# Patient Record
Sex: Male | Born: 1965
Health system: Southern US, Community
[De-identification: ages and names within clinical notes are randomized; demographics above are authoritative.]

## PROBLEM LIST (undated history)

## (undated) DIAGNOSIS — R7989 Other specified abnormal findings of blood chemistry: Secondary | ICD-10-CM

## (undated) DIAGNOSIS — K7581 Nonalcoholic steatohepatitis (NASH): Secondary | ICD-10-CM

## (undated) DIAGNOSIS — D72819 Decreased white blood cell count, unspecified: Secondary | ICD-10-CM

## (undated) DIAGNOSIS — K219 Gastro-esophageal reflux disease without esophagitis: Secondary | ICD-10-CM

## (undated) DIAGNOSIS — E785 Hyperlipidemia, unspecified: Secondary | ICD-10-CM

## (undated) DIAGNOSIS — I10 Essential (primary) hypertension: Secondary | ICD-10-CM

## (undated) DIAGNOSIS — K760 Fatty (change of) liver, not elsewhere classified: Secondary | ICD-10-CM

## (undated) DIAGNOSIS — I2699 Other pulmonary embolism without acute cor pulmonale: Secondary | ICD-10-CM

## (undated) HISTORY — DX: Essential (primary) hypertension: I10

## (undated) HISTORY — DX: Gastro-esophageal reflux disease without esophagitis: K21.9

## (undated) HISTORY — DX: Other specified abnormal findings of blood chemistry: R79.89

## (undated) HISTORY — DX: Fatty (change of) liver, not elsewhere classified: K76.0

## (undated) HISTORY — DX: Nonalcoholic steatohepatitis (NASH): K75.81

## (undated) HISTORY — DX: Hyperlipidemia, unspecified: E78.5

## (undated) HISTORY — DX: Other pulmonary embolism without acute cor pulmonale: I26.99

## (undated) HISTORY — DX: Decreased white blood cell count, unspecified: D72.819

---

## 2009-05-25 DIAGNOSIS — K760 Fatty (change of) liver, not elsewhere classified: Secondary | ICD-10-CM

## 2009-05-25 HISTORY — DX: Fatty (change of) liver, not elsewhere classified: K76.0

## 2011-01-06 DIAGNOSIS — B001 Herpesviral vesicular dermatitis: Secondary | ICD-10-CM | POA: Insufficient documentation

## 2012-07-20 ENCOUNTER — Ambulatory Visit (INDEPENDENT_AMBULATORY_CARE_PROVIDER_SITE_OTHER): Payer: BC Managed Care – PPO | Admitting: Internal Medicine

## 2012-07-20 ENCOUNTER — Encounter: Payer: Self-pay | Admitting: Internal Medicine

## 2012-07-20 VITALS — BP 106/64 | HR 58 | Temp 97.2°F | Resp 16 | Ht 69.0 in | Wt 223.0 lb

## 2012-07-20 DIAGNOSIS — Z23 Encounter for immunization: Secondary | ICD-10-CM

## 2012-07-20 DIAGNOSIS — E785 Hyperlipidemia, unspecified: Secondary | ICD-10-CM | POA: Insufficient documentation

## 2012-07-20 DIAGNOSIS — E7800 Pure hypercholesterolemia, unspecified: Secondary | ICD-10-CM

## 2012-07-20 DIAGNOSIS — I10 Essential (primary) hypertension: Secondary | ICD-10-CM

## 2012-07-20 NOTE — Progress Notes (Signed)
  Subjective:    Patient ID: Willie Foster, male    DOB: 1966-02-17, 47 y.o.   MRN: [id number]  Hypertension This is a chronic problem. The current episode started more than 1 year ago. The problem has been rapidly improving since onset. The problem is controlled. Pertinent negatives include no anxiety, blurred vision, chest pain, headaches, malaise/fatigue, neck pain, orthopnea, palpitations, peripheral edema, PND, shortness of breath or sweats. Past treatments include diuretics and beta blockers. The current treatment provides significant improvement. There are no compliance problems.       Review of Systems  Constitutional: Negative for fever, chills, malaise/fatigue, diaphoresis, activity change, appetite change, fatigue and unexpected weight change.  HENT: Negative.  Negative for neck pain.   Eyes: Negative.  Negative for blurred vision.  Respiratory: Negative for cough, chest tightness, shortness of breath, wheezing and stridor.   Cardiovascular: Negative for chest pain, palpitations, orthopnea and PND.  Gastrointestinal: Negative for nausea, vomiting, abdominal pain, diarrhea and constipation.  Endocrine: Negative.   Genitourinary: Negative.   Musculoskeletal: Negative for myalgias, back pain, joint swelling, arthralgias and gait problem.  Skin: Negative for color change, pallor, rash and wound.  Allergic/Immunologic: Negative.   Neurological: Negative for dizziness, speech difficulty, weakness, light-headedness and headaches.  Hematological: Negative for adenopathy. Does not bruise/bleed easily.  Psychiatric/Behavioral: Negative.        Objective:   Physical Exam  Vitals reviewed. Constitutional: He is oriented to person, place, and time. He appears well-developed and well-nourished. No distress.  HENT:  Head: Normocephalic and atraumatic.  Mouth/Throat: No oropharyngeal exudate.  Eyes: Conjunctivae are normal. Right eye exhibits no discharge. Left eye exhibits no discharge.  No scleral icterus.  Neck: Normal range of motion. Neck supple. No JVD present. No tracheal deviation present. No thyromegaly present.  Cardiovascular: Normal rate, regular rhythm, normal heart sounds and intact distal pulses.  Exam reveals no gallop and no friction rub.   No murmur heard. Pulmonary/Chest: Effort normal and breath sounds normal. No stridor. No respiratory distress. He has no wheezes. He has no rales. He exhibits no tenderness.  Abdominal: Soft. Bowel sounds are normal. He exhibits no distension and no mass. There is no tenderness. There is no rebound and no guarding.  Musculoskeletal: Normal range of motion. He exhibits no edema and no tenderness.  Lymphadenopathy:    He has no cervical adenopathy.  Neurological: He is oriented to person, place, and time.  Skin: Skin is warm and dry. No rash noted. He is not diaphoretic. No erythema. No pallor.  Psychiatric: He has a normal mood and affect. His behavior is normal. Judgment and thought content normal.     No results found for this basename: WBC, HGB, HCT, PLT, GLUCOSE, CHOL, TRIG, HDL, LDLDIRECT, LDLCALC, ALT, AST, NA, K, CL, CREATININE, BUN, CO2, TSH, PSA, INR, GLUF, HGBA1C, MICROALBUR       Assessment & Plan:

## 2012-07-20 NOTE — Assessment & Plan Note (Signed)
His BP is well controlled 

## 2012-07-20 NOTE — Patient Instructions (Signed)

## 2012-07-20 NOTE — Assessment & Plan Note (Signed)
He is doing well on lipitor

## 2012-09-15 ENCOUNTER — Ambulatory Visit (INDEPENDENT_AMBULATORY_CARE_PROVIDER_SITE_OTHER): Payer: BC Managed Care – PPO | Admitting: Internal Medicine

## 2012-09-15 ENCOUNTER — Other Ambulatory Visit (INDEPENDENT_AMBULATORY_CARE_PROVIDER_SITE_OTHER): Payer: BC Managed Care – PPO

## 2012-09-15 ENCOUNTER — Encounter: Payer: Self-pay | Admitting: Internal Medicine

## 2012-09-15 VITALS — BP 104/70 | HR 58 | Temp 97.8°F | Resp 16 | Ht 70.0 in | Wt 229.0 lb

## 2012-09-15 DIAGNOSIS — T502X5A Adverse effect of carbonic-anhydrase inhibitors, benzothiadiazides and other diuretics, initial encounter: Secondary | ICD-10-CM

## 2012-09-15 DIAGNOSIS — K921 Melena: Secondary | ICD-10-CM

## 2012-09-15 DIAGNOSIS — I10 Essential (primary) hypertension: Secondary | ICD-10-CM

## 2012-09-15 DIAGNOSIS — E7800 Pure hypercholesterolemia, unspecified: Secondary | ICD-10-CM

## 2012-09-15 DIAGNOSIS — N400 Enlarged prostate without lower urinary tract symptoms: Secondary | ICD-10-CM

## 2012-09-15 DIAGNOSIS — R079 Chest pain, unspecified: Secondary | ICD-10-CM

## 2012-09-15 DIAGNOSIS — E876 Hypokalemia: Secondary | ICD-10-CM

## 2012-09-15 LAB — URINALYSIS, ROUTINE W REFLEX MICROSCOPIC
Bilirubin Urine: NEGATIVE
Ketones, ur: NEGATIVE
Nitrite: NEGATIVE
Total Protein, Urine: NEGATIVE
pH: 6 (ref 5.0–8.0)

## 2012-09-15 LAB — CBC WITH DIFFERENTIAL/PLATELET
Basophils Absolute: 0 10*3/uL (ref 0.0–0.1)
Eosinophils Relative: 1.5 % (ref 0.0–5.0)
HCT: 43.5 % (ref 39.0–52.0)
Hemoglobin: 14.7 g/dL (ref 13.0–17.0)
Lymphocytes Relative: 32.9 % (ref 12.0–46.0)
Lymphs Abs: 1.6 10*3/uL (ref 0.7–4.0)
Monocytes Relative: 13.5 % — ABNORMAL HIGH (ref 3.0–12.0)
Neutro Abs: 2.5 10*3/uL (ref 1.4–7.7)
RBC: 5.52 Mil/uL (ref 4.22–5.81)
WBC: 4.7 10*3/uL (ref 4.5–10.5)

## 2012-09-15 LAB — COMPREHENSIVE METABOLIC PANEL
BUN: 17 mg/dL (ref 6–23)
CO2: 35 mEq/L — ABNORMAL HIGH (ref 19–32)
Calcium: 9.5 mg/dL (ref 8.4–10.5)
Chloride: 98 mEq/L (ref 96–112)
Creatinine, Ser: 1 mg/dL (ref 0.4–1.5)
GFR: 80.77 mL/min (ref >60.00)
Glucose, Bld: 85 mg/dL (ref 70–99)
Total Bilirubin: 0.6 mg/dL (ref 0.3–1.2)

## 2012-09-15 LAB — PSA: PSA: 0.68 ng/mL (ref 0.10–4.00)

## 2012-09-15 LAB — CARDIAC PANEL
CK-MB: 1.9 ng/mL (ref 0.3–4.0)
Relative Index: 0.7 calc (ref 0.0–2.5)

## 2012-09-15 LAB — LIPID PANEL
Cholesterol: 163 mg/dL (ref 0–200)
HDL: 46.1 mg/dL (ref >39.00)
LDL Cholesterol: 79 mg/dL (ref 0–99)
VLDL: 37.6 mg/dL (ref 0.0–40.0)

## 2012-09-15 LAB — TSH: TSH: 0.8 u[IU]/mL (ref 0.35–5.50)

## 2012-09-15 NOTE — Assessment & Plan Note (Signed)
GI referral (? The need for colonoscopy)

## 2012-09-15 NOTE — Progress Notes (Signed)
Subjective:    Patient ID: Willie Foster, male    DOB: 08-Jun-1965, 47 y.o.   MRN: [id number]  Chest Pain  This is a new problem. Episode onset: one week ago. The onset quality is gradual. The problem occurs intermittently. The problem has been unchanged. The pain is present in the lateral region (right lower anterior chest wall area). The pain is at a severity of 1/10. The pain is mild. The quality of the pain is described as dull. The pain does not radiate. Pertinent negatives include no abdominal pain, back pain, claudication, cough, diaphoresis, dizziness, exertional chest pressure, fever, headaches, hemoptysis, irregular heartbeat, leg pain, lower extremity edema, malaise/fatigue, nausea, near-syncope, numbness, orthopnea, palpitations, PND, shortness of breath, sputum production, syncope, vomiting or weakness. The pain is aggravated by nothing. He has tried nothing for the symptoms.      Review of Systems  Constitutional: Negative.  Negative for fever, chills, malaise/fatigue, diaphoresis, activity change, appetite change, fatigue and unexpected weight change.  HENT: Negative.   Eyes: Negative.   Respiratory: Negative.  Negative for cough, hemoptysis, sputum production, choking, chest tightness, shortness of breath, wheezing and stridor.   Cardiovascular: Positive for chest pain. Negative for palpitations, orthopnea, claudication, leg swelling, syncope, PND and near-syncope.  Gastrointestinal: Positive for anal bleeding. Negative for nausea, vomiting, abdominal pain, diarrhea, constipation, abdominal distention and rectal pain.  Endocrine: Negative.   Genitourinary: Negative.  Negative for hematuria, penile swelling, scrotal swelling and testicular pain.  Musculoskeletal: Negative.  Negative for myalgias, back pain, joint swelling and gait problem.  Skin: Negative.   Allergic/Immunologic: Negative.   Neurological: Negative.  Negative for dizziness, syncope, weakness, light-headedness,  numbness and headaches.  Hematological: Negative.  Negative for adenopathy. Does not bruise/bleed easily.  Psychiatric/Behavioral: Negative.        Objective:   Physical Exam  Vitals reviewed. Constitutional: He is oriented to person, place, and time. He appears well-developed and well-nourished. No distress.  HENT:  Head: Normocephalic and atraumatic.  Mouth/Throat: Oropharynx is clear and moist. No oropharyngeal exudate.  Eyes: Conjunctivae are normal. Right eye exhibits no discharge. Left eye exhibits no discharge. No scleral icterus.  Neck: Normal range of motion. Neck supple. No JVD present. No tracheal deviation present. No thyromegaly present.  Cardiovascular: Normal rate, regular rhythm, normal heart sounds and intact distal pulses.  Exam reveals no gallop and no friction rub.   No murmur heard. Pulmonary/Chest: Effort normal and breath sounds normal. No accessory muscle usage or stridor. Not tachypneic. No respiratory distress. He has no decreased breath sounds. He has no wheezes. He has no rales. Chest wall is not dull to percussion. He exhibits no mass, no tenderness, no bony tenderness, no laceration, no crepitus, no edema, no deformity, no swelling and no retraction. Right breast exhibits no inverted nipple, no mass, no nipple discharge, no skin change and no tenderness. Left breast exhibits no mass, no nipple discharge, no skin change and no tenderness. Breasts are symmetrical.  Abdominal: Soft. Bowel sounds are normal. He exhibits no distension and no mass. There is no tenderness. There is no rebound and no guarding. Hernia confirmed negative in the right inguinal area and confirmed negative in the left inguinal area.  Genitourinary: Rectum normal, testes normal and penis normal. Rectal exam shows no external hemorrhoid, no internal hemorrhoid, no fissure, no mass, no tenderness and anal tone normal. Guaiac negative stool. Prostate is enlarged (1+ smooth symm BPH). Prostate is not  tender. Right testis shows no mass, no swelling and no  tenderness. Right testis is descended. Left testis shows no mass, no swelling and no tenderness. Left testis is descended. Circumcised. No phimosis, paraphimosis, hypospadias, penile erythema or penile tenderness. No discharge found.  Musculoskeletal: Normal range of motion. He exhibits no edema and no tenderness.  Lymphadenopathy:    He has no cervical adenopathy.       Right: No inguinal adenopathy present.       Left: No inguinal adenopathy present.  Neurological: He is oriented to person, place, and time.  Skin: Skin is warm and dry. No rash noted. He is not diaphoretic. No erythema. No pallor.  Psychiatric: He has a normal mood and affect. His behavior is normal. Judgment and thought content normal.     No results found for this basename: WBC, HGB, HCT, PLT, GLUCOSE, CHOL, TRIG, HDL, LDLDIRECT, LDLCALC, ALT, AST, NA, K, CL, CREATININE, BUN, CO2, TSH, PSA, INR, GLUF, HGBA1C, MICROALBUR       Assessment & Plan:

## 2012-09-15 NOTE — Assessment & Plan Note (Signed)
His pain is atypical The EKG shows some artifact but it is otherwise normal I will check some cardiac enzymes today

## 2012-09-15 NOTE — Assessment & Plan Note (Signed)
FLP and CMP today

## 2012-09-15 NOTE — Patient Instructions (Addendum)
Chest Pain (Nonspecific) It is often hard to give a specific diagnosis for the cause of chest pain. There is always a chance that your pain could be related to something serious, such as a heart attack or a blood clot in the lungs. You need to follow up with your caregiver for further evaluation. CAUSES   Heartburn.  Pneumonia or bronchitis.  Anxiety or stress.  Inflammation around your heart (pericarditis) or lung (pleuritis or pleurisy).  A blood clot in the lung.  A collapsed lung (pneumothorax). It can develop suddenly on its own (spontaneous pneumothorax) or from injury (trauma) to the chest.  Shingles infection (herpes zoster virus). The chest wall is composed of bones, muscles, and cartilage. Any of these can be the source of the pain.  The bones can be bruised by injury.  The muscles or cartilage can be strained by coughing or overwork.  The cartilage can be affected by inflammation and become sore (costochondritis). DIAGNOSIS  Lab tests or other studies, such as X-rays, electrocardiography, stress testing, or cardiac imaging, may be needed to find the cause of your pain.  TREATMENT   Treatment depends on what may be causing your chest pain. Treatment may include:  Acid blockers for heartburn.  Anti-inflammatory medicine.  Pain medicine for inflammatory conditions.  Antibiotics if an infection is present.  You may be advised to change lifestyle habits. This includes stopping smoking and avoiding alcohol, caffeine, and chocolate.  You may be advised to keep your head raised (elevated) when sleeping. This reduces the chance of acid going backward from your stomach into your esophagus.  Most of the time, nonspecific chest pain will improve within 2 to 3 days with rest and mild pain medicine. HOME CARE INSTRUCTIONS   If antibiotics were prescribed, take your antibiotics as directed. Finish them even if you start to feel better.  For the next few days, avoid physical  activities that bring on chest pain. Continue physical activities as directed.  Do not smoke.  Avoid drinking alcohol.  Only take over-the-counter or prescription medicine for pain, discomfort, or fever as directed by your caregiver.  Follow your caregiver's suggestions for further testing if your chest pain does not go away.  Keep any follow-up appointments you made. If you do not go to an appointment, you could develop lasting (chronic) problems with pain. If there is any problem keeping an appointment, you must call to reschedule. SEEK MEDICAL CARE IF:   You think you are having problems from the medicine you are taking. Read your medicine instructions carefully.  Your chest pain does not go away, even after treatment.  You develop a rash with blisters on your chest. SEEK IMMEDIATE MEDICAL CARE IF:   You have increased chest pain or pain that spreads to your arm, neck, jaw, back, or abdomen.  You develop shortness of breath, an increasing cough, or you are coughing up blood.  You have severe back or abdominal pain, feel nauseous, or vomit.  You develop severe weakness, fainting, or chills.  You have a fever. THIS IS AN EMERGENCY. Do not wait to see if the pain will go away. Get medical help at once. Call your local emergency services (911 in [place] Do not drive yourself to the hospital. MAKE SURE YOU:   Understand these instructions.  Will watch your condition.  Will get help right away if you are not doing well or get worse. Document Released: 02/18/2005 Document Revised: 08/03/2011 Document Reviewed: 12/15/2007 [employer] Patient Information 2013 [employer].  

## 2012-09-15 NOTE — Assessment & Plan Note (Signed)
PSA today

## 2012-09-15 NOTE — Assessment & Plan Note (Signed)
His BP is well controlled Today I will check his lytes and renal function 

## 2012-09-16 ENCOUNTER — Telehealth: Payer: Self-pay | Admitting: Internal Medicine

## 2012-09-16 ENCOUNTER — Encounter: Payer: Self-pay | Admitting: Internal Medicine

## 2012-09-16 DIAGNOSIS — T502X5A Adverse effect of carbonic-anhydrase inhibitors, benzothiadiazides and other diuretics, initial encounter: Secondary | ICD-10-CM | POA: Insufficient documentation

## 2012-09-16 DIAGNOSIS — E876 Hypokalemia: Secondary | ICD-10-CM | POA: Insufficient documentation

## 2012-09-16 MED ORDER — POTASSIUM CHLORIDE CRYS ER 20 MEQ PO TBCR: 20.0000 meq | EXTENDED_RELEASE_TABLET | Freq: Two times a day (BID) | ORAL | Status: DC

## 2012-09-16 NOTE — Telephone Encounter (Signed)
Caller: [place]: Randell Loop Patient: Willie, Foster DOB: [birth year] Phone: [phone] Reason for Call: Willie Foster is calling from [place] regarding a Troponin I ordered on Willie, Foster by Dr. Marcello Moores Jones4/24/2014 3:22:00 PM. The results were Normal 0.01.

## 2012-09-16 NOTE — Addendum Note (Signed)
Addended by: [name] on: 09/16/2012 07:45 AM   Modules accepted: Orders, Medications

## 2012-09-20 ENCOUNTER — Other Ambulatory Visit: Payer: Self-pay | Admitting: Internal Medicine

## 2012-09-20 DIAGNOSIS — E7800 Pure hypercholesterolemia, unspecified: Secondary | ICD-10-CM

## 2012-09-20 DIAGNOSIS — I10 Essential (primary) hypertension: Secondary | ICD-10-CM

## 2012-10-23 DIAGNOSIS — I2699 Other pulmonary embolism without acute cor pulmonale: Secondary | ICD-10-CM

## 2012-10-23 HISTORY — DX: Other pulmonary embolism without acute cor pulmonale: I26.99

## 2012-11-13 ENCOUNTER — Emergency Department (HOSPITAL_COMMUNITY)
Admission: EM | Admit: 2012-11-13 | Discharge: 2012-11-13 | Disposition: A | Discharge status: Nursing Home (Includes ICF) | Payer: BC Managed Care – PPO | Source: Home / Self Care | Attending: Family Medicine | Admitting: Family Medicine

## 2012-11-13 ENCOUNTER — Encounter (HOSPITAL_COMMUNITY): Payer: Self-pay | Admitting: *Deleted

## 2012-11-13 ENCOUNTER — Encounter (HOSPITAL_COMMUNITY): Payer: Self-pay | Admitting: Nurse Practitioner

## 2012-11-13 ENCOUNTER — Emergency Department (HOSPITAL_COMMUNITY): Payer: BC Managed Care – PPO

## 2012-11-13 ENCOUNTER — Emergency Department (INDEPENDENT_AMBULATORY_CARE_PROVIDER_SITE_OTHER): Payer: BC Managed Care – PPO

## 2012-11-13 ENCOUNTER — Inpatient Hospital Stay (HOSPITAL_COMMUNITY)
Admission: EM | Admit: 2012-11-13 | Discharge: 2012-11-15 | DRG: 078 | Disposition: A | Discharge status: Home / Self Care | Payer: BC Managed Care – PPO | Attending: Internal Medicine | Admitting: Internal Medicine

## 2012-11-13 DIAGNOSIS — E785 Hyperlipidemia, unspecified: Secondary | ICD-10-CM | POA: Diagnosis present

## 2012-11-13 DIAGNOSIS — R0789 Other chest pain: Secondary | ICD-10-CM

## 2012-11-13 DIAGNOSIS — I2699 Other pulmonary embolism without acute cor pulmonale: Principal | ICD-10-CM

## 2012-11-13 DIAGNOSIS — T502X5A Adverse effect of carbonic-anhydrase inhibitors, benzothiadiazides and other diuretics, initial encounter: Secondary | ICD-10-CM | POA: Diagnosis present

## 2012-11-13 DIAGNOSIS — N400 Enlarged prostate without lower urinary tract symptoms: Secondary | ICD-10-CM | POA: Diagnosis present

## 2012-11-13 DIAGNOSIS — E876 Hypokalemia: Secondary | ICD-10-CM

## 2012-11-13 DIAGNOSIS — E7800 Pure hypercholesterolemia, unspecified: Secondary | ICD-10-CM

## 2012-11-13 DIAGNOSIS — K7689 Other specified diseases of liver: Secondary | ICD-10-CM | POA: Diagnosis present

## 2012-11-13 DIAGNOSIS — R079 Chest pain, unspecified: Secondary | ICD-10-CM | POA: Diagnosis present

## 2012-11-13 DIAGNOSIS — I10 Essential (primary) hypertension: Secondary | ICD-10-CM

## 2012-11-13 LAB — BASIC METABOLIC PANEL
Calcium: 10.5 mg/dL (ref 8.4–10.5)
Creatinine, Ser: 1.22 mg/dL (ref 0.50–1.35)
GFR calc Af Amer: 79 mL/min — ABNORMAL LOW (ref >90)

## 2012-11-13 LAB — CBC
MCH: 26.8 pg (ref 26.0–34.0)
MCV: 75.9 fL — ABNORMAL LOW (ref 78.0–100.0)
Platelets: 160 10*3/uL (ref 150–400)
RDW: 14 % (ref 11.5–15.5)

## 2012-11-13 LAB — PRO B NATRIURETIC PEPTIDE: Pro B Natriuretic peptide (BNP): 32.2 pg/mL (ref 0–125)

## 2012-11-13 MED ORDER — ATENOLOL 50 MG PO TABS
50.0000 mg | ORAL_TABLET | Freq: Every day | ORAL | Status: DC
  Administered 2012-11-14 – 2012-11-15 (×2): 50 mg via ORAL
  Filled 2012-11-13 (×2): qty 1

## 2012-11-13 MED ORDER — ATENOLOL-CHLORTHALIDONE 50-25 MG PO TABS: 1.0000 | ORAL_TABLET | Freq: Every day | ORAL | Status: DC

## 2012-11-13 MED ORDER — CHLORTHALIDONE 25 MG PO TABS: 25.0000 mg | ORAL_TABLET | Freq: Every day | ORAL | Status: DC

## 2012-11-13 MED ORDER — ACETAMINOPHEN 325 MG PO TABS
650.0000 mg | ORAL_TABLET | Freq: Four times a day (QID) | ORAL | Status: DC | PRN
  Administered 2012-11-13: 650 mg via ORAL
  Filled 2012-11-13: qty 2

## 2012-11-13 MED ORDER — IOHEXOL 350 MG/ML SOLN
80.0000 mL | Freq: Once | INTRAVENOUS | Status: AC | PRN
  Administered 2012-11-13: 80 mL via INTRAVENOUS

## 2012-11-13 MED ORDER — ONDANSETRON HCL 4 MG/2ML IJ SOLN: 4.0000 mg | Freq: Four times a day (QID) | INTRAMUSCULAR | Status: DC | PRN

## 2012-11-13 MED ORDER — HEPARIN BOLUS VIA INFUSION
6000.0000 [IU] | Freq: Once | INTRAVENOUS | Status: AC
  Administered 2012-11-13: 6000 [IU] via INTRAVENOUS

## 2012-11-13 MED ORDER — POTASSIUM CHLORIDE CRYS ER 20 MEQ PO TBCR
20.0000 meq | EXTENDED_RELEASE_TABLET | Freq: Two times a day (BID) | ORAL | Status: DC
  Administered 2012-11-13: 20 meq via ORAL
  Filled 2012-11-13: qty 1

## 2012-11-13 MED ORDER — HEPARIN (PORCINE) IN NACL 100-0.45 UNIT/ML-% IJ SOLN
1400.0000 [IU]/h | INTRAMUSCULAR | Status: AC
  Administered 2012-11-13: 1600 [IU]/h via INTRAVENOUS
  Administered 2012-11-14: 1400 [IU]/h via INTRAVENOUS
  Filled 2012-11-13 (×3): qty 250

## 2012-11-13 MED ORDER — LORATADINE 10 MG PO TABS
10.0000 mg | ORAL_TABLET | Freq: Every day | ORAL | Status: DC
  Administered 2012-11-14 – 2012-11-15 (×2): 10 mg via ORAL
  Filled 2012-11-13 (×2): qty 1

## 2012-11-13 MED ORDER — ONDANSETRON HCL 4 MG PO TABS: 4.0000 mg | ORAL_TABLET | Freq: Four times a day (QID) | ORAL | Status: DC | PRN

## 2012-11-13 MED ORDER — ACETAMINOPHEN 650 MG RE SUPP: 650.0000 mg | Freq: Four times a day (QID) | RECTAL | Status: DC | PRN

## 2012-11-13 MED ORDER — SODIUM CHLORIDE 0.9 % IV SOLN
INTRAVENOUS | Status: AC
  Administered 2012-11-14: 10:00:00 via INTRAVENOUS

## 2012-11-13 MED ORDER — SODIUM CHLORIDE 0.9 % IJ SOLN
3.0000 mL | Freq: Two times a day (BID) | INTRAMUSCULAR | Status: DC
  Administered 2012-11-15: 3 mL via INTRAVENOUS

## 2012-11-13 NOTE — ED Provider Notes (Signed)
History     CSN: [id number]  Arrival date & time 11/13/12  1144   None     Chief Complaint  Patient presents with  . Muscle Pain  . Chest Pain    (Consider location/radiation/quality/duration/timing/severity/associated sxs/prior treatment) Patient is a 47 y.o. male presenting with chest pain. The history is provided by the patient.  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 Hyperlipidemia   . Hypertension   . Fatty liver disease, nonalcoholic 8527    History reviewed. No pertinent past surgical history.  Family History  Problem Relation Age of Onset  . Cancer Paternal Grandfather     Breast and Lung Cancer  . Hypertension Father   . Alcohol abuse Neg Hx   . COPD Neg Hx   . Diabetes Neg Hx   . Drug abuse Neg Hx   . Early death Neg Hx   . Heart disease Neg Hx   . Hyperlipidemia Neg Hx   . Kidney disease Neg Hx   . Stroke Neg Hx     History  Substance Use Topics  . Smoking status: Never Smoker   . Smokeless tobacco: Never Used  . Alcohol Use: 3.0 oz/week    2 Glasses of wine, 2 Cans of beer, 1 Shots of liquor per week      Review of Systems  Constitutional: Negative.   Respiratory: Negative for cough, chest tightness and shortness of breath.   Cardiovascular: Positive for chest pain. Negative for palpitations and leg swelling.  Gastrointestinal: Negative.     Allergies  Lipitor  Home Medications   Current Outpatient Rx  Name  Route  Sig  Dispense  Refill  . atenolol-chlorthalidone (TENORETIC) 50-25 MG per tablet   Oral   Take 1 tablet by mouth daily.        . potassium chloride SA (K-DUR,KLOR-CON) 20 MEQ tablet   Oral   Take 1 tablet (20 mEq total) by mouth 2 (two) times daily.   60 tablet   5   . acyclovir (ZOVIRAX) 400 MG tablet   Oral   Take 1 tablet by mouth 3 (three) times daily as needed.           BP 114/81  Pulse 67  Temp(Src) 98 F (36.7 C) (Oral)  Resp 18  SpO2 96%  Physical Exam  Nursing note and vitals reviewed. Constitutional: He is oriented to person, place, and time. He appears well-developed and well-nourished.  Neck: Normal range of motion. Neck supple.  Cardiovascular: Normal rate, regular rhythm, normal heart sounds and intact distal pulses.   Pulmonary/Chest: Effort normal and breath sounds normal. He exhibits tenderness.  Abdominal: Soft. Bowel sounds are normal.  Lymphadenopathy:    He has no cervical adenopathy.  Neurological: He is alert and oriented to person, place, and time.  Skin: Skin is warm and dry.    ED Course  Procedures (including critical care time)  Labs Reviewed - No data to display Dg Chest 2 View  11/13/2012   *RADIOLOGY REPORT*  Clinical Data: Right-sided chest  pain  CHEST - 2 VIEW  Comparison: None  Findings: Normal heart size.  Decreased lung volumes.  No pleural effusion identified.  There is atelectasis involving the both lung bases right greater than left.  Right middle lobe infiltrate is also suspected based upon opacity seen on the lateral radiograph.  IMPRESSION:  1.  Lungs are suboptimally inflated. 2.  Bibasilar atelectasis. 3.  Based on the lateral radiograph I suspect there may be a right middle lobe infiltrate.   Original Report Authenticated By: Kerby Moors, M.D.     1. Chest pain, atypical       MDM  X-rays reviewed and report per radiologist.  Sent for eval of right sided cp and abnl cxr, r/o PE, inconsistent with infectious process.         Billy Fischer, MD 11/13/12 1328

## 2012-11-13 NOTE — ED Provider Notes (Signed)
History     CSN: [id number]  Arrival date & time 11/13/12  1344   None     Chief Complaint  Patient presents with  . Chest Pain    (Consider location/radiation/quality/duration/timing/severity/associated sxs/prior treatment) Patient is a 47 y.o. male presenting with chest pain.  Chest Pain Pain location:  Substernal area Pain quality: sharp   Pain radiates to:  R shoulder Pain radiates to the back: yes   Pain severity:  Moderate Onset quality:  Gradual Duration:  2 days Timing:  Constant Progression:  Unchanged Chronicity:  Recurrent Context: breathing and at rest   Context: not lifting and no movement   Relieved by:  Antacids (NSAIDs) Worsened by:  Nothing tried Associated symptoms: shortness of breath   Associated symptoms: no abdominal pain, no anxiety, no back pain, no cough, no dizziness, no fever, no headache, no heartburn, no lower extremity edema, no nausea, no orthopnea, no palpitations, no PND and not vomiting   Risk factors: high cholesterol and hypertension     Past Medical History  Diagnosis Date  . Hyperlipidemia   . Hypertension   . Fatty liver disease, nonalcoholic 1093    History reviewed. No pertinent past surgical history.  Family History  Problem Relation Age of Onset  . Cancer Paternal Grandfather     Breast and Lung Cancer  . Hypertension Father   . Alcohol abuse Neg Hx   . COPD Neg Hx   . Diabetes Neg Hx   . Drug abuse Neg Hx   . Early death Neg Hx   . Heart disease Neg Hx   . Hyperlipidemia Neg Hx   . Kidney disease Neg Hx   . Stroke Neg Hx     History  Substance Use Topics  . Smoking status: Never Smoker   . Smokeless tobacco: Never Used  . Alcohol Use: 3.0 oz/week    2 Glasses of wine, 2 Cans of beer, 1 Shots of liquor per week      Review of Systems  Constitutional: Negative for fever and chills.  HENT: Negative for congestion, sore throat and rhinorrhea.   Eyes: Negative for photophobia and visual disturbance.   Respiratory: Positive for shortness of breath. Negative for cough.   Cardiovascular: Positive for chest pain. Negative for palpitations, orthopnea, leg swelling and PND.  Gastrointestinal: Negative for heartburn, nausea, vomiting, abdominal pain, diarrhea and constipation.  Endocrine: Negative for polydipsia and polyuria.  Genitourinary: Negative for dysuria and hematuria.  Musculoskeletal: Negative for back pain and arthralgias.  Skin: Negative for color change and rash.  Neurological: Negative for dizziness, syncope, light-headedness and headaches.  Hematological: Negative for adenopathy. Does not bruise/bleed easily.  All other systems reviewed and are negative.    Allergies  Lipitor  Home Medications   Current Outpatient Rx  Name  Route  Sig  Dispense  Refill  . acyclovir (ZOVIRAX) 400 MG tablet   Oral   Take 1 tablet by mouth 3 (three) times daily as needed (fever blisters).          Marland Kitchen atenolol-chlorthalidone (TENORETIC) 50-25 MG per tablet   Oral   Take 1 tablet by mouth daily.         . Cetirizine HCl (ZYRTEC ALLERGY) 10 MG CAPS   Oral   Take 10 mg by mouth daily.         . naproxen sodium (ANAPROX) 220 MG tablet   Oral   Take 440 mg by mouth every 6 (six) hours as needed (pain).         Marland Kitchen  potassium chloride SA (K-DUR,KLOR-CON) 20 MEQ tablet   Oral   Take 1 tablet (20 mEq total) by mouth 2 (two) times daily.   60 tablet   5     BP 135/62  Pulse 79  Temp(Src) 98.1 F (36.7 C) (Oral)  Resp 16  Ht 5' 9"  (1.753 m)  Wt 215 lb (97.523 kg)  BMI 31.74 kg/m2  SpO2 98%  Physical Exam  Vitals reviewed. Constitutional: He is oriented to person, place, and time. He appears well-developed and well-nourished.  HENT:  Head: Normocephalic and atraumatic.  Eyes: Conjunctivae and EOM are normal.  Neck: Normal range of motion. Neck supple.  Cardiovascular: Normal rate, regular rhythm and normal heart sounds.   Pulmonary/Chest: Effort normal and breath sounds  normal. No respiratory distress. He exhibits no tenderness.  Abdominal: He exhibits no distension. There is no tenderness. There is no rebound and no guarding.  Musculoskeletal: Normal range of motion.  Neurological: He is alert and oriented to person, place, and time.  Skin: Skin is warm and dry.    ED Course  Procedures (including critical care time)  Labs Reviewed  CBC - Abnormal; Notable for the following:    MCV 75.9 (*)    All other components within normal limits  BASIC METABOLIC PANEL - Abnormal; Notable for the following:    Potassium 3.4 (*)    CO2 35 (*)    GFR calc non Af Amer 69 (*)    GFR calc Af Amer 79 (*)    All other components within normal limits  D-DIMER, QUANTITATIVE - Abnormal; Notable for the following:    D-Dimer, Quant 1.24 (*)    All other components within normal limits  PRO B NATRIURETIC PEPTIDE  PROTIME-INR  HEPARIN LEVEL (UNFRACTIONATED)  CBC  POCT I-STAT TROPONIN I   Dg Chest 2 View  11/13/2012   *RADIOLOGY REPORT*  Clinical Data: Chest pain, cough, congestion and shortness of breath.  CHEST - 2 VIEW  Comparison: 11/13/2012  Findings: There is atelectasis at both lung bases, right greater than left.  No focal infiltrate, pulmonary edema or pleural fluid is identified.  Cardiac and mediastinal contours within normal limits.  Bony thorax is unremarkable.  IMPRESSION: Bibasilar atelectasis.  No acute findings.   Original Report Authenticated By: Aletta Edouard, M.D.   Dg Chest 2 View  11/13/2012   *RADIOLOGY REPORT*  Clinical Data: Right-sided chest pain  CHEST - 2 VIEW  Comparison: None  Findings: Normal heart size.  Decreased lung volumes.  No pleural effusion identified.  There is atelectasis involving the both lung bases right greater than left.  Right middle lobe infiltrate is also suspected based upon opacity seen on the lateral radiograph.  IMPRESSION:  1.  Lungs are suboptimally inflated. 2.  Bibasilar atelectasis. 3.  Based on the lateral  radiograph I suspect there may be a right middle lobe infiltrate.   Original Report Authenticated By: Kerby Moors, M.D.   Ct Angio Chest W/cm &/or Wo Cm  11/13/2012   *RADIOLOGY REPORT*  Clinical Data: Pleuritic chest pain.  Rule out pulmonary embolism.  CT ANGIOGRAPHY CHEST  Technique:  Multidetector CT imaging of the chest using the standard protocol during bolus administration of intravenous contrast. Multiplanar reconstructed images including MIPs were obtained and reviewed to evaluate the vascular anatomy.  Contrast: 69m OMNIPAQUE IOHEXOL 350 MG/ML SOLN  Comparison: Chest x-ray 11/13/2012  Findings: Positive for pulmonary embolism right middle lobe and right lower lobe.  There is infiltrate in the right  middle lobe which may be pulmonary infarction.  No emboli in the left lung.  There is bibasilar atelectasis right greater than left.  Small right pleural effusion.  Negative for aortic dissection or aneurysm.  Heart size is upper normal.  No significant coronary artery calcification is identified.  No mass or adenopathy.  No acute bony abnormality.  IMPRESSION: Pulmonary embolism right middle lobe and right lower lobe pulmonary arteries.  Probable infarct in the right middle lobe.  Bibasilar atelectasis and small right pleural effusion.   Original Report Authenticated By: Carl Best, M.D.   Results for orders placed during the hospital encounter of 11/13/12  CBC      Result Value Range   WBC 7.1  4.0 - 10.5 K/uL   RBC 5.57  4.22 - 5.81 MIL/uL   Hemoglobin 14.9  13.0 - 17.0 g/dL   HCT 42.3  39.0 - 52.0 %   MCV 75.9 (*) 78.0 - 100.0 fL   MCH 26.8  26.0 - 34.0 pg   MCHC 35.2  30.0 - 36.0 g/dL   RDW 14.0  11.5 - 15.5 %   Platelets 160  150 - 400 K/uL  BASIC METABOLIC PANEL      Result Value Range   Sodium 142  135 - 145 mEq/L   Potassium 3.4 (*) 3.5 - 5.1 mEq/L   Chloride 98  96 - 112 mEq/L   CO2 35 (*) 19 - 32 mEq/L   Glucose, Bld 84  70 - 99 mg/dL   BUN 13  6 - 23 mg/dL   Creatinine,  Ser 1.22  0.50 - 1.35 mg/dL   Calcium 10.5  8.4 - 10.5 mg/dL   GFR calc non Af Amer 69 (*) >90 mL/min   GFR calc Af Amer 79 (*) >90 mL/min  PRO B NATRIURETIC PEPTIDE      Result Value Range   Pro B Natriuretic peptide (BNP) 32.2  0 - 125 pg/mL  D-DIMER, QUANTITATIVE      Result Value Range   D-Dimer, Quant 1.24 (*) 0.00 - 0.48 ug/mL-FEU  PROTIME-INR      Result Value Range   Prothrombin Time 13.1  11.6 - 15.2 seconds   INR 1.00  0.00 - 1.49  POCT I-STAT TROPONIN I      Result Value Range   Troponin i, poc 0.00  0.00 - 0.08 ng/mL   Comment 3               1. Pulmonary embolism   2. Essential hypertension, benign      Date: 11/13/2012  Rate: 65  Rhythm: normal sinus rhythm  QRS Axis: normal  Intervals: normal  ST/T Wave abnormalities: nonspecific T wave changes  Conduction Disutrbances:none  Narrative Interpretation:  NSR with t wave inversions in 3 and avf, avf new compared to last in 08/2012  Old EKG Reviewed: changes noted    MDM   47 y.o. male  with pertinent PMH of HTN, HLD, family ho CAD presents with pleuritic chest pain for 2 days.  Seen by urgent care today for same with CXR demonstrating likely RML infiltrate.  Given pleuritic nature and infiltrate, sent here for ro PE.  History and physical exam as above.  Labs with positive ddimer, negative trop.  CT scan revealed segmental R middle and lower lobe PE.  Pt placed on heparin, admitted to Medicine.   Labs and imaging as above reviewed by myself and attending,Dr. Lita Mains, with whom case was discussed.   1. Pulmonary  embolism   2. Essential hypertension, benign             Rexene Agent, MD 11/13/12 2126

## 2012-11-13 NOTE — Progress Notes (Signed)
ANTICOAGULATION CONSULT NOTE - Initial Consult  Pharmacy Consult for heparin  Indication: pulmonary embolus  Allergies  Allergen Reactions  . Lipitor (Atorvastatin)     Elevated LFT's    Patient Measurements: Height: 5' 9"  (175.3 cm) Weight: 215 lb (97.523 kg) IBW/kg (Calculated) : 70.7 Heparin Dosing Weight: 91 kg  Vital Signs: Temp: 98 F (36.7 C) (06/22 1353) Temp src: Oral (06/22 1353) BP: 118/73 mmHg (06/22 1820) Pulse Rate: 68 (06/22 1820)  Labs:  Recent Labs  11/13/12 1414  HGB 14.9  HCT 42.3  PLT 160  CREATININE 1.22    Estimated Creatinine Clearance: 85.3 ml/min (by C-G formula based on Cr of 1.22).   Medical History: Past Medical History  Diagnosis Date  . Hyperlipidemia   . Hypertension   . Fatty liver disease, nonalcoholic 6151    Medications:  See PTA med list  Assessment: 47 yo M admitted with CP - R-sided PE, confirmed by CT.  D-dimer noted to be elevated at 1.24.  No anticoagulants noted prior to arrival.  CBC ok at baseline; platelets noted to be low at 160.  Goal of Therapy:  Heparin level 0.3-0.7 units/ml Monitor platelets by anticoagulation protocol: Yes   Plan:  - Heparin 6000 unit bolus - Heparin 1600 units/hr - f/u 6h heparin level - Check daily heparin level and CBC - PT/INR now  Bryson Ha L. Amada Jupiter, PharmD, [facility] Clinical Pharmacist Pager: [phone] Pharmacy: [phone] 11/13/2012 7:17 PM

## 2012-11-13 NOTE — ED Notes (Signed)
Return from xray

## 2012-11-13 NOTE — ED Notes (Signed)
Patient states he woke up on 11/12/2012 with right side chest pain radiating toward back.  Patient states pain is worse with a deep breath or movement of right arm.  Patient states similar episode in April of this year.  He was seen by his physician and had EKG, which was normal.  Dr Juventino Slovak in during triage.

## 2012-11-13 NOTE — ED Notes (Signed)
Pt reports he woke yesterday and noticed pain in his chest, began to radiate into lower back last night before bed, woke this am and pain was worse in his chest. Denies SOB, nausea or diaphoresis. Pain increased with inspriation. A&Ox4, resp e/u

## 2012-11-13 NOTE — H&P (Signed)
[facility] History and Physical  Nello Corro RCV:[id number] DOB: [birth year] DOA: 11/13/2012  Referring physician:ER physician. PCP: Scarlette Calico, MD  Chief Complaint: Chest pain.  HPI: Willie Foster is a 47 y.o. male with history of hypertension has been experiencing chest pain for last 2 days. Chest pain has been mostly on the right side pleuritic in nature with mild shortness of breath. Due to persistent symptoms patient had gone to the urgent care and was referred to the ER concerning for PE. CT angiogram of the chest confirms patient has pulmonary embolism and patient has been admitted for further management. Patient denies having had any recent surgery or travel. Has not had previous DVT or PE. Patient's father had pulmonary embolism when patient's father was diagnosed with lung cancer. Patient presently is hemodynamically stable. Patient has been started heparin infusion. Cardiac markers have been negative EKG unremarkable.  Review of Systems: As presented in the history of presenting illness, rest negative.  Past Medical History  Diagnosis Date  . Hyperlipidemia   . Hypertension   . Fatty liver disease, nonalcoholic 1025   History reviewed. No pertinent past surgical history. Social History:  reports that he has never smoked. He has never used smokeless tobacco. He reports that he drinks about 3.0 ounces of alcohol per week. He reports that he does not use illicit drugs. Home. where does patient live-- Can do ADLs. Can patient participate in ADLs?  Allergies  Allergen Reactions  . Lipitor (Atorvastatin)     Elevated LFT's    Family History  Problem Relation Age of Onset  . Cancer Paternal Grandfather     Breast and Lung Cancer  . Hypertension Father   . Alcohol abuse Neg Hx   . COPD Neg Hx   . Diabetes Neg Hx   . Drug abuse Neg Hx   . Early death Neg Hx   . Heart disease Neg Hx   . Hyperlipidemia Neg Hx   . Kidney disease Neg Hx   . Stroke Neg Hx        Prior to Admission medications   Medication Sig Start Date End Date Taking? Authorizing Provider  acyclovir (ZOVIRAX) 400 MG tablet Take 1 tablet by mouth 3 (three) times daily as needed (fever blisters).  07/16/12  Yes Historical Provider, MD  atenolol-chlorthalidone (TENORETIC) 50-25 MG per tablet Take 1 tablet by mouth daily. 07/19/12  Yes Historical Provider, MD  Cetirizine HCl (ZYRTEC ALLERGY) 10 MG CAPS Take 10 mg by mouth daily.   Yes Historical Provider, MD  naproxen sodium (ANAPROX) 220 MG tablet Take 440 mg by mouth every 6 (six) hours as needed (pain).   Yes Historical Provider, MD  potassium chloride SA (K-DUR,KLOR-CON) 20 MEQ tablet Take 1 tablet (20 mEq total) by mouth 2 (two) times daily. 09/16/12  Yes [name], MD   Physical Exam: Filed Vitals:   11/13/12 1508 11/13/12 1730 11/13/12 1820 11/13/12 1954  BP:  118/73 118/73 135/62  Pulse: 75 68 68 79  Temp:    98.1 F (36.7 C)  TempSrc:    Oral  Resp: 19 20  16   Height:      Weight:      SpO2: 97% 99% 97% 98%     General:  Well-developed well-nourished.  Eyes:anicteric no pallor.  ENT: no discharge from ears eyes nose mouth.  Neck: no mass felt.  Cardiovascular: S1-S2 heard.  Respiratory: no rhonchi no crepitations.  Abdomen: soft nontender bowel sounds present.  Skin: no rash.  Musculoskeletal: no edema.  Psychiatric: appears normal.  Neurologic: alert awake oriented to time place and person. Moves all extremities.  Labs on Admission:  Basic Metabolic Panel:  Recent Labs Lab 11/13/12 1414  NA 142  K 3.4*  CL 98  CO2 35*  GLUCOSE 84  BUN 13  CREATININE 1.22  CALCIUM 10.5   Liver Function Tests: No results found for this basename: AST, ALT, ALKPHOS, BILITOT, PROT, ALBUMIN,  in the last 168 hours No results found for this basename: LIPASE, AMYLASE,  in the last 168 hours No results found for this basename: AMMONIA,  in the last 168 hours CBC:  Recent Labs Lab 11/13/12 1414  WBC  7.1  HGB 14.9  HCT 42.3  MCV 75.9*  PLT 160   Cardiac Enzymes: No results found for this basename: CKTOTAL, CKMB, CKMBINDEX, TROPONINI,  in the last 168 hours  BNP (last 3 results)  Recent Labs  11/13/12 1414  PROBNP 32.2   CBG: No results found for this basename: GLUCAP,  in the last 168 hours  Radiological Exams on Admission: Dg Chest 2 View  11/13/2012   *RADIOLOGY REPORT*  Clinical Data: Chest pain, cough, congestion and shortness of breath.  CHEST - 2 VIEW  Comparison: 11/13/2012  Findings: There is atelectasis at both lung bases, right greater than left.  No focal infiltrate, pulmonary edema or pleural fluid is identified.  Cardiac and mediastinal contours within normal limits.  Bony thorax is unremarkable.  IMPRESSION: Bibasilar atelectasis.  No acute findings.   Original Report Authenticated By: Aletta Edouard, M.D.   Dg Chest 2 View  11/13/2012   *RADIOLOGY REPORT*  Clinical Data: Right-sided chest pain  CHEST - 2 VIEW  Comparison: None  Findings: Normal heart size.  Decreased lung volumes.  No pleural effusion identified.  There is atelectasis involving the both lung bases right greater than left.  Right middle lobe infiltrate is also suspected based upon opacity seen on the lateral radiograph.  IMPRESSION:  1.  Lungs are suboptimally inflated. 2.  Bibasilar atelectasis. 3.  Based on the lateral radiograph I suspect there may be a right middle lobe infiltrate.   Original Report Authenticated By: Kerby Moors, M.D.   Ct Angio Chest W/cm &/or Wo Cm  11/13/2012   *RADIOLOGY REPORT*  Clinical Data: Pleuritic chest pain.  Rule out pulmonary embolism.  CT ANGIOGRAPHY CHEST  Technique:  Multidetector CT imaging of the chest using the standard protocol during bolus administration of intravenous contrast. Multiplanar reconstructed images including MIPs were obtained and reviewed to evaluate the vascular anatomy.  Contrast: 89m OMNIPAQUE IOHEXOL 350 MG/ML SOLN  Comparison: Chest x-ray  11/13/2012  Findings: Positive for pulmonary embolism right middle lobe and right lower lobe.  There is infiltrate in the right middle lobe which may be pulmonary infarction.  No emboli in the left lung.  There is bibasilar atelectasis right greater than left.  Small right pleural effusion.  Negative for aortic dissection or aneurysm.  Heart size is upper normal.  No significant coronary artery calcification is identified.  No mass or adenopathy.  No acute bony abnormality.  IMPRESSION: Pulmonary embolism right middle lobe and right lower lobe pulmonary arteries.  Probable infarct in the right middle lobe.  Bibasilar atelectasis and small right pleural effusion.   Original Report Authenticated By: DCarl Best M.D.    EKG: Independently reviewed. Normal sinus rhythm.  Assessment/Plan Principal Problem:   Pulmonary embolism Active Problems:   Essential hypertension, benign   1. Pulmonary embolism -  presently patient is hemodynamically stable. Pulmonary embolus was unprovoked. Check Dopplers of the lower extremity. Continue heparin infusion and if patient is stable hemodynamically may change to oral anticoagulants. Patient will eventually need referral to hematologist. 2. Hypertension - continue atenolol we'll hold off HCTZ as patient is presently receiving gentle hydration.    Code Status: full code.  Family Communication: none.  Disposition Plan: admit to inpatient.    Lior Hoen N. [facility] Pager [phone].  If 7PM-7AM, please contact night-coverage [email or web address] Password [facility] 11/13/2012, 8:39 PM

## 2012-11-14 ENCOUNTER — Encounter (HOSPITAL_COMMUNITY): Payer: Self-pay | Admitting: *Deleted

## 2012-11-14 DIAGNOSIS — E876 Hypokalemia: Secondary | ICD-10-CM | POA: Insufficient documentation

## 2012-11-14 DIAGNOSIS — I2699 Other pulmonary embolism without acute cor pulmonale: Secondary | ICD-10-CM

## 2012-11-14 DIAGNOSIS — T503X1A Poisoning by electrolytic, caloric and water-balance agents, accidental (unintentional), initial encounter: Secondary | ICD-10-CM

## 2012-11-14 DIAGNOSIS — R079 Chest pain, unspecified: Secondary | ICD-10-CM

## 2012-11-14 DIAGNOSIS — T502X1A Poisoning by carbonic-anhydrase inhibitors, benzothiadiazides and other diuretics, accidental (unintentional), initial encounter: Secondary | ICD-10-CM

## 2012-11-14 LAB — CBC WITH DIFFERENTIAL/PLATELET
Basophils Absolute: 0 10*3/uL (ref 0.0–0.1)
HCT: 38.2 % — ABNORMAL LOW (ref 39.0–52.0)
Lymphocytes Relative: 14 % (ref 12–46)
Monocytes Absolute: 1 10*3/uL (ref 0.1–1.0)
Neutro Abs: 6.9 10*3/uL (ref 1.7–7.7)
Neutrophils Relative %: 75 % (ref 43–77)
RDW: 14.1 % (ref 11.5–15.5)
WBC: 9.2 10*3/uL (ref 4.0–10.5)

## 2012-11-14 LAB — COMPREHENSIVE METABOLIC PANEL
Alkaline Phosphatase: 70 U/L (ref 39–117)
BUN: 12 mg/dL (ref 6–23)
Calcium: 9.3 mg/dL (ref 8.4–10.5)
GFR calc Af Amer: >90 mL/min (ref >90)
GFR calc non Af Amer: >90 mL/min (ref >90)
Glucose, Bld: 151 mg/dL — ABNORMAL HIGH (ref 70–99)
Potassium: 3.3 mEq/L — ABNORMAL LOW (ref 3.5–5.1)
Total Protein: 7.1 g/dL (ref 6.0–8.3)

## 2012-11-14 LAB — HEPARIN LEVEL (UNFRACTIONATED): Heparin Unfractionated: 0.76 IU/mL — ABNORMAL HIGH (ref 0.30–0.70)

## 2012-11-14 LAB — MAGNESIUM: Magnesium: 2.1 mg/dL (ref 1.5–2.5)

## 2012-11-14 MED ORDER — RIVAROXABAN 15 MG PO TABS
15.0000 mg | ORAL_TABLET | Freq: Two times a day (BID) | ORAL | Status: DC
  Administered 2012-11-14 – 2012-11-15 (×2): 15 mg via ORAL
  Filled 2012-11-14 (×4): qty 1

## 2012-11-14 MED ORDER — RIVAROXABAN 20 MG PO TABS: 20.0000 mg | ORAL_TABLET | Freq: Every day | ORAL | Status: DC

## 2012-11-14 MED ORDER — POTASSIUM CHLORIDE CRYS ER 20 MEQ PO TBCR
20.0000 meq | EXTENDED_RELEASE_TABLET | Freq: Once | ORAL | Status: AC
  Administered 2012-11-14: 20 meq via ORAL
  Filled 2012-11-14: qty 1

## 2012-11-14 MED ORDER — MORPHINE SULFATE 2 MG/ML IJ SOLN
2.0000 mg | INTRAMUSCULAR | Status: DC | PRN
  Administered 2012-11-14 – 2012-11-15 (×7): 2 mg via INTRAVENOUS
  Filled 2012-11-14 (×7): qty 1

## 2012-11-14 MED ORDER — PNEUMOCOCCAL VAC POLYVALENT 25 MCG/0.5ML IJ INJ
0.5000 mL | INJECTION | INTRAMUSCULAR | Status: AC
  Administered 2012-11-15: 0.5 mL via INTRAMUSCULAR
  Filled 2012-11-14: qty 0.5

## 2012-11-14 MED ORDER — POTASSIUM CHLORIDE CRYS ER 20 MEQ PO TBCR
40.0000 meq | EXTENDED_RELEASE_TABLET | Freq: Once | ORAL | Status: AC
  Administered 2012-11-14: 40 meq via ORAL

## 2012-11-14 NOTE — Progress Notes (Signed)
  Echocardiogram 2D Echocardiogram has been performed.  Diamond Nickel 11/14/2012, 2:33 PM

## 2012-11-14 NOTE — Progress Notes (Signed)
VASCULAR LAB PRELIMINARY  PRELIMINARY  PRELIMINARY  PRELIMINARY  Bilateral lower extremity venous duplex  completed.    Preliminary report:  Bilateral:  No evidence of DVT, superficial thrombosis, or Baker's Cyst.    Mika Anastasi, RVT 11/14/2012, 2:45 PM

## 2012-11-14 NOTE — Progress Notes (Signed)
[facility] PROGRESS NOTE  Willie Foster QTM:[id number] DOB: [birth year] DOA: 11/13/2012 PCP: Scarlette Calico, MD  Assessment/Plan:  #1 right middle and right lower lobe PE Unprovoked PE. Questionable etiology. Some clinical improvement. Lower extremity Doppler is pending. Will check a 2-D echo to rule out right ventricular strain. Continue IV heparin. Will consider starting patient on xarelto versus Coumadin.  #2 hypokalemia Secondary to diuretics. Repleted.  #3 hypertension Stable. Continue atenolol.  #4 chest pain Secondary to problem #1.  #5 hyperlipidemia Total cholesterol from 09/15/2012 was 163, LDL of 79. Followup as outpatient.  #6 prophylaxis On heparin for DVT prophylaxis.        Code Status: Full Family Communication: Updated patient no family at bedside. Disposition Plan:  home when medically stable and he  Consultants:  None  Procedures:  CT angiography chest 11/13/2012  Chest x-ray 11/13/2012  Antibiotics:  None  HPI/Subjective: Patient states pleuritic CP improving.  Objective: Filed Vitals:   11/13/12 1930 11/13/12 1954 11/13/12 2158 11/14/12 0500  BP: 135/62 135/62 135/79 114/68  Pulse: 77 79 70 80  Temp:  98.1 F (36.7 C) 98.4 F (36.9 C) 98.4 F (36.9 C)  TempSrc:  Oral    Resp: 31 16 18 16   Height:   5' 9"  (1.753 m)   Weight:   100.245 kg (221 lb)   SpO2: 96% 98% 95% 97%    Intake/Output Summary (Last 24 hours) at 11/14/12 0840 Last data filed at 11/14/12 0700  Gross per 24 hour  Intake 157.58 ml  Output      0 ml  Net 157.58 ml   Filed Weights   11/13/12 1353 11/13/12 2158  Weight: 97.523 kg (215 lb) 100.245 kg (221 lb)    Exam:   General:  NAD  Cardiovascular: RRR  Respiratory: CTAB  Abdomen: SOCT/NT/ND/+BS  Extremities: No c/c/e  Data Reviewed: Basic Metabolic Panel:  Recent Labs Lab 11/13/12 1414 11/14/12 0231  NA 142 137  K 3.4* 3.3*  CL 98 100  CO2 35* 30  GLUCOSE 84 151*  BUN 13 12   CREATININE 1.22 0.98  CALCIUM 10.5 9.3   Liver Function Tests:  Recent Labs Lab 11/14/12 0231  AST 21  ALT 31  ALKPHOS 70  BILITOT 0.4  PROT 7.1  ALBUMIN 3.6   No results found for this basename: LIPASE, AMYLASE,  in the last 168 hours No results found for this basename: AMMONIA,  in the last 168 hours CBC:  Recent Labs Lab 11/13/12 1414 11/14/12 0231  WBC 7.1 9.2  NEUTROABS  --  6.9  HGB 14.9 13.4  HCT 42.3 38.2*  MCV 75.9* 75.5*  PLT 160 144*   Cardiac Enzymes:  Recent Labs Lab 11/13/12 2219  TROPONINI <0.30   BNP (last 3 results)  Recent Labs  11/13/12 1414  PROBNP 32.2   CBG: No results found for this basename: GLUCAP,  in the last 168 hours  No results found for this or any previous visit (from the past 240 hour(s)).   Studies: Dg Chest 2 View  11/13/2012   *RADIOLOGY REPORT*  Clinical Data: Chest pain, cough, congestion and shortness of breath.  CHEST - 2 VIEW  Comparison: 11/13/2012  Findings: There is atelectasis at both lung bases, right greater than left.  No focal infiltrate, pulmonary edema or pleural fluid is identified.  Cardiac and mediastinal contours within normal limits.  Bony thorax is unremarkable.  IMPRESSION: Bibasilar atelectasis.  No acute findings.   Original Report Authenticated By: Aletta Edouard,  M.D.   Dg Chest 2 View  11/13/2012   *RADIOLOGY REPORT*  Clinical Data: Right-sided chest pain  CHEST - 2 VIEW  Comparison: None  Findings: Normal heart size.  Decreased lung volumes.  No pleural effusion identified.  There is atelectasis involving the both lung bases right greater than left.  Right middle lobe infiltrate is also suspected based upon opacity seen on the lateral radiograph.  IMPRESSION:  1.  Lungs are suboptimally inflated. 2.  Bibasilar atelectasis. 3.  Based on the lateral radiograph I suspect there may be a right middle lobe infiltrate.   Original Report Authenticated By: Kerby Moors, M.D.   Ct Angio Chest W/cm &/or Wo  Cm  11/13/2012   *RADIOLOGY REPORT*  Clinical Data: Pleuritic chest pain.  Rule out pulmonary embolism.  CT ANGIOGRAPHY CHEST  Technique:  Multidetector CT imaging of the chest using the standard protocol during bolus administration of intravenous contrast. Multiplanar reconstructed images including MIPs were obtained and reviewed to evaluate the vascular anatomy.  Contrast: 32m OMNIPAQUE IOHEXOL 350 MG/ML SOLN  Comparison: Chest x-ray 11/13/2012  Findings: Positive for pulmonary embolism right middle lobe and right lower lobe.  There is infiltrate in the right middle lobe which may be pulmonary infarction.  No emboli in the left lung.  There is bibasilar atelectasis right greater than left.  Small right pleural effusion.  Negative for aortic dissection or aneurysm.  Heart size is upper normal.  No significant coronary artery calcification is identified.  No mass or adenopathy.  No acute bony abnormality.  IMPRESSION: Pulmonary embolism right middle lobe and right lower lobe pulmonary arteries.  Probable infarct in the right middle lobe.  Bibasilar atelectasis and small right pleural effusion.   Original Report Authenticated By: DCarl Best M.D.    Scheduled Meds: . atenolol  50 mg Oral Daily  . loratadine  10 mg Oral Daily  . potassium chloride  40 mEq Oral Once  . sodium chloride  3 mL Intravenous Q12H   Continuous Infusions: . sodium chloride 75 mL/hr at 11/13/12 2213  . heparin 1,500 Units/hr (11/14/12 0315)    Principal Problem:   Pulmonary embolism Active Problems:   Essential hypertension, benign   Pure hypercholesterolemia   Chest pain   BPH (benign prostatic hyperplasia)   Diuretic-induced hypokalemia    Time spent: > 35 mins    [facility] Pager [phone] If 7PM-7AM, please contact night-coverage at [email or web address], password [email or web address]6/23/2014, 8:40 AM  LOS: 1 day

## 2012-11-14 NOTE — Progress Notes (Signed)
PT Cancellation Note  Patient Details Name: Willie Foster MRN: [id number] DOB: 06-Oct-1965   Cancelled Treatment:    Reason Eval/Treat Not Completed: Patient at procedure or test/unavailable. Pt at ECHO. PT to return as able.   Kittie Plater, PT, DPT Pager #: [phone] Office #: [phone]

## 2012-11-14 NOTE — Progress Notes (Signed)
CDC VIS for PPSV given and discussed with patient and family. Education provided re:  risk factors for pneumococcal pneumonia. Patient desires immunization at this time; order entered for PPSV to be given in AM.

## 2012-11-14 NOTE — Progress Notes (Signed)
ANTICOAGULATION CONSULT NOTE - Follow Up Consult  Pharmacy Consult for heparin Indication: pulmonary embolus  Labs:  Recent Labs  11/13/12 1414 11/13/12 1941 11/13/12 2219 11/14/12 0231  HGB 14.9  --   --  13.4  HCT 42.3  --   --  38.2*  PLT 160  --   --  144*  LABPROT  --  13.1  --   --   INR  --  1.00  --   --   HEPARINUNFRC  --   --   --  0.84*  CREATININE 1.22  --   --   --   TROPONINI  --   --  <0.30  --     Assessment: 47yo male supratherapeutic on heparin with initial dosing for PE.  Goal of Therapy:  Heparin level 0.3-0.7 units/ml   Plan:  Will decrease heparin gtt by 1 unit/kg/hr to 1500 units/hr and check level in 6hr.  Wynona Neat, PharmD, BCPS  11/14/2012,3:05 AM

## 2012-11-14 NOTE — Care Management (Signed)
MD- Xarelto Discount card given to pt and the co pay cost will be 10.00. Medication is available. Thanks Bethena Roys, RN,BSN [phone].

## 2012-11-14 NOTE — Progress Notes (Signed)
UR Completed Sharonda Llamas Graves-Bigelow, RN,BSN [phone]  

## 2012-11-14 NOTE — Progress Notes (Addendum)
ANTICOAGULATION CONSULT NOTE - Follow Up Consult  Pharmacy Consult for Heparin >> Xarelto (see addendum) Indication: pulmonary embolus  Allergies  Allergen Reactions  . Lipitor (Atorvastatin)     Elevated LFT's    Patient Measurements: Height: 5' 9"  (175.3 cm) Weight: 221 lb (100.245 kg) IBW/kg (Calculated) : 70.7 Heparin Dosing Weight: 91.9 kg  Vital Signs: Temp: 98.4 F (36.9 C) (06/23 0500) BP: 114/68 mmHg (06/23 0500) Pulse Rate: 80 (06/23 0500)  Labs:  Recent Labs  11/13/12 1414 11/13/12 1941 11/13/12 2219 11/14/12 0231 11/14/12 0845  HGB 14.9  --   --  13.4  --   HCT 42.3  --   --  38.2*  --   PLT 160  --   --  144*  --   LABPROT  --  13.1  --   --   --   INR  --  1.00  --   --   --   HEPARINUNFRC  --   --   --  0.84* 0.76*  CREATININE 1.22  --   --  0.98  --   TROPONINI  --   --  <0.30  --   --     Estimated Creatinine Clearance: 107.6 ml/min (by C-G formula based on Cr of 0.98).   Medications:  Heparin @ 1500 units/hr (15 ml/hr)  Assessment: 47 y.o. M who continues on heparin for anticoagulation in the setting of a new acute PE. Heparin level this morning remains slightly SUPRAtherapeutic despite a slight rate decrease this monring (0.76 << 0.84, goal of 0.3-0.7). Hgb/Hct/Plt slight drop, no s/sx of bleeding noted.  Goal of Therapy:  Heparin level 0.3-0.7 units/ml Monitor platelets by anticoagulation protocol: Yes   Plan:  1. Reduce heparin drip slight to 1400 units/hr (14 ml/hr) 2. Will continue to monitor for any signs/symptoms of bleeding and will follow up with heparin level in 6 hours   Alycia Rossetti, PharmD, BCPS Clinical Pharmacist Pager: [phone] 11/14/2012 9:18 AM    -------------------------------------------------------------------------------------------------  Assessment: Received orders this afternoon to transition this patient from Heparin to Xarelto today for continued treatment of the patient's PE. In order to appropriately  transition between these agents, heparin should be stopped at the time Xarelto is administered (given with meals). Will plan to make this transition with supper this evening.  The patient and his family members were educated on Xarelto today.  Plan 1. Start Xarelto 15 mg bid with meals (first dose this evening) -- to continue for 3 weeks (6/23 >> 7/14) 2. After 3 weeks have been completed, transition to Xarelto 20 mg daily (starting on 7/15) 3. Will continue to monitor for any signs/symptoms of bleeding and continue to monitor renal function for any necessary dose adjustments.   Alycia Rossetti, PharmD, BCPS Clinical Pharmacist Pager: [phone] 11/14/2012 3:31 PM

## 2012-11-14 NOTE — Care Management Note (Signed)
    Page 1 of 1   11/14/2012     11:47:37 AM   CARE MANAGEMENT NOTE 11/14/2012  Patient:  Willie Foster,Willie Foster   Account Number:  [id number]  Date Initiated:  11/14/2012  Documentation initiated by:  GRAVES-BIGELOW,Serenna Deroy  Subjective/Objective Assessment:   Pt admitted for PE. Initiated on IV heparin. CM did a benefits check for xarelto and the cost will be 100.00.     Action/Plan:   CM did provide pt with discount card for xarelto. Pt to call to see if qualifies for disocunt of 10.00. No further needs from CM at this time.   Anticipated DC Date:  11/16/2012   Anticipated DC Plan:  [place]  CM consult      Choice offered to / List presented to:             Status of service:  Completed, signed off Medicare Important Message given?   (If response is "NO", the following Medicare IM given date fields will be blank) Date Medicare IM given:   Date Additional Medicare IM given:    Discharge Disposition:  HOME/SELF CARE  Per UR Regulation:  Reviewed for med. necessity/level of care/duration of stay  If discussed at [place] of Stay Meetings, dates discussed:    Comments:

## 2012-11-15 ENCOUNTER — Telehealth: Payer: Self-pay | Admitting: *Deleted

## 2012-11-15 LAB — CBC
HCT: 35.5 % — ABNORMAL LOW (ref 39.0–52.0)
Hemoglobin: 12.2 g/dL — ABNORMAL LOW (ref 13.0–17.0)
MCH: 26.1 pg (ref 26.0–34.0)
MCHC: 34.4 g/dL (ref 30.0–36.0)
MCV: 76 fL — ABNORMAL LOW (ref 78.0–100.0)

## 2012-11-15 LAB — BASIC METABOLIC PANEL
BUN: 9 mg/dL (ref 6–23)
Calcium: 9 mg/dL (ref 8.4–10.5)
GFR calc non Af Amer: 76 mL/min — ABNORMAL LOW (ref >90)
Glucose, Bld: 94 mg/dL (ref 70–99)
Potassium: 3.4 mEq/L — ABNORMAL LOW (ref 3.5–5.1)

## 2012-11-15 MED ORDER — RIVAROXABAN 20 MG PO TABS: 20.0000 mg | ORAL_TABLET | Freq: Every day | ORAL | Status: DC

## 2012-11-15 MED ORDER — RIVAROXABAN 15 MG PO TABS: 15.0000 mg | ORAL_TABLET | Freq: Two times a day (BID) | ORAL | Status: DC

## 2012-11-15 MED ORDER — OXYCODONE HCL 5 MG PO TABS: 5.0000 mg | ORAL_TABLET | ORAL | Status: DC | PRN

## 2012-11-15 MED ORDER — POTASSIUM CHLORIDE CRYS ER 20 MEQ PO TBCR
40.0000 meq | EXTENDED_RELEASE_TABLET | Freq: Once | ORAL | Status: AC
  Administered 2012-11-15: 40 meq via ORAL
  Filled 2012-11-15: qty 2

## 2012-11-15 NOTE — Discharge Summary (Signed)
Physician Discharge Summary  Willie Foster OJJ:[id number] DOB: [birth year] DOA: 11/13/2012  PCP: Scarlette Calico, MD  Admit date: 11/13/2012 Discharge date: 11/15/2012  Time spent: 60 minutes  Recommendations for Outpatient Follow-up:  1. Patient is to followup with his PCP one week post discharge. On followup CBC will need to be checked to followup on patient's hemoglobin. A basic metabolic profile today to be checked to followup on patient's electrolytes and renal function. May consider checking a hypercoagulable panel once patient's anticoagulation has been discontinued posttreatment.  Discharge Diagnoses:  Principal Problem:   Pulmonary embolism Active Problems:   Essential hypertension, benign   Pure hypercholesterolemia   Chest pain   BPH (benign prostatic hyperplasia)   Diuretic-induced hypokalemia   Discharge Condition: Stable and improved  Diet recommendation: Heart healthy  Filed Weights   11/13/12 1353 11/13/12 2158 11/15/12 0500  Weight: 97.523 kg (215 lb) 100.245 kg (221 lb) 101.016 kg (222 lb 11.2 oz)    History of present illness:  Willie Foster is a 47 y.o. male with history of hypertension has been experiencing chest pain for last 2 days. Chest pain has been mostly on the right side pleuritic in nature with mild shortness of breath. Due to persistent symptoms patient had gone to the urgent care and was referred to the ER concerning for PE. CT angiogram of the chest confirms patient has pulmonary embolism and patient has been admitted for further management. Patient denies having had any recent surgery or travel. Has not had previous DVT or PE. Patient's father had pulmonary embolism when patient's father was diagnosed with lung cancer. Patient presently is hemodynamically stable. Patient has been started heparin infusion. Cardiac markers have been negative EKG unremarkable.   Hospital Course:  #1 right middle and right lower lobe PE  Patient was admitted with a  unprovoked PE noted on CT angiogram. Patient denied any recent travel. No recent surgeries. Patient was placed on telemetry and monitored. Patient was started on IV heparin and followed. Lower extremity Dopplers were done which were negative for DVT. 2-D echo was done which was negative for right ventricular strain. Patient was subsequently transitioned to xarelto which he tolerated and patient be discharged home on xeralto. As this is patient's first PE he would likely require 3-6 months of anticoagulation therapy with xarelto. Patient will need to followup with PCP post discharge in 1 week. Once patient has completed his course of xeralto/anticoagulation may consider checking a hypercoagulable panel. Will defer to patient's PCP.  #2 hypokalemia  Secondary to diuretics. Repleted.  #3 hypertension  Stable. Continue atenolol.  #4 chest pain  Secondary to problem #1.  #5 hyperlipidemia  Total cholesterol from 09/15/2012 was 163, LDL of 79. Followup as outpatient.   Procedures: CT angiography chest 11/13/2012  Chest x-ray 11/13/2012 Lower extremity Dopplers 11/14/2012 2-D echo 11/14/2012  Consultations:  None  Discharge Exam: Filed Vitals:   11/14/12 2100 11/15/12 0500 11/15/12 1014 11/15/12 1016  BP: 129/83 116/72 101/65 127/83  Pulse: 73 67 71   Temp: 98.8 F (37.1 C) 98.3 F (36.8 C)    TempSrc: Oral Oral    Resp: 18 18    Height:      Weight:  101.016 kg (222 lb 11.2 oz)    SpO2: 96% 97%      General: NAD Cardiovascular: RRR Respiratory: CTAB  Discharge Instructions  Discharge Orders   Future Orders Complete By Expires     Diet - low sodium heart healthy  As directed  Discharge instructions  As directed     Comments:      Follow up with Scarlette Calico, MD in 1 week.    Increase activity slowly  As directed         Medication List    TAKE these medications       acyclovir 400 MG tablet  Commonly known as:  ZOVIRAX  Take 1 tablet by mouth 3 (three) times  daily as needed (fever blisters).     atenolol-chlorthalidone 50-25 MG per tablet  Commonly known as:  TENORETIC  Take 1 tablet by mouth daily.     naproxen sodium 220 MG tablet  Commonly known as:  ANAPROX  Take 440 mg by mouth every 6 (six) hours as needed (pain).     oxyCODONE 5 MG immediate release tablet  Commonly known as:  Oxy IR/ROXICODONE  Take 1 tablet (5 mg total) by mouth every 4 (four) hours as needed.     potassium chloride SA 20 MEQ tablet  Commonly known as:  K-DUR,KLOR-CON  Take 1 tablet (20 mEq total) by mouth 2 (two) times daily.     Rivaroxaban 15 MG Tabs tablet  Commonly known as:  XARELTO  Take 1 tablet (15 mg total) by mouth 2 (two) times daily with a meal. Take 1 tablet 2 times daily x 3 weeks.     Rivaroxaban 20 MG Tabs  Commonly known as:  XARELTO  Take 1 tablet (20 mg total) by mouth daily with supper.  Start taking on:  12/06/2012     ZYRTEC ALLERGY 10 MG Caps  Generic drug:  Cetirizine HCl  Take 10 mg by mouth daily.       Allergies  Allergen Reactions  . Lipitor (Atorvastatin)     Elevated LFT's       Follow-up Information   Follow up with Scarlette Calico, MD. Schedule an appointment as soon as possible for a visit in 1 week.   Contact information:   [address] [place] [address] [phone]        The results of significant diagnostics from this hospitalization (including imaging, microbiology, ancillary and laboratory) are listed below for reference.    Significant Diagnostic Studies: Dg Chest 2 View  11/13/2012   *RADIOLOGY REPORT*  Clinical Data: Chest pain, cough, congestion and shortness of breath.  CHEST - 2 VIEW  Comparison: 11/13/2012  Findings: There is atelectasis at both lung bases, right greater than left.  No focal infiltrate, pulmonary edema or pleural fluid is identified.  Cardiac and mediastinal contours within normal limits.  Bony thorax is unremarkable.  IMPRESSION: Bibasilar  atelectasis.  No acute findings.   Original Report Authenticated By: Aletta Edouard, M.D.   Dg Chest 2 View  11/13/2012   *RADIOLOGY REPORT*  Clinical Data: Right-sided chest pain  CHEST - 2 VIEW  Comparison: None  Findings: Normal heart size.  Decreased lung volumes.  No pleural effusion identified.  There is atelectasis involving the both lung bases right greater than left.  Right middle lobe infiltrate is also suspected based upon opacity seen on the lateral radiograph.  IMPRESSION:  1.  Lungs are suboptimally inflated. 2.  Bibasilar atelectasis. 3.  Based on the lateral radiograph I suspect there may be a right middle lobe infiltrate.   Original Report Authenticated By: Kerby Moors, M.D.   Ct Angio Chest W/cm &/or Wo Cm  11/13/2012   *RADIOLOGY REPORT*  Clinical Data: Pleuritic chest pain.  Rule out pulmonary embolism.  CT ANGIOGRAPHY CHEST  Technique:  Multidetector CT imaging of the chest using the standard protocol during bolus administration of intravenous contrast. Multiplanar reconstructed images including MIPs were obtained and reviewed to evaluate the vascular anatomy.  Contrast: 31m OMNIPAQUE IOHEXOL 350 MG/ML SOLN  Comparison: Chest x-ray 11/13/2012  Findings: Positive for pulmonary embolism right middle lobe and right lower lobe.  There is infiltrate in the right middle lobe which may be pulmonary infarction.  No emboli in the left lung.  There is bibasilar atelectasis right greater than left.  Small right pleural effusion.  Negative for aortic dissection or aneurysm.  Heart size is upper normal.  No significant coronary artery calcification is identified.  No mass or adenopathy.  No acute bony abnormality.  IMPRESSION: Pulmonary embolism right middle lobe and right lower lobe pulmonary arteries.  Probable infarct in the right middle lobe.  Bibasilar atelectasis and small right pleural effusion.   Original Report Authenticated By: DCarl Best M.D.    Microbiology: No results found for  this or any previous visit (from the past 240 hour(s)).   Labs: Basic Metabolic Panel:  Recent Labs Lab 11/13/12 1414 11/14/12 0231 11/14/12 0845 11/15/12 0500  NA 142 137  --  137  K 3.4* 3.3*  --  3.4*  CL 98 100  --  100  CO2 35* 30  --  28  GLUCOSE 84 151*  --  94  BUN 13 12  --  9  CREATININE 1.22 0.98  --  1.12  CALCIUM 10.5 9.3  --  9.0  MG  --   --  2.1  --    Liver Function Tests:  Recent Labs Lab 11/14/12 0231  AST 21  ALT 31  ALKPHOS 70  BILITOT 0.4  PROT 7.1  ALBUMIN 3.6   No results found for this basename: LIPASE, AMYLASE,  in the last 168 hours No results found for this basename: AMMONIA,  in the last 168 hours CBC:  Recent Labs Lab 11/13/12 1414 11/14/12 0231 11/15/12 0500  WBC 7.1 9.2 7.0  NEUTROABS  --  6.9  --   HGB 14.9 13.4 12.2*  HCT 42.3 38.2* 35.5*  MCV 75.9* 75.5* 76.0*  PLT 160 144* 148*   Cardiac Enzymes:  Recent Labs Lab 11/13/12 2219  TROPONINI <0.30   BNP: BNP (last 3 results)  Recent Labs  11/13/12 1414  PROBNP 32.2   CBG: No results found for this basename: GLUCAP,  in the last 168 hours     Signed:  Navayah Sok  [facility] 11/15/2012, 11:48 AM

## 2012-11-15 NOTE — Evaluation (Signed)
Occupational Therapy Evaluation Patient Details Name: Willie Foster MRN: [id number] DOB: 1965/11/11 Today's Date: 11/15/2012 Time: 0812-0825 OT Time Calculation (min): 13 min  OT Assessment / Plan / Recommendation Clinical Impression  Pt admitted to hospital with SOB and found to have PE. Pt's with mild SOB with activity but able to recover quickly and no c/o pain or discomfort. Pt performance with ADL is at basline. No safety concerns.    OT Assessment  Patient does not need any further OT services    Follow Up Recommendations  No OT follow up    Barriers to Discharge      Equipment Recommendations       Recommendations for Other Services    Frequency       Precautions / Restrictions Precautions Precautions: None   Pertinent Vitals/Pain No c/o pain    ADL  Eating/Feeding: Performed;Independent Grooming: Performed;Wash/dry hands;Modified independent Where Assessed - Grooming: Unsupported standing Lower Body Dressing: Performed;Modified independent Where Assessed - Lower Body Dressing: Unsupported sit to stand Toilet Transfer: Performed;Modified independent Toilet Transfer Method: Stand pivot Science writer: Regular height toilet Toileting - Clothing Manipulation and Hygiene: Performed;Modified independent Where Assessed - Toileting Clothing Manipulation and Hygiene: Sit on 3-in-1 or toilet Transfers/Ambulation Related to ADLs: Pt performed functional ambulation around the unit mod I without an AD with no c/o SOB. Pt's O2 remained above 90% on Ra with encouragement of pursed lip breathing. Appears to sounds like crackles in their lungs with normal breathing rate wiht standing in room.  Pt able to make quick turns with no LOB. No reports of dizziness ADL Comments: Pt performace is at baseline for ADL tasks. No further OT needed at this time.     OT Diagnosis:    OT Problem List:   OT Treatment Interventions:     OT Goals    Visit Information  Last OT  Received On: 11/15/12 Assistance Needed: +1    Subjective Data  Subjective: I feel better today  Patient Stated Goal: to get better and d/c home   Prior [place] Lives With: Spouse Available Help at Discharge: Family Type of Home: House Bathroom Shower/Tub: Chiropodist: Standard Prior Function Level of Independence: Independent         Vision/Perception Vision - History Baseline Vision: No visual deficits Patient Visual Report: No change from baseline Vision - Assessment Eye Alignment: Within Functional Limits Perception Perception: Within Functional Limits Praxis Praxis: Intact   Cognition  Cognition Arousal/Alertness: Awake/alert Behavior During Therapy: WFL for tasks assessed/performed Overall Cognitive Status: Within Functional Limits for tasks assessed    Extremity/Trunk Assessment Right Upper Extremity Assessment RUE ROM/Strength/Tone: Within functional levels RUE Sensation: WFL - Light Touch RUE Coordination: WFL - gross/fine motor Left Upper Extremity Assessment LUE ROM/Strength/Tone: Within functional levels LUE Sensation: WFL - Light Touch LUE Coordination: WFL - gross/fine motor Trunk Assessment Trunk Assessment: Normal     Mobility Bed Mobility Bed Mobility: Supine to Sit Supine to Sit: 6: Modified independent (Device/Increase time) Transfers Transfers: Sit to Stand;Stand to Sit Sit to Stand: 7: Independent Stand to Sit: 7: Independent Details for Transfer Assistance: no cuing needed for safety     Exercise     Balance Balance Balance Assessed: Yes Static Standing Balance Static Standing - Level of Assistance: 7: Independent Dynamic Standing Balance Dynamic Standing - Level of Assistance: 6: Modified independent (Device/Increase time)   End of Session OT - End of Session Activity Tolerance: Patient tolerated treatment well  Patient left: in chair;with call bell/phone within reach;with  family/visitor present Nurse Communication: Mobility status;Precautions  GO     Willie Foster [facility] 11/15/2012, 10:40 AM

## 2012-11-15 NOTE — ED Provider Notes (Signed)
I saw and evaluated the patient, reviewed the resident's note and I agree with the findings and plan. R sided pleuritic chest pain. Sent from [facility] to r/u PE. Elevated d-dimer and CT  Pos for r PE. Admitted to triad.   Julianne Rice, MD 11/15/12 [phone]

## 2012-11-15 NOTE — Progress Notes (Signed)
Discharge Note: Pt is alert and oriented, VS are stable, denies CP.  Telemetry & IV discontinued.  Discharge instructions reviewed with patient, pt verbalizes understanding.  Wheelchair transportation provided, all belongings with patient  

## 2012-11-16 NOTE — Telephone Encounter (Signed)
Transferred pt to scheduling for appointment.

## 2012-11-18 ENCOUNTER — Ambulatory Visit (INDEPENDENT_AMBULATORY_CARE_PROVIDER_SITE_OTHER)
Admission: RE | Admit: 2012-11-18 | Discharge: 2012-11-18 | Disposition: A | Discharge status: Home / Self Care | Payer: BC Managed Care – PPO | Source: Ambulatory Visit | Attending: Internal Medicine | Admitting: Internal Medicine

## 2012-11-18 ENCOUNTER — Other Ambulatory Visit (INDEPENDENT_AMBULATORY_CARE_PROVIDER_SITE_OTHER): Payer: BC Managed Care – PPO

## 2012-11-18 ENCOUNTER — Ambulatory Visit (INDEPENDENT_AMBULATORY_CARE_PROVIDER_SITE_OTHER): Payer: BC Managed Care – PPO | Admitting: Internal Medicine

## 2012-11-18 ENCOUNTER — Encounter: Payer: Self-pay | Admitting: Internal Medicine

## 2012-11-18 VITALS — BP 118/70 | HR 74 | Temp 97.1°F | Resp 16 | Wt 220.5 lb

## 2012-11-18 DIAGNOSIS — I2699 Other pulmonary embolism without acute cor pulmonale: Secondary | ICD-10-CM

## 2012-11-18 DIAGNOSIS — K921 Melena: Secondary | ICD-10-CM

## 2012-11-18 DIAGNOSIS — R042 Hemoptysis: Secondary | ICD-10-CM

## 2012-11-18 DIAGNOSIS — D649 Anemia, unspecified: Secondary | ICD-10-CM | POA: Insufficient documentation

## 2012-11-18 DIAGNOSIS — I10 Essential (primary) hypertension: Secondary | ICD-10-CM

## 2012-11-18 DIAGNOSIS — E7800 Pure hypercholesterolemia, unspecified: Secondary | ICD-10-CM

## 2012-11-18 LAB — COMPREHENSIVE METABOLIC PANEL
Albumin: 3.9 g/dL (ref 3.5–5.2)
BUN: 14 mg/dL (ref 6–23)
CO2: 28 mEq/L (ref 19–32)
Calcium: 10.1 mg/dL (ref 8.4–10.5)
Chloride: 97 mEq/L (ref 96–112)
Creatinine, Ser: 1 mg/dL (ref 0.4–1.5)
GFR: 85.44 mL/min (ref >60.00)
Glucose, Bld: 125 mg/dL — ABNORMAL HIGH (ref 70–99)
Potassium: 3.8 mEq/L (ref 3.5–5.1)

## 2012-11-18 LAB — IBC PANEL
Iron: 43 ug/dL (ref 42–165)
Transferrin: 229.5 mg/dL (ref 212.0–360.0)

## 2012-11-18 LAB — VITAMIN B12: Vitamin B-12: 384 pg/mL (ref 211–911)

## 2012-11-18 LAB — FERRITIN: Ferritin: 557.6 ng/mL — ABNORMAL HIGH (ref 22.0–322.0)

## 2012-11-18 LAB — CBC WITH DIFFERENTIAL/PLATELET
Basophils Absolute: 0 10*3/uL (ref 0.0–0.1)
Basophils Relative: 0.5 % (ref 0.0–3.0)
MCHC: 33.8 g/dL (ref 30.0–36.0)
MCV: 78.9 fl (ref 78.0–100.0)
Neutro Abs: 2.8 10*3/uL (ref 1.4–7.7)
Neutrophils Relative %: 58.1 % (ref 43.0–77.0)
RBC: 5.45 Mil/uL (ref 4.22–5.81)

## 2012-11-18 NOTE — Assessment & Plan Note (Signed)
His BP is well controlled 

## 2012-11-18 NOTE — Assessment & Plan Note (Signed)
Now that he has had a PE it is even more important that the blood in his stool be thoroughly evaluated so I have referred him to GI

## 2012-11-18 NOTE — Assessment & Plan Note (Signed)
He is anti-coagulted and appears to be recovering

## 2012-11-18 NOTE — Assessment & Plan Note (Signed)
I don't think the blood in his sputum is enough to explain his anemia I will recheck his CBC today, will look at his vitamin levels and will check a retic count

## 2012-11-18 NOTE — Progress Notes (Signed)
Subjective:    Patient ID: Willie Foster, male    DOB: 1965/10/17, 47 y.o.   MRN: [id number]  Cough This is a recurrent problem. The current episode started in the past 7 days. The problem has been rapidly improving. The cough is productive of bloody sputum. Associated symptoms include hemoptysis (occasional blood tinged sputum). Pertinent negatives include no chest pain, chills, ear pain, fever, headaches, heartburn, myalgias, nasal congestion, postnasal drip, rash, rhinorrhea, sore throat, shortness of breath, sweats, weight loss or wheezing. Nothing aggravates the symptoms.      Review of Systems  Constitutional: Negative for fever, chills, weight loss, diaphoresis, fatigue and unexpected weight change.  HENT: Negative.  Negative for ear pain, sore throat, rhinorrhea and postnasal drip.   Eyes: Negative.   Respiratory: Positive for cough and hemoptysis (occasional blood tinged sputum). Negative for apnea, choking, chest tightness, shortness of breath, wheezing and stridor.   Cardiovascular: Negative for chest pain, palpitations and leg swelling.  Gastrointestinal: Positive for blood in stool and anal bleeding. Negative for heartburn, nausea, vomiting, abdominal pain, diarrhea, constipation, abdominal distention and rectal pain.  Endocrine: Negative.   Genitourinary: Negative.  Negative for hematuria.  Musculoskeletal: Negative.  Negative for myalgias.  Skin: Negative.  Negative for color change, pallor, rash and wound.  Allergic/Immunologic: Negative.   Neurological: Negative for dizziness, syncope, speech difficulty, weakness, light-headedness and headaches.  Hematological: Negative.  Negative for adenopathy. Does not bruise/bleed easily.  Psychiatric/Behavioral: Negative.        Objective:   Physical Exam  Vitals reviewed. Constitutional: He is oriented to person, place, and time. He appears well-developed and well-nourished.  Non-toxic appearance. He does not have a sickly  appearance. He does not appear ill. No distress.  HENT:  Head: Normocephalic and atraumatic.  Mouth/Throat: Oropharynx is clear and moist. No oropharyngeal exudate.  Eyes: Conjunctivae are normal. Right eye exhibits no discharge. Left eye exhibits no discharge. No scleral icterus.  Neck: Normal range of motion. Neck supple. No JVD present. No thyromegaly present.  Cardiovascular: Normal rate, regular rhythm, normal heart sounds and intact distal pulses.  Exam reveals no gallop and no friction rub.   No murmur heard. Pulmonary/Chest: Effort normal and breath sounds normal. No stridor. No respiratory distress. He has no wheezes. He has no rales. He exhibits no tenderness.  Abdominal: Soft. Bowel sounds are normal. He exhibits no distension and no mass. There is no tenderness. There is no rebound and no guarding.  Musculoskeletal: Normal range of motion. He exhibits no edema and no tenderness.  Lymphadenopathy:    He has no cervical adenopathy.  Neurological: He is oriented to person, place, and time.  Skin: Skin is warm and dry. No rash noted. He is not diaphoretic. No erythema. No pallor.  Psychiatric: He has a normal mood and affect. His behavior is normal. Judgment and thought content normal.     Lab Results  Component Value Date   WBC 7.0 11/15/2012   HGB 12.2* 11/15/2012   HCT 35.5* 11/15/2012   PLT 148* 11/15/2012   GLUCOSE 94 11/15/2012   CHOL 163 09/15/2012   TRIG 188.0* 09/15/2012   HDL 46.10 09/15/2012   LDLCALC 79 09/15/2012   ALT 31 11/14/2012   AST 21 11/14/2012   NA 137 11/15/2012   K 3.4* 11/15/2012   CL 100 11/15/2012   CREATININE 1.12 11/15/2012   BUN 9 11/15/2012   CO2 28 11/15/2012   TSH 0.80 09/15/2012   PSA 0.68 09/15/2012   INR 1.00 11/13/2012  Assessment & Plan:

## 2012-11-18 NOTE — Assessment & Plan Note (Signed)
He has had mild, intermittent hemoptysis since the PE was diagnosed, he tells me that the blood in the cough is diminishing so I don't think this represents a worsening of the process His Chest Xray appearance is as expected as he recovers from the PE's

## 2012-11-18 NOTE — Patient Instructions (Signed)
Pulmonary Embolus A pulmonary (lung) embolus (PE) is a blood clot that has traveled from another place in the body to the lung. Most clots come from deep veins in the legs or pelvis. PE is a dangerous and potentially life-threatening condition that can be treated if identified. CAUSES Blood clots form in a vein for different reasons. Usually several things cause blood clots. They include:  The flow of blood slows down.  The inside of the vein is damaged in some way.  The person has a condition that makes the blood clot more easily. These conditions may include:  Older age (especially over 75 years old).  Having a history of blood clots.  Having major or lengthy surgery. Hip surgery is particularly high-risk.  Breaking a hip or leg.  Sitting or lying still for a long time.  Cancer or cancer treatment.  Having a long, thin tube (catheter) placed inside a vein during a medical procedure.  Being overweight (obese).  Pregnancy and childbirth.  Medicines with estrogen.  Smoking.  Other circulation or heart problems. SYMPTOMS  The symptoms of a PE usually start suddenly and include:  Shortness of breath.  Coughing.  Coughing up blood or blood-tinged mucus (phlegm).  Chest pain. Pain is often worse with deep breaths.  Rapid heartbeat. DIAGNOSIS  If a PE is suspected, your caregiver will take a medical history and carry out a physical exam. Your caregiver will check for the risk factors listed above. Tests that also may be required include:  Blood tests, including studies of the clotting properties of your blood.  Imaging tests. Ultrasound, CT, MRI, and other tests can all be used to see if you have clots in your legs or lungs. If you have a clot in your legs and have breathing or chest problems, your caregiver may conclude that you have a clot in your lungs. Further lung tests may not be needed.  Electrocardiography can look for heart strain from blood clots in the  lungs. PREVENTION   Exercise the legs regularly. Take a brisk 30 minute walk every day.  Maintain a weight that is appropriate for your height.  Avoid sitting or lying in bed for long periods of time without moving your legs.  Women, particularly those over the age of 35, should consider the risks and benefits of taking estrogen medicines, including birth control pills.  Do not smoke, especially if you take estrogen medicines.  Long-distance travel can increase your risk. You should exercise your legs by walking or pumping the muscles every hour.  In hospital prevention:  Your caregiver will assess your need for preventive PE care (prophylaxis) when you are admitted to the hospital. If you are having surgery, your surgeon will assess you the day of or day after surgery.  Prevention may include medical and nonmedical measures. TREATMENT   The most common treatment for a PE is blood thinning (anticoagulant) medicine, which reduces the blood's tendency to clot. Anticoagulants can stop new blood clots from forming and old ones from growing. They cannot dissolve existing clots. Your body does this by itself over time. Anticoagulants can be given by mouth, by intravenous (IV) access, or by injection. Your caregiver will determine the best program for you.  Less commonly, clot-dissolving drugs (thrombolytics) are used to dissolve a PE. They carry a high risk of bleeding, so they are used mainly in severe cases.  Very rarely, a blood clot in the leg needs to be removed surgically.  If you are unable to   take anticoagulants, your caregiver may arrange for you to have a filter placed in a main vein in your abdomen. This filter prevents clots from traveling to your lungs. HOME CARE INSTRUCTIONS   Take all medicines prescribed by your caregiver. Follow the directions carefully.  Warfarin. Most people will continue taking warfarin after hospital discharge. Your caregiver will advise you on the  length of treatment (usually 3 6 months, sometimes lifelong).  Too much and too little warfarin are both dangerous. Too much warfarin increases the risk of bleeding. Too little warfarin continues to allow the risk for blood clots. While taking warfarin, you will need to have regular blood tests to measure your blood clotting time. These blood tests usually include both the prothrombin time (PT) and International Normalized Ratio (INR) tests. The PT and INR results allow your caregiver to adjust your dose of warfarin. The dose can change for many reasons. It is critically important that you take warfarin exactly as prescribed, and that you have your PT and INR levels drawn exactly as directed.  Many foods, especially foods high in vitamin K can interfere with warfarin and affect the PT and INR results. Foods high in vitamin K include spinach, kale, broccoli, cabbage, collard and turnip greens, brussels sprouts, peas, cauliflower, seaweed, and parsley as well as beef and pork liver, green tea, and soybean oil. You should eat a consistent amount of foods high in vitamin K. Avoid major changes in your diet, or notify your caregiver before changing your diet. Arrange a visit with a dietitian to answer your questions.  Many medicines can interfere with warfarin and affect the PT and INR results. You must tell your caregiver about any and all medicines you take, this includes all vitamins and supplements. Be especially cautious with aspirin and anti-inflammatory medicines. Ask your caregiver before taking these. Do not take or discontinue any prescribed or over-the-counter medicine except on the advice of your caregiver or pharmacist.  Warfarin can have side effects, such as excessive bruising or bleeding. You will need to hold pressure over cuts for longer than usual.  Alcohol can change the body's ability to handle warfarin. It is best to avoid alcoholic drinks or consume only very small amounts while taking  warfarin. Notify your caregiver if you change your alcohol intake.  Notify your dentist or other caregivers before procedures.  Avoid contact sports.  Wear a medical alert bracelet or carry a medical alert card.  Ask your caregiver how soon you can go back to normal activities. Not being active can lead to new clots. Ask for a list of what you should and should not do.  Compression stockings. These are tight elastic stockings that apply pressure to the lower legs. This can help keep the blood in the legs from clotting. You may need to wear compressions stockings at home to help prevent clots.  Smoking. If you smoke, quit. Ask your caregiver for help with quitting smoking.  Learn as much as you can about PE. Educating yourself can help prevent PE from reoccurring. SEEK MEDICAL CARE IF:   You notice a rapid heartbeat.  You feel weaker or more tired than usual.  You feel faint.  You notice increased bruising.  Your symptoms are not getting better in the time expected.  You are having side effects of medicine. SEEK IMMEDIATE MEDICAL CARE IF:   You have chest pain.  You have trouble breathing.  You have new or increased swelling or pain in one leg.  You   cough up blood.  You notice blood in vomit, in a bowel movement, or in urine.  You have an oral temperature above 102 F (38.9 C), not controlled by medicine. You may have another PE. A blood clot in the lungs is a medical emergency. Call your local emergency services (911 in [place] to get to the nearest hospital or clinic. Do not drive yourself. MAKE SURE YOU:   Understand these instructions.  Will watch your condition.  Will get help right away if you are not doing well or get worse. Document Released: 05/08/2000 Document Revised: 11/10/2011 Document Reviewed: 11/12/2008 [employer] Patient Information 2014 [employer].  

## 2012-11-21 ENCOUNTER — Encounter: Payer: Self-pay | Admitting: Gastroenterology

## 2012-11-29 ENCOUNTER — Telehealth: Payer: Self-pay

## 2012-11-29 DIAGNOSIS — I2699 Other pulmonary embolism without acute cor pulmonale: Secondary | ICD-10-CM

## 2012-11-29 NOTE — Telephone Encounter (Signed)
Pt called [facility] requesting his results from last visit 11/18/12. Thanks

## 2012-11-29 NOTE — Telephone Encounter (Signed)
Any additional advisement on what/ if pt should do regarding the xray amd if this is a concern given recent inpatient visit?

## 2012-11-29 NOTE — Telephone Encounter (Signed)
Chest showed not much change in the clots The CBC was better bit his total protein and blood sugar were elevated ---will recheck these the next visit

## 2012-11-29 NOTE — Telephone Encounter (Signed)
It depends on how he feels-is he coughing up blood, in pain or SOB? The xray appearance is to be expected

## 2012-11-30 NOTE — Telephone Encounter (Signed)
Pt notified per MD and scheduled to follow up on Wednesday 12/07/12. He also would like to request a referral to see a Hematologist (recommended by a friend) to "find out possible reasons for why this PE happened and to rule out some things"

## 2012-11-30 NOTE — Telephone Encounter (Signed)
done

## 2012-12-07 ENCOUNTER — Ambulatory Visit (INDEPENDENT_AMBULATORY_CARE_PROVIDER_SITE_OTHER): Payer: BC Managed Care – PPO | Admitting: Internal Medicine

## 2012-12-07 ENCOUNTER — Encounter: Payer: Self-pay | Admitting: Internal Medicine

## 2012-12-07 ENCOUNTER — Ambulatory Visit: Payer: BC Managed Care – PPO | Admitting: Gastroenterology

## 2012-12-07 VITALS — BP 120/82 | HR 72 | Temp 97.9°F | Resp 16 | Ht 70.0 in | Wt 223.0 lb

## 2012-12-07 DIAGNOSIS — K921 Melena: Secondary | ICD-10-CM

## 2012-12-07 DIAGNOSIS — I10 Essential (primary) hypertension: Secondary | ICD-10-CM

## 2012-12-07 DIAGNOSIS — I2699 Other pulmonary embolism without acute cor pulmonale: Secondary | ICD-10-CM

## 2012-12-07 DIAGNOSIS — E669 Obesity, unspecified: Secondary | ICD-10-CM | POA: Insufficient documentation

## 2012-12-07 MED ORDER — RIVAROXABAN 20 MG PO TABS: 20.0000 mg | ORAL_TABLET | Freq: Every day | ORAL | Status: DC

## 2012-12-07 NOTE — Progress Notes (Signed)
  Subjective:    Patient ID: Willie Foster, male    DOB: Dec 15, 1965, 47 y.o.   MRN: [id number]  Hypertension This is a chronic problem. The current episode started more than 1 year ago. The problem has been gradually improving since onset. The problem is controlled. Pertinent negatives include no anxiety, blurred vision, chest pain, headaches, malaise/fatigue, neck pain, orthopnea, palpitations, peripheral edema, PND, shortness of breath or sweats. Past treatments include beta blockers and diuretics. The current treatment provides significant improvement. Compliance problems include diet.       Review of Systems  Constitutional: Negative.  Negative for fever, chills, malaise/fatigue, diaphoresis, activity change, appetite change, fatigue and unexpected weight change.  HENT: Negative.  Negative for neck pain.   Eyes: Negative.  Negative for blurred vision.  Respiratory: Negative.  Negative for cough, choking, chest tightness, shortness of breath, wheezing and stridor.   Cardiovascular: Negative.  Negative for chest pain, palpitations, orthopnea, leg swelling and PND.  Gastrointestinal: Negative.  Negative for nausea, vomiting, abdominal pain, diarrhea and constipation.  Endocrine: Negative.   Genitourinary: Negative.  Negative for hematuria.  Musculoskeletal: Negative.   Skin: Negative.   Allergic/Immunologic: Negative.   Neurological: Negative for dizziness, weakness, light-headedness and headaches.  Hematological: Negative.  Negative for adenopathy. Does not bruise/bleed easily.       Objective:   Physical Exam  Vitals reviewed. Constitutional: He is oriented to person, place, and time. He appears well-developed and well-nourished. No distress.  HENT:  Head: Normocephalic and atraumatic.  Mouth/Throat: Oropharynx is clear and moist. No oropharyngeal exudate.  Eyes: Conjunctivae are normal. Right eye exhibits no discharge. Left eye exhibits no discharge. No scleral icterus.  Neck:  Normal range of motion. Neck supple. No JVD present. No tracheal deviation present. No thyromegaly present.  Cardiovascular: Normal rate, regular rhythm, normal heart sounds and intact distal pulses.  Exam reveals no gallop and no friction rub.   No murmur heard. Pulmonary/Chest: Effort normal and breath sounds normal. No stridor. No respiratory distress. He has no wheezes. He has no rales. He exhibits no tenderness.  Abdominal: Soft. Bowel sounds are normal. He exhibits no distension and no mass. There is no tenderness. There is no rebound and no guarding.  Musculoskeletal: Normal range of motion. He exhibits no edema and no tenderness.  Lymphadenopathy:    He has no cervical adenopathy.  Neurological: He is oriented to person, place, and time.  Skin: Skin is warm and dry. No rash noted. He is not diaphoretic. No erythema. No pallor.  Psychiatric: He has a normal mood and affect. His behavior is normal. Judgment and thought content normal.     Lab Results  Component Value Date   WBC 4.9 11/18/2012   HGB 14.5 11/18/2012   HCT 43.0 11/18/2012   PLT 305.0 11/18/2012   GLUCOSE 125* 11/18/2012   CHOL 163 09/15/2012   TRIG 188.0* 09/15/2012   HDL 46.10 09/15/2012   LDLCALC 79 09/15/2012   ALT 45 11/18/2012   AST 29 11/18/2012   NA 136 11/18/2012   K 3.8 11/18/2012   CL 97 11/18/2012   CREATININE 1.0 11/18/2012   BUN 14 11/18/2012   CO2 28 11/18/2012   TSH 0.80 09/15/2012   PSA 0.68 09/15/2012   INR 1.00 11/13/2012       Assessment & Plan:

## 2012-12-07 NOTE — Assessment & Plan Note (Signed)
He has a remote history of blood in his stool but none recently I will refer him to GI again since he tells me that he has not heard anything about getting a colonoscopy done

## 2012-12-07 NOTE — Assessment & Plan Note (Signed)
His BP is well controlled 

## 2012-12-07 NOTE — Addendum Note (Signed)
Addended by: [name] on: 12/07/2012 04:44 PM   Modules accepted: Orders

## 2012-12-07 NOTE — Assessment & Plan Note (Signed)
Improvement noted His dose of xarelto comes down to 20 mg per day now

## 2012-12-07 NOTE — Assessment & Plan Note (Signed)
He is working on his lifestyle modifications to lower his weight

## 2012-12-07 NOTE — Patient Instructions (Signed)

## 2012-12-23 ENCOUNTER — Encounter: Payer: Self-pay | Admitting: Gastroenterology

## 2013-01-10 ENCOUNTER — Telehealth: Payer: Self-pay | Admitting: Oncology

## 2013-01-10 NOTE — Telephone Encounter (Signed)
C/D 01/10/13 for appt. 01/18/13

## 2013-01-10 NOTE — Telephone Encounter (Signed)
S/W PT IN RE IN RE TO NP APPT 08/27 @ 9:30 W/DR. [place] DX- PE WELCOME PACKET MAILED.

## 2013-01-11 ENCOUNTER — Encounter: Payer: Self-pay | Admitting: Internal Medicine

## 2013-01-11 ENCOUNTER — Other Ambulatory Visit (INDEPENDENT_AMBULATORY_CARE_PROVIDER_SITE_OTHER): Payer: BC Managed Care – PPO

## 2013-01-11 ENCOUNTER — Other Ambulatory Visit: Payer: BC Managed Care – PPO | Admitting: Lab

## 2013-01-11 ENCOUNTER — Ambulatory Visit (INDEPENDENT_AMBULATORY_CARE_PROVIDER_SITE_OTHER): Payer: BC Managed Care – PPO | Admitting: Internal Medicine

## 2013-01-11 VITALS — BP 118/82 | HR 56 | Temp 97.6°F | Resp 16 | Ht 69.0 in | Wt 219.3 lb

## 2013-01-11 DIAGNOSIS — Z5189 Encounter for other specified aftercare: Secondary | ICD-10-CM

## 2013-01-11 DIAGNOSIS — R7309 Other abnormal glucose: Secondary | ICD-10-CM

## 2013-01-11 DIAGNOSIS — R739 Hyperglycemia, unspecified: Secondary | ICD-10-CM | POA: Insufficient documentation

## 2013-01-11 DIAGNOSIS — E669 Obesity, unspecified: Secondary | ICD-10-CM

## 2013-01-11 DIAGNOSIS — I10 Essential (primary) hypertension: Secondary | ICD-10-CM

## 2013-01-11 DIAGNOSIS — I2699 Other pulmonary embolism without acute cor pulmonale: Secondary | ICD-10-CM

## 2013-01-11 LAB — BASIC METABOLIC PANEL
BUN: 13 mg/dL (ref 6–23)
Chloride: 99 mEq/L (ref 96–112)
GFR: 77.23 mL/min (ref >60.00)
Potassium: 3.9 mEq/L (ref 3.5–5.1)
Sodium: 137 mEq/L (ref 135–145)

## 2013-01-11 LAB — HEMOGLOBIN A1C: Hgb A1c MFr Bld: 5.6 % (ref 4.6–6.5)

## 2013-01-11 NOTE — Progress Notes (Signed)
  Subjective:    Patient ID: Willie Foster, male    DOB: [birth year], 47 y.o.   MRN: [id number]  Hypertension This is a chronic problem. The current episode started more than 1 year ago. The problem has been gradually improving since onset. The problem is controlled. Pertinent negatives include no anxiety, blurred vision, chest pain, headaches, malaise/fatigue, neck pain, orthopnea, palpitations, peripheral edema, PND, shortness of breath or sweats. Past treatments include beta blockers and diuretics. The current treatment provides moderate improvement. Compliance problems include exercise and diet.       Review of Systems  Constitutional: Negative.  Negative for fever, chills, malaise/fatigue, diaphoresis, activity change, appetite change, fatigue and unexpected weight change.  HENT: Negative for nosebleeds and neck pain.   Eyes: Negative.  Negative for blurred vision.  Respiratory: Negative.  Negative for apnea, cough, choking, chest tightness, shortness of breath, wheezing and stridor.   Cardiovascular: Negative.  Negative for chest pain, palpitations, orthopnea, leg swelling and PND.  Gastrointestinal: Negative.  Negative for nausea, vomiting, abdominal pain, diarrhea and constipation.  Endocrine: Negative.  Negative for polydipsia, polyphagia and polyuria.  Genitourinary: Negative.   Musculoskeletal: Negative.   Skin: Negative.   Allergic/Immunologic: Negative.   Neurological: Negative.  Negative for dizziness, tremors, syncope, weakness, light-headedness, numbness and headaches.  Hematological: Negative.  Negative for adenopathy. Does not bruise/bleed easily.  Psychiatric/Behavioral: Negative.        Objective:   Physical Exam  Vitals reviewed. Constitutional: He is oriented to person, place, and time. He appears well-developed and well-nourished. No distress.  HENT:  Head: Normocephalic and atraumatic.  Mouth/Throat: Oropharynx is clear and moist. No oropharyngeal exudate.   Eyes: Conjunctivae are normal. Right eye exhibits no discharge. Left eye exhibits no discharge. No scleral icterus.  Neck: Normal range of motion. Neck supple. No JVD present. No tracheal deviation present. No thyromegaly present.  Cardiovascular: Normal rate, regular rhythm, normal heart sounds and intact distal pulses.  Exam reveals no gallop and no friction rub.   No murmur heard. Pulmonary/Chest: Effort normal and breath sounds normal. No stridor. No respiratory distress. He has no wheezes. He has no rales. He exhibits no tenderness.  Abdominal: Soft. Bowel sounds are normal. He exhibits no distension and no mass. There is no tenderness. There is no rebound and no guarding.  Musculoskeletal: Normal range of motion. He exhibits no edema and no tenderness.  Lymphadenopathy:    He has no cervical adenopathy.  Neurological: He is oriented to person, place, and time.  Skin: Skin is warm and dry. No rash noted. He is not diaphoretic. No erythema. No pallor.     Lab Results  Component Value Date   WBC 4.9 11/18/2012   HGB 14.5 11/18/2012   HCT 43.0 11/18/2012   PLT 305.0 11/18/2012   GLUCOSE 125* 11/18/2012   CHOL 163 09/15/2012   TRIG 188.0* 09/15/2012   HDL 46.10 09/15/2012   LDLCALC 79 09/15/2012   ALT 45 11/18/2012   AST 29 11/18/2012   NA 136 11/18/2012   K 3.8 11/18/2012   CL 97 11/18/2012   CREATININE 1.0 11/18/2012   BUN 14 11/18/2012   CO2 28 11/18/2012   TSH 0.80 09/15/2012   PSA 0.68 09/15/2012   INR 1.00 11/13/2012       Assessment & Plan:

## 2013-01-11 NOTE — Patient Instructions (Signed)

## 2013-01-13 ENCOUNTER — Encounter: Payer: Self-pay | Admitting: Internal Medicine

## 2013-01-13 NOTE — Assessment & Plan Note (Signed)
His BP is well controlled Lytes and renal function are normal 

## 2013-01-13 NOTE — Assessment & Plan Note (Signed)
Some improvement noted

## 2013-01-13 NOTE — Assessment & Plan Note (Signed)
K+ is up in to the normal range now

## 2013-01-13 NOTE — Assessment & Plan Note (Signed)
He is doing well on xarelto

## 2013-01-13 NOTE — Assessment & Plan Note (Signed)
I will check his A1C to see if he has developed DM2

## 2013-01-14 ENCOUNTER — Other Ambulatory Visit: Payer: Self-pay | Admitting: Oncology

## 2013-01-14 DIAGNOSIS — I2699 Other pulmonary embolism without acute cor pulmonale: Secondary | ICD-10-CM

## 2013-01-15 ENCOUNTER — Telehealth: Payer: Self-pay | Admitting: Oncology

## 2013-01-15 NOTE — Telephone Encounter (Signed)
Message to [place] re new pt lb per 8/22 pof.

## 2013-01-18 ENCOUNTER — Encounter: Payer: Self-pay | Admitting: Oncology

## 2013-01-18 ENCOUNTER — Telehealth: Payer: Self-pay | Admitting: Oncology

## 2013-01-18 ENCOUNTER — Ambulatory Visit (HOSPITAL_BASED_OUTPATIENT_CLINIC_OR_DEPARTMENT_OTHER): Payer: BC Managed Care – PPO

## 2013-01-18 ENCOUNTER — Ambulatory Visit (HOSPITAL_BASED_OUTPATIENT_CLINIC_OR_DEPARTMENT_OTHER): Payer: BC Managed Care – PPO | Admitting: Oncology

## 2013-01-18 ENCOUNTER — Ambulatory Visit (HOSPITAL_BASED_OUTPATIENT_CLINIC_OR_DEPARTMENT_OTHER): Payer: BC Managed Care – PPO | Admitting: Lab

## 2013-01-18 VITALS — BP 125/75 | HR 55 | Temp 97.6°F | Resp 18 | Ht 69.0 in | Wt 221.3 lb

## 2013-01-18 DIAGNOSIS — D72819 Decreased white blood cell count, unspecified: Secondary | ICD-10-CM

## 2013-01-18 DIAGNOSIS — I2699 Other pulmonary embolism without acute cor pulmonale: Secondary | ICD-10-CM

## 2013-01-18 HISTORY — DX: Decreased white blood cell count, unspecified: D72.819

## 2013-01-18 LAB — LACTATE DEHYDROGENASE (CC13): LDH: 178 U/L (ref 125–245)

## 2013-01-18 LAB — CBC & DIFF AND RETIC
BASO%: 0 % (ref 0.0–2.0)
EOS%: 1.8 % (ref 0.0–7.0)
HCT: 40.6 % (ref 38.4–49.9)
Immature Retic Fract: 4.8 % (ref 3.00–10.60)
MCH: 26 pg — ABNORMAL LOW (ref 27.2–33.4)
MCHC: 34 g/dL (ref 32.0–36.0)
MONO#: 0.4 10*3/uL (ref 0.1–0.9)
NEUT%: 41.8 % (ref 39.0–75.0)
RDW: 14.8 % — ABNORMAL HIGH (ref 11.0–14.6)
Retic %: 1.05 % (ref 0.80–1.80)
Retic Ct Abs: 55.65 10*3/uL (ref 34.80–93.90)
WBC: 3.3 10*3/uL — ABNORMAL LOW (ref 4.0–10.3)
lymph#: 1.4 10*3/uL (ref 0.9–3.3)

## 2013-01-18 LAB — COMPREHENSIVE METABOLIC PANEL (CC13)
ALT: 45 U/L (ref 0–55)
BUN: 12.5 mg/dL (ref 7.0–26.0)
CO2: 25 mEq/L (ref 22–29)
Calcium: 9.8 mg/dL (ref 8.4–10.4)
Chloride: 107 mEq/L (ref 98–109)
Creatinine: 1 mg/dL (ref 0.7–1.3)
Glucose: 101 mg/dl (ref 70–140)
Total Bilirubin: 0.47 mg/dL (ref 0.20–1.20)

## 2013-01-18 LAB — MORPHOLOGY
PLT EST: ADEQUATE
RBC Comments: NORMAL

## 2013-01-18 NOTE — Telephone Encounter (Signed)
gv and printed appt sched and avs for pt for SEpt.

## 2013-01-18 NOTE — Progress Notes (Signed)
Checked in new patient with no financial issues. email address is correct.

## 2013-01-18 NOTE — Progress Notes (Signed)
New Patient Hematology-Oncology Evaluation   Willie Foster [id number] Oct 19, 1965 47 y.o. 01/18/2013  CC: Dr. Scarlette Calico   Reason for referral: Advice on anticoagulation in this man who recently had a unprovoked pulmonary embolus   HPI:  Pleasant 47 year old man in overall excellent health without any major medical or surgical illness. He started to develop vague, intermittent, right-sided chest pain which occurred on and off during the month of May. He had a worsening of symptoms and presented to a local urgent care center where he was advised to go to the emergency department. CT scan angiogram of the chest done on June 22 showed pulmonary emboli in the right middle and right lower lobes. He was admitted to the hospital and initially started on unfractionated heparin and then changed to Xarelto. His symptoms completely resolved. Venous Doppler studies of his lower extremities done on June 23 negative for clot.   CXR on admission June 22 no mass or infiltrate CT angiogram other than the pulmonary emboli showed no mass or adenopathy.  He had no recent prolonged travel, no infection, no immobilization; no surgery. He has no constitutional symptoms. He has no signs or symptoms of a collagen vascular disorder. He admits that on rare occasions when he strains at stools he sees a small amount of blood on the toilet tissue. He is scheduled for a colonoscopy next week.  There is a strong family history of cancer. His mother died of complications of breast cancer metastatic to brain. Father died of lung cancer. He has a 49 year old sister who has triplets. No one in the family with history of blood clots. He lost a child at age 1 with a ruptured cerebral aneurysm. He has no other children. He is single at this time but has a steady girlfriend. He has no HIV risk factors and specifically denies use of recreational drugs, male sex, or previous blood transfusions.   PMH: Past Medical History   Diagnosis Date  . Hyperlipidemia   . Hypertension   . Fatty liver disease, nonalcoholic 0973   he denies history of ulcers, hepatitis, yellow jaundice, thyroid disease, ulcers, asthma, tuberculosis, seizure, stroke. No arthritis condition. BPH listed in by his family physician but he was not aware that he has prostatic hyperplasia.  No past surgical history on file.  Allergies: Allergies  Allergen Reactions  . Lipitor [Atorvastatin]     Elevated LFT's    Medications: Tenoretic 50-25 one by mouth daily, Zyrtec 10 mg daily, potassium 20 mEq twice a day, Xarelto 20 mg daily, acyclovir 400 mg 3 times a day when necessary fever blisters, l-carnitine amino acid supplements twice a day.   Social History: He is a Secondary school teacher for [employer]. Single. Sexually active. He has never smoked.  He does not use illicit drugs.he drinks beer and wine.  Family History: Family History  Problem Relation Age of Onset  . Cancer Paternal Grandfather     Breast and Lung Cancer  . Hypertension Father   . Alcohol abuse Neg Hx   . COPD Neg Hx   . Diabetes Neg Hx   . Drug abuse Neg Hx   . Early death Neg Hx   . Heart disease Neg Hx   . Hyperlipidemia Neg Hx   . Kidney disease Neg Hx   . Stroke Neg Hx     Review of Systems: Constitutional symptoms: No constitutional symptoms HEENT: No sore throat  Respiratory: No cough or dyspnea Cardiovascular:  No chest pain or palpitations Gastrointestinal ROS:  No abdominal pain or change in bowel habit Genito-Urinary ROS: No urinary tract symptoms Hematological and Lymphatic: Musculoskeletal: No muscle bone or joint pain Neurologic: No headache or change in vision. No paresthesias. Dermatologic: No rash or ecchymosis Remaining ROS negative.  Physical Exam: Blood pressure 125/75, pulse 55, temperature 97.6 F (36.4 C), temperature source Oral, resp. rate 18, height 5' 9"  (1.753 m), weight 221 lb 4.8 oz (100.381 kg), SpO2 97.00%. Wt Readings from  Last 3 Encounters:  01/18/13 221 lb 4.8 oz (100.381 kg)  01/11/13 219 lb 4.8 oz (99.474 kg)  12/07/12 223 lb (101.152 kg)    General appearance: Muscular healthy-appearing African American man HENNT: Pharynx no erythema exudate or mass Lymph nodes: No cervical supraclavicular or axillary adenopathy. No inguinal adenopathy Breasts: Lungs: Clear to auscultation resonant to percussion Heart: Regular rhythm no murmur gallop Vascular: No carotid bruits, no cyanosis Abdominal: Soft, nontender, no mass, no organomegaly GU: Circumcised penis, no testicular masses. Extremities: No edema, no calf tenderness Neurologic: Mental status intact, PERRLA, optic discs sharp vessels normal, motor strength 5 over 5, reflexes 1+ symmetric Skin: No rash or ecchymosis    Lab Results: Lab Results: Hemoglobin on admission June 22 14.9, white count was 7100 with repeat values for the next 2 days also normal.   Component Value Date   WBC 3.3* 01/18/2013   HGB 13.8 01/18/2013   HCT 40.6 01/18/2013   MCV 76.6* 01/18/2013   PLT 153 01/18/2013  White count differential: 42% neutrophils, 43% lymphocytes, 13% monocytes, 2% eosinophils.   Chemistry      Component Value Date/Time   NA 137 01/11/2013 1345   K 3.9 01/11/2013 1345   CL 99 01/11/2013 1345   CO2 29 01/11/2013 1345   BUN 13 01/11/2013 1345   CREATININE 1.1 01/11/2013 1345      Component Value Date/Time   CALCIUM 9.7 01/11/2013 1345   ALKPHOS 71 11/18/2012 1214   AST 29 11/18/2012 1214   ALT 45 11/18/2012 1214   BILITOT 0.2* 11/18/2012 1214       Radiological Studies: See discussion above     Impression and Plan: #1. Uprovoked pulmonary embolus in an otherwise healthy young man.  I am going to screen him for  congenital and acquired coagulation defects to help guide decisions on duration of anticoagulation. In general, for an unprovoked pulmonary embolus, I anticoagulate people for 6 months-one year then reevaluate. We discussed the benefit versus  risks of the Xarelto.  I have scheduled a short interim followup visit to review outstanding lab data.  #2. Questionable guaiac positive stool Low clinical suspicion for underlying malignancy as etiology of his blood clot. He did decide to go ahead and schedule a colonoscopy.  #3. Acute leukopenia. If this persists, I am concerned that it would be secondary to the Xarelto. I'll repeat a CBC on his return visit here in 2 weeks.      Annia Belt, MD 01/18/2013, 12:37 PM

## 2013-01-20 LAB — LUPUS ANTICOAGULANT PANEL
DRVVT 1:1 Mix: 46.6 s — ABNORMAL HIGH
DRVVT: 95.7 s — ABNORMAL HIGH
Drvvt confirmation: 1.54 ratio — ABNORMAL HIGH
Lupus Anticoagulant: DETECTED — AB
PTT Lupus Anticoagulant: 42.5 s (ref 28.0–43.0)

## 2013-01-20 LAB — BETA-2 GLYCOPROTEIN ANTIBODIES
Beta-2 Glyco I IgG: 2 G Units
Beta-2-Glycoprotein I IgA: 0 A Units
Beta-2-Glycoprotein I IgM: 3 M Units

## 2013-01-20 LAB — PROTEIN S, ANTIGEN, FREE: Protein S Ag, Free: 126 %{normal} (ref 57–171)

## 2013-01-20 LAB — FACTOR 5 LEIDEN

## 2013-01-20 LAB — IMMUNOFIXATION ELECTROPHORESIS
IgA: 214 mg/dL (ref 68–379)
IgG (Immunoglobin G), Serum: 1290 mg/dL (ref 650–1600)
IgM, Serum: 58 mg/dL (ref 41–251)
Total Protein, Serum Electrophoresis: 7.5 g/dL (ref 6.0–8.3)

## 2013-01-20 LAB — PROTEIN C ACTIVITY: Protein C Activity: 171 % — ABNORMAL HIGH (ref 75–133)

## 2013-01-20 LAB — PROTHROMBIN GENE MUTATION

## 2013-01-20 LAB — CARDIOLIPIN ANTIBODIES, IGG, IGM, IGA
Anticardiolipin IgA: 3 APL U/mL (ref <22)
Anticardiolipin IgM: 4 MPL U/mL (ref <11)

## 2013-01-20 LAB — PROTEIN S ACTIVITY: Protein S Activity: 165 % — ABNORMAL HIGH (ref 69–129)

## 2013-01-24 ENCOUNTER — Encounter: Payer: Self-pay | Admitting: Gastroenterology

## 2013-01-25 ENCOUNTER — Ambulatory Visit: Payer: BC Managed Care – PPO | Admitting: Gastroenterology

## 2013-02-06 ENCOUNTER — Telehealth: Payer: Self-pay | Admitting: Internal Medicine

## 2013-02-06 ENCOUNTER — Ambulatory Visit (HOSPITAL_BASED_OUTPATIENT_CLINIC_OR_DEPARTMENT_OTHER): Payer: BC Managed Care – PPO | Admitting: Oncology

## 2013-02-06 ENCOUNTER — Other Ambulatory Visit: Payer: Self-pay | Admitting: *Deleted

## 2013-02-06 ENCOUNTER — Other Ambulatory Visit (HOSPITAL_BASED_OUTPATIENT_CLINIC_OR_DEPARTMENT_OTHER): Payer: BC Managed Care – PPO | Admitting: Lab

## 2013-02-06 VITALS — BP 138/87 | HR 63 | Temp 98.2°F | Resp 20 | Ht 69.0 in | Wt 224.3 lb

## 2013-02-06 DIAGNOSIS — I2699 Other pulmonary embolism without acute cor pulmonale: Secondary | ICD-10-CM

## 2013-02-06 DIAGNOSIS — D72819 Decreased white blood cell count, unspecified: Secondary | ICD-10-CM

## 2013-02-06 LAB — CBC WITH DIFFERENTIAL/PLATELET
BASO%: 0 % (ref 0.0–2.0)
Basophils Absolute: 0 10*3/uL (ref 0.0–0.1)
EOS%: 2.9 % (ref 0.0–7.0)
HCT: 37.9 % — ABNORMAL LOW (ref 38.4–49.9)
HGB: 12.8 g/dL — ABNORMAL LOW (ref 13.0–17.1)
MCH: 26.1 pg — ABNORMAL LOW (ref 27.2–33.4)
MCHC: 33.8 g/dL (ref 32.0–36.0)
MCV: 77.2 fL — ABNORMAL LOW (ref 79.3–98.0)
MONO%: 11.5 % (ref 0.0–14.0)
NEUT%: 44.6 % (ref 39.0–75.0)

## 2013-02-06 LAB — CHCC SMEAR

## 2013-02-06 MED ORDER — ATENOLOL-CHLORTHALIDONE 50-25 MG PO TABS: 1.0000 | ORAL_TABLET | Freq: Every day | ORAL | Status: DC

## 2013-02-06 NOTE — Telephone Encounter (Signed)
Pt [facility] 50-67m [facility] Please advise.

## 2013-02-07 NOTE — Progress Notes (Signed)
Hematology and Oncology Follow Up Visit  Willie Foster [id number] 06-Mar-1966 47 y.o. 02/07/2013 6:59 PM   Principle Diagnosis: Encounter Diagnosis  Name Primary?  . 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 Please see my office consultation note dated 01/18/2013 for full details.  He had no obvious personal or family risk factors. He is currently anticoagulated on Xarelto. D. dimer has normalized and was 0.27 on 01/18/2013. He was not anemic but apparently had a guaiac-positive stool in the hospital. A colonoscopy is planned for next month.  Special hematology studies: Negative for the presence of the factor V Leiden and the prothrombin gene mutations Normal functional protein S, free protein S, and protein C activity Normal antithrombin III Low positive anticardiolipin antibody IgG 31 units but negative beta-2 glycoprotein 1 antibodies including IgG subclass. Positive lupus anticoagulant likely spurious due to the use of Xarelto with a negative hexagonal phospholipid screen. Serum immunoglobulins normal and no monoclonal proteins on immunofixation electrophoresis.   Medications: reviewed  Allergies:  Allergies  Allergen Reactions  . Lipitor [Atorvastatin]     Elevated LFT's        Physical Exam: Blood pressure 138/87, pulse 63, temperature 98.2 F (36.8 C), temperature source Oral, resp. rate 20, height 5' 9"  (1.753 m), weight 224 lb 4.8 oz (101.742 kg). Wt Readings from Last 3 Encounters:  02/06/13 224 lb 4.8 oz (101.742 kg)  01/18/13 221 lb 4.8 oz (100.381 kg)  01/11/13 219 lb 4.8 oz (99.474 kg)       Lab Results: Lab Results  Component Value Date   WBC 3.5* 02/06/2013   HGB 12.8* 02/06/2013   HCT 37.9* 02/06/2013   MCV 77.2* 02/06/2013   PLT 155 02/06/2013  White count differential: 45 neutrophils, 41 lymphocytes, 11 monocytes, 3 eosinophils   Chemistry      Component Value Date/Time   NA 141 01/18/2013 1158   NA 137 01/11/2013 1345   K 3.6 01/18/2013 1158   K 3.9 01/11/2013 1345   CL 99 01/11/2013 1345   CO2 25 01/18/2013 1158   CO2 29 01/11/2013 1345   BUN 12.5 01/18/2013 1158   BUN 13 01/11/2013 1345   CREATININE 1.0 01/18/2013 1158   CREATININE 1.1 01/11/2013 1345      Component Value Date/Time   CALCIUM 9.8 01/18/2013 1158   CALCIUM 9.7 01/11/2013 1345   ALKPHOS 63 01/18/2013 1158   ALKPHOS 71 11/18/2012 1214   AST 30 01/18/2013 1158   AST 29 11/18/2012 1214   ALT 45 01/18/2013 1158   ALT 45 11/18/2012 1214   BILITOT 0.47 01/18/2013 1158   BILITOT 0.2* 11/18/2012 1214       Impression: Idiopathic unprovoked pulmonary emboli in an otherwise healthy 47 year old man with no obvious personal or family risk factors. Hypercoagulation screening is unrevealing for a congenital or acquired coagulopathy.  I discussed with him at length decision making with respect to duration of anticoagulation in his current situation. Most experts would anticoagulate for at least 6 months for an unprovoked pulmonary embolus and then reevaluate and consider extending anticoagulation for an additional 6 months or longer. He is young and otherwise healthy and an unprovoked pulmonary embolus is a life threatening event. I have kept some  patients   on anticoagulation indefinitely under these circumstances. I encouraged him to  keep his scheduled appointment for colonoscopy although I feel the chance of occult malignancy is negligible. I will see him again in December after he has been on anticoagulation for 6 months to reevaluate him.    Over 45 minutes was spent with this patient 100% of which was counseling   CC:. Dr. Larry Sierras   Annia Belt, MD 9/16/20146:59 PM

## 2013-02-08 ENCOUNTER — Telehealth: Payer: Self-pay | Admitting: Oncology

## 2013-02-22 ENCOUNTER — Ambulatory Visit: Payer: BC Managed Care – PPO | Admitting: Gastroenterology

## 2013-03-30 ENCOUNTER — Ambulatory Visit (INDEPENDENT_AMBULATORY_CARE_PROVIDER_SITE_OTHER): Payer: BC Managed Care – PPO | Admitting: Gastroenterology

## 2013-03-30 ENCOUNTER — Encounter: Payer: Self-pay | Admitting: Gastroenterology

## 2013-03-30 ENCOUNTER — Telehealth: Payer: Self-pay

## 2013-03-30 ENCOUNTER — Other Ambulatory Visit: Payer: Self-pay | Admitting: Oncology

## 2013-03-30 ENCOUNTER — Other Ambulatory Visit: Payer: Self-pay

## 2013-03-30 VITALS — BP 114/78 | HR 84 | Ht 69.75 in | Wt 218.2 lb

## 2013-03-30 DIAGNOSIS — Z7901 Long term (current) use of anticoagulants: Secondary | ICD-10-CM

## 2013-03-30 DIAGNOSIS — I2699 Other pulmonary embolism without acute cor pulmonale: Secondary | ICD-10-CM

## 2013-03-30 DIAGNOSIS — K921 Melena: Secondary | ICD-10-CM

## 2013-03-30 MED ORDER — PEG-KCL-NACL-NASULF-NA ASC-C 100 G PO SOLR: 1.0000 | Freq: Once | ORAL | Status: DC

## 2013-03-30 NOTE — Telephone Encounter (Signed)
Left a message on patient's voicemail going over Dr. Azucena Freed recommendations but told patient that he needs to contact there office to go over in detail when to start and stop his coumadin. Told patient that he will be on Lovenox and will need labs prior to his procedure but will also be coordinated with Dr. Azucena Freed office. Told patient to call me with any questions or concerns.

## 2013-03-30 NOTE — Patient Instructions (Signed)
You have been scheduled for a colonoscopy with propofol. Please follow written instructions given to you at your visit today.  Please pick up your prep kit at the pharmacy within the next 1-3 days. If you use inhalers (even only as needed), please bring them with you on the day of your procedure. Your physician has requested that you go to www.startemmi.com and enter the access code given to you at your visit today. This web site gives a general overview about your procedure. However, you should still follow specific instructions given to you by our office regarding your preparation for the procedure.   cc: Murriel Hopper, MD

## 2013-03-30 NOTE — Telephone Encounter (Signed)
Annia Belt, MD Marzella Schlein, CMA            OK

## 2013-03-30 NOTE — Progress Notes (Signed)
    History of Present Illness: This is a 47 year old male with a history of a pulmonary embolism currently maintained on Xarelto. His hypercoagulable workup was unremarkable. He has noted intermittent episodes of small amounts of blood in his stool for several months. His symptoms generally occur after a hard stool. Denies weight loss, abdominal pain, constipation, diarrhea, change in stool caliber, melena,  nausea, vomiting, dysphagia, reflux symptoms, chest pain.  Review of Systems: Pertinent positive and negative review of systems were noted in the above HPI section. All other review of systems were otherwise negative.  Current Medications, Allergies, Past Medical History, Past Surgical History, Family History and Social History were reviewed in [employer] record.  Physical Exam: General: Well developed , well nourished, no acute distress Head: Normocephalic and atraumatic Eyes:  sclerae anicteric, EOMI Ears: Normal auditory acuity Mouth: No deformity or lesions Neck: Supple, no masses or thyromegaly Lungs: Clear throughout to auscultation Heart: Regular rate and rhythm; no murmurs, rubs or bruits Abdomen: Soft, non tender and non distended. No masses, hepatosplenomegaly or hernias noted. Normal Bowel sounds Rectal: Deferred to colonoscopy Musculoskeletal: Symmetrical with no gross deformities  Skin: No lesions on visible extremities Pulses:  Normal pulses noted Extremities: No clubbing, cyanosis, edema or deformities noted Neurological: Alert oriented x 4, grossly nonfocal Cervical Nodes:  No significant cervical adenopathy Inguinal Nodes: No significant inguinal adenopathy Psychological:  Alert and cooperative. Normal mood and affect  Assessment and Recommendations:  1. Intermittent hematochezia. Increase dietary fiber and water intake. Rule out colorectal neoplasms, hemorrhoids and other disorders. The risks, benefits, and alternatives to colonoscopy with  possible biopsy and possible polypectomy were discussed with the patient and they consent to proceed.   2. History of pulmonary embolism maintained on Xarelto. Will contact Dr. Beryle Beams regarding a temporary hold of Xarelto for colonoscopy.

## 2013-03-30 NOTE — Telephone Encounter (Signed)
  03/30/2013   RE: Willie Foster DOB: 1965/06/21 MRN: [id number]   Dear Dr. Beryle Beams,    We have scheduled the above patient for an endoscopic procedure. Our records show that he is on anticoagulation therapy.   Please advise as to how long the patient may come off his therapy of Xarelto prior to the procedure, which is scheduled for 05/03/13.  Please fax back/ or route the completed form to [facility] at [phone].   Sincerely,    Marlon Pel, CMA

## 2013-03-30 NOTE — Telephone Encounter (Signed)
Message Received: Today     Annia Belt, MD Marzella Schlein, CMA Cc: P [facility] Pharmacists            Peri-procedure anticoagulation Recommendation for endoscopy 05/03/13:  Stop coumadin for just 3 dose (ex: Fri,Sat, Sun)  Check PT INR day 4 Proceed with procedure day 5.  Most people will be subtherapeutic.after holding 3 doses. If not, continue to hold and repeat day of procedure.  He is young. Chance of finding pathology needing biopsy low.  Resume coumadin night of procedure.  Start Lovenox 1.5 mg/kg SQ day after procedure and continue until coumadin therapeutic.  Lab testing can be coordinated through our office.   Dr Beryle Beams 03/30/13

## 2013-04-03 NOTE — Telephone Encounter (Signed)
Spoke with patient and informed him over the phone of Dr. Azucena Freed recommendations were and told patient if he does not here from there office in the next week to call them. Told him that he may need a lab appointment and also a visit to go over Lovenox but will leave it up to there office to arrange. Pt agreed and will call there office.

## 2013-04-03 NOTE — Telephone Encounter (Signed)
Left a message for patient to return my call. 

## 2013-04-04 ENCOUNTER — Telehealth: Payer: Self-pay | Admitting: Pharmacist

## 2013-04-04 NOTE — Telephone Encounter (Signed)
I s/w pt over phone today & advised him to stop Xarelto for 2 days prior to procedure (he will not take Xarelto on 12/8 - 12/10). Resume morning after procedure (12/11).  I cancelled the protimes ordered by Dr. Beryle Beams.  Pt does not need Lovenox bridging. Willie Foster, Pharm.D., CPP 04/04/2013@2 :12 PM

## 2013-05-03 ENCOUNTER — Encounter: Payer: Self-pay | Admitting: Gastroenterology

## 2013-05-03 ENCOUNTER — Ambulatory Visit (AMBULATORY_SURGERY_CENTER): Payer: BC Managed Care – PPO | Admitting: Gastroenterology

## 2013-05-03 VITALS — BP 126/78 | HR 53 | Temp 96.3°F | Resp 23 | Ht 69.0 in | Wt 218.0 lb

## 2013-05-03 DIAGNOSIS — K648 Other hemorrhoids: Secondary | ICD-10-CM

## 2013-05-03 DIAGNOSIS — K921 Melena: Secondary | ICD-10-CM

## 2013-05-03 LAB — HM COLONOSCOPY

## 2013-05-03 MED ORDER — SODIUM CHLORIDE 0.9 % IV SOLN: 500.0000 mL | INTRAVENOUS | Status: DC

## 2013-05-03 NOTE — Progress Notes (Signed)
Called to room to assist during endoscopic procedure.  Patient ID and intended procedure confirmed with present staff. Received instructions for my participation in the procedure from the performing physician.  

## 2013-05-03 NOTE — Op Note (Signed)
[facility]  [address]. [place], [address]   COLONOSCOPY PROCEDURE REPORT  PATIENT: Willie Foster, Willie Foster  MR#: [id number] BIRTHDATE: 1965-08-13 , 47  yrs. old GENDER: Male ENDOSCOPIST: Ladene Artist, MD, [facility] REFERRED AC:ZYSAYT Evalina Field, M.D. PROCEDURE DATE:  05/03/2013 PROCEDURE:   Colonoscopy, diagnostic and hemorrhoidectomy via sclerosing First Screening Colonoscopy - Avg.  risk and is 50 yrs.  old or older - No.  Prior Negative Screening - Now for repeat screening. N/A  History of Adenoma - Now for follow-up colonoscopy & has been > or = to 3 yrs.  N/A  Polyps Removed Today? No.  Recommend repeat exam, <10 yrs? No. ASA CLASS:   Class III INDICATIONS:hematochezia. MEDICATIONS: MAC sedation, administered by CRNA and propofol (Diprivan) 344m IV DESCRIPTION OF PROCEDURE:   After the risks benefits and alternatives of the procedure were thoroughly explained, informed consent was obtained.  A digital rectal exam revealed no abnormalities of the rectum.   The LB [id number] endoscope was introduced through the anus and advanced to the cecum, which was identified by both the appendix and ileocecal valve. No adverse events experienced.   The quality of the prep was good, using MoviPrep  The instrument was then slowly withdrawn as the colon was fully examined.  COLON FINDINGS: Mild diverticulosis was noted in the transverse colon.   The colon was otherwise normal.  There was no diverticulosis, inflammation, polyps or cancers unless previously stated.  Retroflexed views revealed  moderate internal hemorrhoids. 2.5 cc of 23.4% saline was injected into the internal hemorrhoids well above the dentate line. The time to cecum=1 minutes 52 seconds.  Withdrawal time=13 minutes 15 seconds.  The scope was withdrawn and the procedure completed. COMPLICATIONS: There were no complications.  ENDOSCOPIC IMPRESSION: 1.   Mild diverticulosis in the transverse colon 2.    Moderate internal hemorrhoids  RECOMMENDATIONS: 1.  High fiber diet with liberal fluid intake. 2.  You should continue to follow colorectal cancer screening guidelines for "routine risk" patients with a repeat colonoscopy in 10 years.  There is no need for FOBT (stool) testing for at least 5 years. 3.  Resume Xarelto tomorrow  eSigned:  MLadene Artist MD, [facility]12/02/2013 3:21 PM

## 2013-05-03 NOTE — Progress Notes (Signed)
Patient did not experience any of the following events: a burn prior to discharge; a fall within the facility; wrong site/side/patient/procedure/implant event; or a hospital transfer or hospital admission upon discharge from the facility. (G8907) Patient did not have preoperative order for IV antibiotic SSI prophylaxis. (G8918)  

## 2013-05-03 NOTE — Patient Instructions (Signed)
Discharge instructions given with verbal understanding. Handouts on diverticulosis and hemorrhoids. Resume previous medications. YOU HAD AN ENDOSCOPIC PROCEDURE TODAY AT [facility]: Refer to the procedure report that was given to you for any specific questions about what was found during the examination.  If the procedure report does not answer your questions, please call your gastroenterologist to clarify.  If you requested that your care partner not be given the details of your procedure findings, then the procedure report has been included in a sealed envelope for you to review at your convenience later.  YOU SHOULD EXPECT: Some feelings of bloating in the abdomen. Passage of more gas than usual.  Walking can help get rid of the air that was put into your GI tract during the procedure and reduce the bloating. If you had a lower endoscopy (such as a colonoscopy or flexible sigmoidoscopy) you may notice spotting of blood in your stool or on the toilet paper. If you underwent a bowel prep for your procedure, then you may not have a normal bowel movement for a few days.  DIET: Your first meal following the procedure should be a light meal and then it is ok to progress to your normal diet.  A half-sandwich or bowl of soup is an example of a good first meal.  Heavy or fried foods are harder to digest and may make you feel nauseous or bloated.  Likewise meals heavy in dairy and vegetables can cause extra gas to form and this can also increase the bloating.  Drink plenty of fluids but you should avoid alcoholic beverages for 24 hours.  ACTIVITY: Your care partner should take you home directly after the procedure.  You should plan to take it easy, moving slowly for the rest of the day.  You can resume normal activity the day after the procedure however you should NOT DRIVE or use heavy machinery for 24 hours (because of the sedation medicines used during the test).    SYMPTOMS TO REPORT  IMMEDIATELY: A gastroenterologist can be reached at any hour.  During normal business hours, 8:30 AM to 5:00 PM Monday through Friday, call [phone].  After hours and on weekends, please call the GI answering service at [phone] who will take a message and have the physician on call contact you.   Following lower endoscopy (colonoscopy or flexible sigmoidoscopy):  Excessive amounts of blood in the stool  Significant tenderness or worsening of abdominal pains  Swelling of the abdomen that is new, acute  Fever of 100F or higher  FOLLOW UP: If any biopsies were taken you will be contacted by phone or by letter within the next 1-3 weeks.  Call your gastroenterologist if you have not heard about the biopsies in 3 weeks.  Our staff will call the home number listed on your records the next business day following your procedure to check on you and address any questions or concerns that you may have at that time regarding the information given to you following your procedure. This is a courtesy call and so if there is no answer at the home number and we have not heard from you through the emergency physician on call, we will assume that you have returned to your regular daily activities without incident.  SIGNATURES/CONFIDENTIALITY: You and/or your care partner have signed paperwork which will be entered into your electronic medical record.  These signatures attest to the fact that that the information above on your After Visit Summary  has been reviewed and is understood.  Full responsibility of the confidentiality of this discharge information lies with you and/or your care-partner.

## 2013-05-03 NOTE — Progress Notes (Signed)
Lidocaine-40mg IV prior to Propofol InductionPropofol given over incremental dosages 

## 2013-05-04 ENCOUNTER — Telehealth: Payer: Self-pay | Admitting: *Deleted

## 2013-05-04 NOTE — Telephone Encounter (Signed)
  Lm on vm that identifies pt by first and last name to return call if problems or concerns. ewm

## 2013-05-12 ENCOUNTER — Ambulatory Visit (HOSPITAL_BASED_OUTPATIENT_CLINIC_OR_DEPARTMENT_OTHER): Payer: BC Managed Care – PPO | Admitting: Oncology

## 2013-05-12 ENCOUNTER — Other Ambulatory Visit (HOSPITAL_BASED_OUTPATIENT_CLINIC_OR_DEPARTMENT_OTHER): Payer: BC Managed Care – PPO

## 2013-05-12 VITALS — BP 113/76 | HR 60 | Temp 97.6°F | Resp 18 | Ht 69.0 in | Wt 219.7 lb

## 2013-05-12 DIAGNOSIS — I2699 Other pulmonary embolism without acute cor pulmonale: Secondary | ICD-10-CM

## 2013-05-12 DIAGNOSIS — Z7901 Long term (current) use of anticoagulants: Secondary | ICD-10-CM

## 2013-05-12 DIAGNOSIS — K921 Melena: Secondary | ICD-10-CM

## 2013-05-12 LAB — CBC WITH DIFFERENTIAL/PLATELET
BASO%: 0.4 % (ref 0.0–2.0)
Basophils Absolute: 0 10*3/uL (ref 0.0–0.1)
EOS%: 2 % (ref 0.0–7.0)
HGB: 13.3 g/dL (ref 13.0–17.1)
MCH: 24.9 pg — ABNORMAL LOW (ref 27.2–33.4)
MCHC: 31.8 g/dL — ABNORMAL LOW (ref 32.0–36.0)
MONO#: 0.5 10*3/uL (ref 0.1–0.9)
MONO%: 12.8 % (ref 0.0–14.0)
RDW: 14 % (ref 11.0–14.6)
WBC: 4.2 10*3/uL (ref 4.0–10.3)
lymph#: 1.8 10*3/uL (ref 0.9–3.3)

## 2013-05-13 NOTE — Progress Notes (Signed)
Hematology and Oncology Follow Up Visit  Willie Foster [id number] 1965-06-27 47 y.o. 05/13/2013 4:53 PM   Principle Diagnosis:No diagnosis found.   Interim History:  Followup visit for this pleasant 47 year old man who sustained an unprovoked pulmonary embolism in June of 2014. He is currently on anticoagulation with Xarelto. Hypercoagulation evaluation unremarkable. He was mildly anemic and had some intermittent guaiac-positive stools. I referred him for colonoscopy. This was just done last week on 05/03/2013 and was normal. He had some nonbleeding internal hemorrhoids.  He is asymptomatic at this time. No dyspnea. No chest pain or palpitations.  D.-dimer done on August 27 was normal at 0.27. This compares to 1.24 at time of diagnosis of his PE.  Medications: reviewed  Allergies:  Allergies  Allergen Reactions  . Lipitor [Atorvastatin]     Elevated LFT's    Review of Systems: Hematology:  No bleeding or bruising ENT ROS: No sore throat Breast ROS:  Respiratory ROS: No cough or dyspnea Cardiovascular ROS:   No chest pain or palpitations Gastrointestinal ROS:  No abdominal pain or change in bowel habit Genito-Urinary ROS: No urinary tract symptoms Musculoskeletal ROS no muscle bone or joint pain Neurological ROS: No headache or change in vision Dermatological ROS: No rash Remaining ROS negative.  Physical Exam: Blood pressure 113/76, pulse 60, temperature 97.6 F (36.4 C), temperature source Oral, resp. rate 18, height 5' 9"  (1.753 m), weight 219 lb 11.2 oz (99.655 kg). Wt Readings from Last 3 Encounters:  05/12/13 219 lb 11.2 oz (99.655 kg)  05/03/13 218 lb (98.884 kg)  03/30/13 218 lb 4 oz (98.998 kg)     General appearance: Well-nourished African American man looking younger than his stated age HENNT: Pharynx no erythema, exudate, mass, or ulcer. No thyromegaly or thyroid nodules Lymph nodes: No cervical, supraclavicular, or axillary lymphadenopathy Breasts:   Lungs: Clear to auscultation, resonant to percussion throughout Heart: Regular rhythm, no murmur, no gallop, no rub, no click, no edema Abdomen: Soft, nontender, normal bowel sounds, no mass, no organomegaly Extremities: No edema, no calf tenderness Musculoskeletal: no joint deformities GU:  Vascular: Carotid pulses 2+, no bruits,  Neurologic: Alert, oriented, PERRLA, optic discs sharp and vessels normal, no hemorrhage or exudate, cranial nerves grossly normal, motor strength 5 over 5, reflexes 1+ symmetric, upper body coordination normal, gait normal, Skin: No rash or ecchymosis  Lab Results: CBC W/Diff    Component Value Date/Time   WBC 4.2 05/12/2013 1536   WBC 4.9 11/18/2012 1214   RBC 5.37 05/12/2013 1536   RBC 5.45 11/18/2012 1214   RBC 5.50 11/18/2012 1214   HGB 13.3 05/12/2013 1536   HGB 14.5 11/18/2012 1214   HCT 41.9 05/12/2013 1536   HCT 43.0 11/18/2012 1214   PLT 190 05/12/2013 1536   PLT 305.0 11/18/2012 1214   MCV 78.2* 05/12/2013 1536   MCV 78.9 11/18/2012 1214   MCH 24.9* 05/12/2013 1536   MCH 26.1 11/15/2012 0500   MCHC 31.8* 05/12/2013 1536   MCHC 33.8 11/18/2012 1214   RDW 14.0 05/12/2013 1536   RDW 13.9 11/18/2012 1214   LYMPHSABS 1.8 05/12/2013 1536   LYMPHSABS 1.2 11/18/2012 1214   MONOABS 0.5 05/12/2013 1536   MONOABS 0.6 11/18/2012 1214   EOSABS 0.1 05/12/2013 1536   EOSABS 0.2 11/18/2012 1214   BASOSABS 0.0 05/12/2013 1536   BASOSABS 0.0 11/18/2012 1214     Chemistry      Component Value Date/Time   NA 141 01/18/2013 1158   NA 137 01/11/2013 1345  K 3.6 01/18/2013 1158   K 3.9 01/11/2013 1345   CL 99 01/11/2013 1345   CO2 25 01/18/2013 1158   CO2 29 01/11/2013 1345   BUN 12.5 01/18/2013 1158   BUN 13 01/11/2013 1345   CREATININE 1.0 01/18/2013 1158   CREATININE 1.1 01/11/2013 1345      Component Value Date/Time   CALCIUM 9.8 01/18/2013 1158   CALCIUM 9.7 01/11/2013 1345   ALKPHOS 63 01/18/2013 1158   ALKPHOS 71 11/18/2012 1214   AST 30 01/18/2013 1158    AST 29 11/18/2012 1214   ALT 45 01/18/2013 1158   ALT 45 11/18/2012 1214   BILITOT 0.47 01/18/2013 1158   BILITOT 0.2* 11/18/2012 1214      Impression:  Unprovoked pulmonary embolus in an otherwise healthy young man. We again discussed the risk versus benefit of continuing anticoagulation for a defined period of time versus indefinitely. I will keep him on the Xarelto for a minimum of one year and then reevaluate.  I will be joining the [facility] internal medicine faculty and April. He would like to continue to see me at the internal medicine clinic there.   CC: Patient Care Team: [name], MD as PCP - General (Internal Medicine)   Annia Belt, MD 12/20/20144:53 PM

## 2013-06-30 ENCOUNTER — Telehealth: Payer: Self-pay | Admitting: *Deleted

## 2013-06-30 MED ORDER — ACYCLOVIR 400 MG PO TABS: 400.0000 mg | ORAL_TABLET | Freq: Three times a day (TID) | ORAL | Status: DC | PRN

## 2013-06-30 NOTE — Telephone Encounter (Signed)
Phoned patient and left voicemail of MD's response and orders.

## 2013-06-30 NOTE — Telephone Encounter (Signed)
Patient phoned requesting script for zovarax 400 mg tab po tid prn.  Last filled 07/16/12.  Last OV with PCP 01/11/13  Please advise  CB# [phone]

## 2013-06-30 NOTE — Telephone Encounter (Signed)
done

## 2013-08-15 ENCOUNTER — Other Ambulatory Visit: Payer: Self-pay | Admitting: Internal Medicine

## 2013-08-15 ENCOUNTER — Ambulatory Visit (INDEPENDENT_AMBULATORY_CARE_PROVIDER_SITE_OTHER): Payer: BC Managed Care – PPO | Admitting: Family Medicine

## 2013-08-15 ENCOUNTER — Encounter: Payer: Self-pay | Admitting: Family Medicine

## 2013-08-15 VITALS — BP 108/78 | HR 60 | Wt 224.0 lb

## 2013-08-15 DIAGNOSIS — M999 Biomechanical lesion, unspecified: Secondary | ICD-10-CM | POA: Insufficient documentation

## 2013-08-15 DIAGNOSIS — M533 Sacrococcygeal disorders, not elsewhere classified: Secondary | ICD-10-CM | POA: Insufficient documentation

## 2013-08-15 MED ORDER — MELOXICAM 15 MG PO TABS: 15.0000 mg | ORAL_TABLET | Freq: Every day | ORAL | Status: DC

## 2013-08-15 NOTE — Assessment & Plan Note (Signed)
Decision today to treat with OMT was based on Physical Exam  After verbal consent patient was treated with HVLA, ME techniques in sacral areas  Patient tolerated the procedure well with improvement in symptoms  Patient given exercises, stretches and lifestyle modifications  See medications in patient instructions if given  Patient will follow up in 2-3 weeks

## 2013-08-15 NOTE — Patient Instructions (Addendum)
Nice to meet you Try meloxicam daily for 3 days then as needed Ice 20 minutes 2 times a day Try exercises most days of the week.  Sacroiliac Joint Mobilization and Rehab 1. Work on pretzel stretching, shoulder back and leg draped in front. 3-5 sets, 30 sec.. 2. hip abductor rotations. standing, hip flexion and rotation outward then inward. 3 sets, 15 reps. when can do comfortably, add ankle weights starting at 2 pounds.  3. cross over stretching - shoulder back to ground, same side leg crossover. 3-5 sets for 30 min..  4. rolling up and back knees to chest and rocking. 5. sacral tilt - 5 sets, hold for 5-10 seconds Vitamin D 2000 IU daily  Turmeric 571m twice daily  Come back in 2 weeks.

## 2013-08-15 NOTE — Progress Notes (Signed)
  [name] [address] [place], [place] [address] Phone: [phone] Subjective:     CC: Low back pain.   [id number] Willie Foster is a 48 y.o. male coming in with complaint of low back pain. Patient states he has had this intermittently for some time. Patient states he does have a past medical history significant for sciatica at one point. Patient states that this has not quite the same pain. Patient states is mostly on the left side and points to his lower back and the L5-S1 region. Patient states hurts more with certain movements can be nonpainful when he is standing. Patient states it usually is from a lying down to sitting position or sitting to standing position he notices it more. Patient that the pain can be bad during the process of a long day. Patient has tried over-the-counter anti-inflammatories with minimal benefit. Patient is on anticoagulation at baseline. Patient denies any fevers or chills or any abnormal weight loss. Patient denies any radiation down his leg or any muscle weakness of the large hamate. Patient states that night he can be discomfort. Patient puts the severity a 4/10.     Past medical history, social, surgical and family history all reviewed in electronic medical record.   Review of Systems: No headache, visual changes, nausea, vomiting, diarrhea, constipation, dizziness, abdominal pain, skin rash, fevers, chills, night sweats, weight loss, swollen lymph nodes, body aches, joint swelling, muscle aches, chest pain, shortness of breath, mood changes.   Objective Blood pressure 108/78, pulse 60, weight 224 lb (101.606 kg), SpO2 98.00%.  General: No apparent distress alert and oriented x3 mood and affect normal, dressed appropriately. Poor core strength HEENT: Pupils equal, extraocular movements intact  Respiratory: Patient's speak in full sentences and does not appear short of breath  Cardiovascular: No lower extremity edema,  non tender, no erythema  Skin: Warm dry intact with no signs of infection or rash on extremities or on axial skeleton.  Abdomen: Soft nontender  Neuro: Cranial nerves II through XII are intact, neurovascularly intact in all extremities with 2+ DTRs and 2+ pulses.  Lymph: No lymphadenopathy of posterior or anterior cervical chain or axillae bilaterally.  Gait normal with good balance and coordination.  MSK:  Non tender with full range of motion and good stability and symmetric strength and tone of shoulders, elbows, wrist, hip, knee and ankles bilaterally.  Back Exam:  Inspection: Mild increase in lordosis Motion: Flexion 35 deg, Extension 35 deg, Side Bending to 45 deg bilaterally,  Rotation to 45 deg bilaterally  SLR laying: Negative  XSLR laying: Negative  Palpable tenderness: Tender to palpation over the left SI joint. FABER: Positive left. Sensory change: Gross sensation intact to all lumbar and sacral dermatomes.  Reflexes: 2+ at both patellar tendons, 2+ at achilles tendons, Babinski's downgoing.  Strength at foot  Plantar-flexion: 5/5 Dorsi-flexion: 5/5 Eversion: 5/5 Inversion: 5/5  Leg strength  Quad: 5/5 Hamstring: 5/5 Hip flexor: 5/5 Hip abductors: 5/5  Gait unremarkable. Osteopathic findings Sacrum left on left    Impression and Recommendations:     This case required medical decision making of moderate complexity.

## 2013-08-15 NOTE — Assessment & Plan Note (Signed)
Patient's pain seems to be related to the sacroiliac joint dysfunction. The patient sciatica previously with secondary to tightness of the piriformis probably secondary to this sacroiliac joint dysfunction. Patient is also having compensation with increased lordosis lumbar spine which is increasing the stress on the sacroiliac joint. Patient was given for exercise as well as range of motion exercises to try to help this. Patient is on a anticoagulants will only doing three-day burst of anti-inflammatories. We discussed about over-the-counter medications he can be beneficial and anti-inflammatory without any significant side effects or interactions with his other medications. Patient will also do an icing protocol. Patient did respond well to manipulation therapy and will come back again in 2-3 weeks for further evaluation.

## 2013-08-30 ENCOUNTER — Ambulatory Visit (INDEPENDENT_AMBULATORY_CARE_PROVIDER_SITE_OTHER): Payer: BC Managed Care – PPO | Admitting: Family Medicine

## 2013-08-30 ENCOUNTER — Encounter: Payer: Self-pay | Admitting: Internal Medicine

## 2013-08-30 ENCOUNTER — Encounter: Payer: Self-pay | Admitting: Family Medicine

## 2013-08-30 ENCOUNTER — Ambulatory Visit (INDEPENDENT_AMBULATORY_CARE_PROVIDER_SITE_OTHER): Payer: BC Managed Care – PPO | Admitting: Internal Medicine

## 2013-08-30 VITALS — BP 126/84 | HR 74

## 2013-08-30 VITALS — BP 126/84 | HR 74 | Temp 97.7°F | Resp 12

## 2013-08-30 DIAGNOSIS — M533 Sacrococcygeal disorders, not elsewhere classified: Secondary | ICD-10-CM

## 2013-08-30 DIAGNOSIS — H6590 Unspecified nonsuppurative otitis media, unspecified ear: Secondary | ICD-10-CM

## 2013-08-30 DIAGNOSIS — M999 Biomechanical lesion, unspecified: Secondary | ICD-10-CM

## 2013-08-30 DIAGNOSIS — J019 Acute sinusitis, unspecified: Secondary | ICD-10-CM

## 2013-08-30 MED ORDER — CHOLECALCIFEROL 50 MCG (2000 UT) PO CAPS: 1.0000 | ORAL_CAPSULE | Freq: Every day | ORAL | Status: DC

## 2013-08-30 MED ORDER — AMOXICILLIN 500 MG PO CAPS: 500.0000 mg | ORAL_CAPSULE | Freq: Three times a day (TID) | ORAL | Status: DC

## 2013-08-30 MED ORDER — TURMERIC 500 MG PO CAPS: 1.0000 | ORAL_CAPSULE | Freq: Two times a day (BID) | ORAL | Status: AC

## 2013-08-30 NOTE — Patient Instructions (Signed)

## 2013-08-30 NOTE — Progress Notes (Signed)
   Subjective:    Patient ID: Willie Foster, male    DOB: 1966/05/18, 48 y.o.   MRN: [id number]  HPI  He developed tract symptoms 2 weeks ago manifested as pharyngitis, rhinitis, head congestion, and chest congestion. Symptoms began after exposure to sick coworker  Roughly 6 days ago he developed some pressure associated with hearing loss in the left ear.  At this time he continues to have some yellow nasal and chest secretions, greater volume from the head.  He has taken DayQuil and NyQuil as well as Zyrtec. He found most relief with Zyrtec daily.  He has a history of "wet ear" on the left. He has developed allergic symptoms since moving from [place].   Review of Systems  He denies frontal sinus pain, facial pain, dental pain, or otic discharge.  He is not having fever, chills, or sweats.       Objective:   Physical Exam General appearance:good health ;well nourished; no acute distress or increased work of breathing is present.  Minor L cervical lymphadenopathy ;none in axilla  Eyes: No conjunctival inflammation or lid edema is present.  Ears:  External ear exam shows no significant lesions or deformities.  Otoscopic examination reveals gray , boggy LTM; R TM WNL.  Nose:  External nasal examination shows no deformity or inflammation. Nasal mucosa are pink and moist without lesions or exudates. No septal dislocation or deviation.No obstruction to airflow.   Oral exam: Dental hygiene is good; lips and gums are healthy appearing.There is no oropharyngeal erythema or exudate noted.   Neck:  No deformities, masses, or tenderness noted.   Supple with full range of motion without pain.   Heart:  Normal rate and regular rhythm. S1 and S2 normal without gallop, murmur, click, rub or other extra sounds.   Lungs:Chest clear to auscultation; no wheezes, rhonchi,rales ,or rubs present.No increased work of breathing.    Extremities:  No cyanosis, edema, or clubbing  noted    Skin: Warm  & dry w/o jaundice or tenting.         Assessment & Plan:  #1 L serous otitis media See orders

## 2013-08-30 NOTE — Progress Notes (Signed)
  [name] [address] [place],  [address] Phone: [phone] Subjective:     CC: Low back pain follow up.   HYI:[id number] Willie Foster is a 48 y.o. male coming in with complaint of low back pain. Patient was seen previously and was diagnosed with a sacroiliac joint dysfunction. Patient was given home exercises, discussed over-the-counter medications, as well as an icing protocol. Patient states he is doing approximately 50% better. Patient states he was pain-free for approximately 1 week and then pain started to come back slowly. Patient notices it more still when he is sitting for longer amount of time. Denies any of a sciatica going down his leg. Denies any bowel or bladder problems. Denies any new symptoms. Patient has been doing the exercises intermittently and did not give me in the over-the-counter medications. Patient has taken the meloxicam as needed maybe 3 or 4 times which does make improvement. Patient is on a blood thinner so we do not want to do this on a regular basis.     Past medical history, social, surgical and family history all reviewed in electronic medical record.   Review of Systems: No headache, visual changes, nausea, vomiting, diarrhea, constipation, dizziness, abdominal pain, skin rash, fevers, chills, night sweats, weight loss, swollen lymph nodes, body aches, joint swelling, muscle aches, chest pain, shortness of breath, mood changes.   Objective Blood pressure 126/84, pulse 74, SpO2 97.00%.  General: No apparent distress alert and oriented x3 mood and affect normal, dressed appropriately. Poor core strength HEENT: Pupils equal, extraocular movements intact  Respiratory: Patient's speak in full sentences and does not appear short of breath  Cardiovascular: No lower extremity edema, non tender, no erythema  Skin: Warm dry intact with no signs of infection or rash on extremities or on axial skeleton.  Abdomen: Soft  nontender  Neuro: Cranial nerves II through XII are intact, neurovascularly intact in all extremities with 2+ DTRs and 2+ pulses.  Lymph: No lymphadenopathy of posterior or anterior cervical chain or axillae bilaterally.  Gait normal with good balance and coordination.  MSK:  Non tender with full range of motion and good stability and symmetric strength and tone of shoulders, elbows, wrist, hip, knee and ankles bilaterally.  Back Exam:  Inspection: Mild increase in lordosis Motion: Flexion 35 deg, Extension 35 deg, Side Bending to 45 deg bilaterally,  Rotation to 45 deg bilaterally  SLR laying: Negative  XSLR laying: Negative  Palpable tenderness: Tender to palpation over the left SI joint. FABER: Positive left. Sensory change: Gross sensation intact to all lumbar and sacral dermatomes.  Reflexes: 2+ at both patellar tendons, 2+ at achilles tendons, Babinski's downgoing.  Strength at foot  Plantar-flexion: 5/5 Dorsi-flexion: 5/5 Eversion: 5/5 Inversion: 5/5  Leg strength  Quad: 5/5 Hamstring: 5/5 Hip flexor: 5/5 Hip abductors: 5/5  Gait unremarkable.  Osteopathic findings Sacrum left on left    Impression and Recommendations:     This case required medical decision making of moderate complexity.   Spent greater than 25 minutes with patient face-to-face and had greater than 50% of counseling including as described above in assessment and plan.

## 2013-08-30 NOTE — Assessment & Plan Note (Signed)
Decision today to treat with OMT was based on Physical Exam  After verbal consent patient was treated with HVLA, ME techniques in sacral areas  Patient tolerated the procedure well with improvement in symptoms  Patient given exercises, stretches and lifestyle modifications  See medications in patient instructions if given  Patient will follow up in 3-4 weeks

## 2013-08-30 NOTE — Progress Notes (Signed)
Pre visit review using our clinic review tool, if applicable. No additional management support is needed unless otherwise documented below in the visit note. 

## 2013-08-30 NOTE — Patient Instructions (Addendum)
Good to see you Turmeric 572m twice daily Vitamin D 2000 iu daily.  Continue exercises 3 times a week at least Walk every 45 minutes for at least 10 minutes.  See me again in 3-4 weeks.

## 2013-08-30 NOTE — Assessment & Plan Note (Signed)
Patient continues to do remarkably well. Patient given other home exercises and discuss core strengthening that could be better. Discuss different postural changes at work and starting to ambulate more while he is at work. Patient will come back again in 4-6 weeks for further evaluation. Patient has responded well to manipulation.

## 2013-09-05 ENCOUNTER — Ambulatory Visit: Payer: BC Managed Care – PPO | Admitting: Cardiology

## 2013-09-13 ENCOUNTER — Ambulatory Visit: Payer: BC Managed Care – PPO | Admitting: Internal Medicine

## 2013-12-12 ENCOUNTER — Ambulatory Visit (INDEPENDENT_AMBULATORY_CARE_PROVIDER_SITE_OTHER): Payer: BC Managed Care – PPO | Admitting: Family Medicine

## 2013-12-12 ENCOUNTER — Encounter: Payer: Self-pay | Admitting: Family Medicine

## 2013-12-12 VITALS — BP 122/82 | HR 66 | Ht 69.0 in | Wt 229.0 lb

## 2013-12-12 DIAGNOSIS — M999 Biomechanical lesion, unspecified: Secondary | ICD-10-CM

## 2013-12-12 DIAGNOSIS — M533 Sacrococcygeal disorders, not elsewhere classified: Secondary | ICD-10-CM

## 2013-12-12 NOTE — Assessment & Plan Note (Addendum)
Decision today to treat with OMT was based on Physical Exam  After verbal consent patient was treated with HVLA, ME techniques in sacral areas  Patient tolerated the procedure well with improvement in symptoms  Patient given exercises, stretches and lifestyle modifications  See medications in patient instructions if given  Patient will follow up in 2-3 weeks

## 2013-12-12 NOTE — Patient Instructions (Signed)
Good to see you Continue the exercises.  Ice 20 minutes 2 times daily. Usually after activity and before bed. Try the medicine for next 3 days.  Message is great.  Come back in 2 weeks.

## 2013-12-12 NOTE — Assessment & Plan Note (Signed)
I believe patient just had a can acute exacerbation of his back pain. Patient was given home exercise program to do again on a regular basis. Patient was given medications and was given a trial size of anti-inflammatories. We discussed an icing regimen. Patient will try these interventions and come back again in 2 weeks for further evaluation and treatment.  Spent greater than 25 minutes with patient face-to-face and had greater than 50% of counseling including as described above in assessment and plan.

## 2013-12-12 NOTE — Progress Notes (Signed)
  [facility] [address] [place], [place] [address] Phone: [phone] Subjective:     CC: Low back pain follow up.   [id number] Willie Foster is a 48 y.o. male coming in with complaint of low back pain. This was seen previously and had a sacroiliac joint dysfunction. Patient had been doing very well for the last 4 months.  Patient states that starting yesterday he started having low back pain. Patient states it is very similar to what he was having previously. Started of the blue. Has to take a muscle relaxer which has helped. Patient has not taken any ibuprofen and states that the vitamin D, other medications discussed over-the-counter previously, home exercises had been keeping this pain a day. Patient did respond to [place] about a manipulation previously. Denies any radiation to the legs any numbness or tingling. States that he did sleep well last night. Denies any fevers chills or any abnormal weight loss.     Past medical history, social, surgical and family history all reviewed in electronic medical record.   Review of Systems: No headache, visual changes, nausea, vomiting, diarrhea, constipation, dizziness, abdominal pain, skin rash, fevers, chills, night sweats, weight loss, swollen lymph nodes, body aches, joint swelling, muscle aches, chest pain, shortness of breath, mood changes.   Objective Blood pressure 122/82, pulse 66, height 5' 9"  (1.753 m), weight 229 lb (103.874 kg), SpO2 96.00%.  General: No apparent distress alert and oriented x3 mood and affect normal, dressed appropriately. Poor core strength HEENT: Pupils equal, extraocular movements intact  Respiratory: Patient's speak in full sentences and does not appear short of breath  Cardiovascular: No lower extremity edema, non tender, no erythema  Skin: Warm dry intact with no signs of infection or rash on extremities or on axial skeleton.  Abdomen: Soft nontender  Neuro: Cranial nerves II  through XII are intact, neurovascularly intact in all extremities with 2+ DTRs and 2+ pulses.  Lymph: No lymphadenopathy of posterior or anterior cervical chain or axillae bilaterally.  Gait normal with good balance and coordination.  MSK:  Non tender with full range of motion and good stability and symmetric strength and tone of shoulders, elbows, wrist, hip, knee and ankles bilaterally.  Back Exam:  Inspection: Mild increase in lordosis Motion: Flexion 35 deg, Extension 35 deg, Side Bending to 45 deg bilaterally,  Rotation to 45 deg bilaterally  SLR laying: Negative  XSLR laying: Negative  Palpable tenderness: Tender to palpation over the left SI joint. Mild tenderness to palpation over the right paraspinal musculature of the right lumbar spine. FABER: Positive left. Sensory change: Gross sensation intact to all lumbar and sacral dermatomes.  Reflexes: 2+ at both patellar tendons, 2+ at achilles tendons, Babinski's downgoing.  Strength at foot  Plantar-flexion: 5/5 Dorsi-flexion: 5/5 Eversion: 5/5 Inversion: 5/5  Leg strength  Quad: 5/5 Hamstring: 5/5 Hip flexor: 5/5 Hip abductors: 5/5  Gait unremarkable.  Osteopathic findings Sacrum left on left    Impression and Recommendations:     This case required medical decision making of moderate complexity.   Spent greater than 25 minutes with patient face-to-face and had greater than 50% of counseling including as described above in assessment and plan.

## 2013-12-18 ENCOUNTER — Telehealth: Payer: Self-pay | Admitting: Internal Medicine

## 2013-12-18 NOTE — Telephone Encounter (Signed)
As far as I know yes but I also think he should ask the blood specialist about this

## 2013-12-18 NOTE — Telephone Encounter (Signed)
Patient is calling to find out if he still needs to be taking his XARELTO 20 MG medication. Please advise.

## 2013-12-19 NOTE — Telephone Encounter (Signed)
LMOVM advising pt. Dr. Beryle Beams is no longer with [facility], my need a new referral place for [place]. Thanks

## 2013-12-19 NOTE — Telephone Encounter (Signed)
He is still with [facility] but in a different location

## 2013-12-20 ENCOUNTER — Telehealth: Payer: Self-pay | Admitting: *Deleted

## 2013-12-20 NOTE — Telephone Encounter (Signed)
Per POF I spoke with Susette Racer from Internal Medicine to contact Pt. For a follow up appt.

## 2013-12-29 ENCOUNTER — Encounter: Payer: Self-pay | Admitting: Family Medicine

## 2013-12-29 ENCOUNTER — Ambulatory Visit (INDEPENDENT_AMBULATORY_CARE_PROVIDER_SITE_OTHER)
Admission: RE | Admit: 2013-12-29 | Discharge: 2013-12-29 | Disposition: A | Discharge status: Home / Self Care | Payer: BC Managed Care – PPO | Source: Ambulatory Visit | Attending: Family Medicine | Admitting: Family Medicine

## 2013-12-29 ENCOUNTER — Ambulatory Visit (INDEPENDENT_AMBULATORY_CARE_PROVIDER_SITE_OTHER): Payer: BC Managed Care – PPO | Admitting: Family Medicine

## 2013-12-29 VITALS — BP 140/82 | HR 57 | Ht 69.0 in | Wt 227.0 lb

## 2013-12-29 DIAGNOSIS — M999 Biomechanical lesion, unspecified: Secondary | ICD-10-CM

## 2013-12-29 DIAGNOSIS — M5416 Radiculopathy, lumbar region: Secondary | ICD-10-CM

## 2013-12-29 DIAGNOSIS — IMO0002 Reserved for concepts with insufficient information to code with codable children: Secondary | ICD-10-CM

## 2013-12-29 DIAGNOSIS — M533 Sacrococcygeal disorders, not elsewhere classified: Secondary | ICD-10-CM

## 2013-12-29 MED ORDER — CYCLOBENZAPRINE HCL 10 MG PO TABS: 10.0000 mg | ORAL_TABLET | Freq: Every day | ORAL | Status: DC

## 2013-12-29 NOTE — Patient Instructions (Addendum)
Goo dot see you Try the topical medicine Pennsaid 2 times daily but if it hurts your stomach stop it.  Flexeril at night. May make you sleepy.  Lets get xrays today.  Come back in 3 weeks.

## 2013-12-29 NOTE — Assessment & Plan Note (Addendum)
Continues to get patient some problem. We'll try topical anti-inflammatories and had patient stop oral anti-inflammatories with him being on blood thinner. We also discussed icing regimen. Patient was given home exercise program and we'll focus on one of the piriformis muscle as well. Patient try these interventions and come back and see me again in 3 weeks for further evaluation and treatment. X-rays ordered today to rule out any bony pathology

## 2013-12-29 NOTE — Assessment & Plan Note (Signed)
Decision today to treat with OMT was based on Physical Exam  After verbal consent patient was treated with HVLA, ME techniques in sacral areas  Patient tolerated the procedure well with improvement in symptoms  Patient given exercises, stretches and lifestyle modifications  See medications in patient instructions if given  Patient will follow up in 2-3 weeks

## 2013-12-29 NOTE — Progress Notes (Signed)
  [facility] [place], [place] [address] Phone: [phone] Subjective:     CC: Low back pain follow up.   TRR:[id number] Willie Foster is a 48 y.o. male coming in with complaint of low back pain.  Patient has been seen before and does have a sacroiliac joint dysfunction. Patient had been doing very well but the last 2 days started having increasing pain. Patient is traveling out of town and would like to be pain-free Willie Foster [birth year] birthday. Patient denies any radiation down the leg. Patient once again takes a muscle relaxer on an as-needed basis as well as the over-the-counter medications we suggested previously. Denies any numbness or weakness. Still able to do daily activities. This that he is having some mild increase in pain in the buttock area bilaterally.     Past medical history, social, surgical and family history all reviewed in electronic medical record.   Review of Systems: No headache, visual changes, nausea, vomiting, diarrhea, constipation, dizziness, abdominal pain, skin rash, fevers, chills, night sweats, weight loss, swollen lymph nodes, body aches, joint swelling, muscle aches, chest pain, shortness of breath, mood changes.   Objective Blood pressure 140/82, pulse 57, height 5' 9"  (1.753 m), weight 227 lb (102.967 kg), SpO2 96.00%.  General: No apparent distress alert and oriented x3 mood and affect normal, dressed appropriately. Poor core strength HEENT: Pupils equal, extraocular movements intact  Respiratory: Patient's speak in full sentences and does not appear short of breath  Cardiovascular: No lower extremity edema, non tender, no erythema  Skin: Warm dry intact with no signs of infection or rash on extremities or on axial skeleton.  Abdomen: Soft nontender  Neuro: Cranial nerves II through XII are intact, neurovascularly intact in all extremities with 2+ DTRs and 2+ pulses.  Lymph: No lymphadenopathy of posterior or  anterior cervical chain or axillae bilaterally.  Gait normal with good balance and coordination.  MSK:  Non tender with full range of motion and good stability and symmetric strength and tone of shoulders, elbows, wrist, hip, knee and ankles bilaterally.  Back Exam:  Inspection: Mild increase in lordosis Motion: Flexion 35 deg, Extension 35 deg, Side Bending to 45 deg bilaterally,  Rotation to 45 deg bilaterally  SLR laying: Negative  XSLR laying: Negative  Palpable tenderness: Tender to palpation over the left SI joint. Mild tenderness to palpation over the right paraspinal musculature of the right lumbar spine. FABER: Positive left. Sensory change: Gross sensation intact to all lumbar and sacral dermatomes.  Reflexes: 2+ at both patellar tendons, 2+ at achilles tendons, Babinski's downgoing.  Strength at foot  Plantar-flexion: 5/5 Dorsi-flexion: 5/5 Eversion: 5/5 Inversion: 5/5  Leg strength  Quad: 5/5 Hamstring: 5/5 Hip flexor: 5/5 Hip abductors: /5  Gait unremarkable.  Osteopathic findings  Lumbar L2 flexed rotated inside that right  Sacrum left on left    Impression and Recommendations:     This case required medical decision making of moderate complexity.   Spent greater than 25 minutes with patient face-to-face and had greater than 50% of counseling including as described above in assessment and plan.

## 2014-01-02 ENCOUNTER — Ambulatory Visit (INDEPENDENT_AMBULATORY_CARE_PROVIDER_SITE_OTHER): Payer: BC Managed Care – PPO | Admitting: Oncology

## 2014-01-02 ENCOUNTER — Encounter: Payer: Self-pay | Admitting: Oncology

## 2014-01-02 VITALS — BP 120/78 | HR 62 | Temp 97.2°F | Resp 20 | Ht 70.0 in | Wt 227.3 lb

## 2014-01-02 DIAGNOSIS — I2699 Other pulmonary embolism without acute cor pulmonale: Secondary | ICD-10-CM | POA: Diagnosis not present

## 2014-01-02 DIAGNOSIS — D72819 Decreased white blood cell count, unspecified: Secondary | ICD-10-CM

## 2014-01-02 LAB — COMPREHENSIVE METABOLIC PANEL
ALK PHOS: 60 U/L (ref 39–117)
ALT: 95 U/L — AB (ref 0–53)
AST: 49 U/L — AB (ref 0–37)
Albumin: 4.3 g/dL (ref 3.5–5.2)
BILIRUBIN TOTAL: 0.4 mg/dL (ref 0.2–1.2)
BUN: 21 mg/dL (ref 6–23)
CO2: 30 mEq/L (ref 19–32)
CREATININE: 1.17 mg/dL (ref 0.50–1.35)
Calcium: 9.5 mg/dL (ref 8.4–10.5)
Chloride: 100 mEq/L (ref 96–112)
Glucose, Bld: 122 mg/dL — ABNORMAL HIGH (ref 70–99)
Potassium: 3.3 mEq/L — ABNORMAL LOW (ref 3.5–5.3)
Sodium: 137 mEq/L (ref 135–145)
Total Protein: 7.1 g/dL (ref 6.0–8.3)

## 2014-01-02 LAB — CBC WITH DIFFERENTIAL/PLATELET
BASOS ABS: 0 10*3/uL (ref 0.0–0.1)
BASOS PCT: 0 % (ref 0–1)
EOS PCT: 3 % (ref 0–5)
Eosinophils Absolute: 0.1 10*3/uL (ref 0.0–0.7)
HCT: 42.3 % (ref 39.0–52.0)
Hemoglobin: 14.7 g/dL (ref 13.0–17.0)
Lymphocytes Relative: 39 % (ref 12–46)
Lymphs Abs: 1.4 10*3/uL (ref 0.7–4.0)
MCH: 26.5 pg (ref 26.0–34.0)
MCHC: 34.8 g/dL (ref 30.0–36.0)
MCV: 76.4 fL — AB (ref 78.0–100.0)
MONO ABS: 0.4 10*3/uL (ref 0.1–1.0)
Monocytes Relative: 10 % (ref 3–12)
Neutro Abs: 1.7 10*3/uL (ref 1.7–7.7)
Neutrophils Relative %: 48 % (ref 43–77)
Platelets: 170 10*3/uL (ref 150–400)
RBC: 5.54 MIL/uL (ref 4.22–5.81)
RDW: 15.4 % (ref 11.5–15.5)
WBC: 3.6 10*3/uL — AB (ref 4.0–10.5)

## 2014-01-02 NOTE — Progress Notes (Signed)
Patient ID: Willie Foster, male   DOB: [birth year], 48 y.o.   MRN: [id number] Hematology and Oncology Follow Up Visit  Willie Foster [id number] [birth year] 48 y.o. 01/02/2014 5:04 PM   Principle Diagnosis: Encounter Diagnoses  Name Primary?  . Pulmonary embolism Yes  . Leukopenia      Interim History:   Followup visit for this pleasant man who just turned 48 years old last week. 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 He had a number of a pulmonary embolism just over a year ago in June of 2014. He has been on Xarelto anticoagulation. As part of his workup in view of intermittent guaiac-positive stools, he had a colonoscopy on 05/03/2013 which was normal. He is asymptomatic at this time and denies any chest pain, dyspnea, palpitations. He has had no interim medical problems although he was evaluated for a lumbar radiculopathy last week. Regular x-rays of the spine were unremarkable. He never received the letter that was supposed to be sent to my patients from the cancer center when I changed offices but he searched me out and found me. He is still getting the same woman but hasn't been able to bring himself to pop the question.  Medications: reviewed  Allergies:  Allergies  Allergen Reactions  . Lipitor [Atorvastatin]     Elevated LFT's    Review of Systems: Hematology:  No bleeding or bruising ENT ROS: No sore throat Breast ROS:  Respiratory ROS: See above Cardiovascular ROS:  No ischemic type chest pain or palpitations Gastrointestinal ROS:   No abdominal pain or change in bowel habits. No hematochezia or melena Genito-Urinary ROS: No urinary tract symptoms Musculoskeletal ROS: See above Neurological ROS: No headache or change in vision Dermatological ROS: No rash Remaining ROS negative:   Physical Exam: Blood pressure 120/78, pulse 62, temperature 97.2 F (36.2 C), temperature source Oral, resp. rate 20, height 5' 10"  (1.778 m), weight 227 lb 4.8 oz (103.103  kg), SpO2 97.00%. Wt Readings from Last 3 Encounters:  01/02/14 227 lb 4.8 oz (103.103 kg)  12/29/13 227 lb (102.967 kg)  12/12/13 229 lb (103.874 kg)     General appearance: A muscular well-nourished African American man HENNT: Pharynx no erythema, exudate, mass, or ulcer. No thyromegaly or thyroid nodules Lymph nodes: No cervical, supraclavicular, or axillary lymphadenopathy Breasts:  Lungs: Clear to auscultation, resonant to percussion throughout Foster: Regular rhythm, no murmur, no gallop, no rub, no click, no edema Abdomen: Soft, nontender, normal bowel sounds, no mass, no organomegaly Extremities: No edema, no calf tenderness Musculoskeletal: no joint deformities GU:  Vascular: Carotid pulses 2+, no bruits, distal pulses: Dorsalis pedis 1+ symmetri Neurologic: Alert, oriented, PERRLA, optic discs sharp and vessels normal, no hemorrhage or exudate, cranial nerves grossly normal, motor strength 5 over 5, reflexes 1+ symmetric, upper body coordination normal, gait normal, Skin: No rash or ecchymosis  Lab Results: CBC W/Diff    Component Value Date/Time   WBC 4.2 05/12/2013 1536   WBC 4.9 11/18/2012 1214   RBC 5.37 05/12/2013 1536   RBC 5.45 11/18/2012 1214   RBC 5.50 11/18/2012 1214   HGB 13.3 05/12/2013 1536   HGB 14.5 11/18/2012 1214   HCT 41.9 05/12/2013 1536   HCT 43.0 11/18/2012 1214   PLT 190 05/12/2013 1536   PLT 305.0 11/18/2012 1214   MCV 78.2* 05/12/2013 1536   MCV 78.9 11/18/2012 1214   MCH 24.9* 05/12/2013 1536   MCH 26.1 11/15/2012 0500   MCHC 31.8* 05/12/2013 1536  MCHC 33.8 11/18/2012 1214   RDW 14.0 05/12/2013 1536   RDW 13.9 11/18/2012 1214   LYMPHSABS 1.8 05/12/2013 1536   LYMPHSABS 1.2 11/18/2012 1214   MONOABS 0.5 05/12/2013 1536   MONOABS 0.6 11/18/2012 1214   EOSABS 0.1 05/12/2013 1536   EOSABS 0.2 11/18/2012 1214   BASOSABS 0.0 05/12/2013 1536   BASOSABS 0.0 11/18/2012 1214     Chemistry      Component Value Date/Time   NA 141 01/18/2013 1158   NA  137 01/11/2013 1345   K 3.6 01/18/2013 1158   K 3.9 01/11/2013 1345   CL 99 01/11/2013 1345   CO2 25 01/18/2013 1158   CO2 29 01/11/2013 1345   BUN 12.5 01/18/2013 1158   BUN 13 01/11/2013 1345   CREATININE 1.0 01/18/2013 1158   CREATININE 1.1 01/11/2013 1345      Component Value Date/Time   CALCIUM 9.8 01/18/2013 1158   CALCIUM 9.7 01/11/2013 1345   ALKPHOS 63 01/18/2013 1158   ALKPHOS 71 11/18/2012 1214   AST 30 01/18/2013 1158   AST 29 11/18/2012 1214   ALT 45 01/18/2013 1158   ALT 45 11/18/2012 1214   BILITOT 0.47 01/18/2013 1158   BILITOT 0.2* 11/18/2012 1214       Radiological Studies: Dg Lumbar Spine Complete  12/29/2013   CLINICAL DATA:  Lower back pain for 4-5 months, no known injury.  EXAM: LUMBAR SPINE - COMPLETE 4+ VIEW  COMPARISON:  None.  FINDINGS: Gentle leftward curvature of the lumbar spine. No displaced acute fracture or dislocation. Maintained intervertebral disc spaces. No overt degenerative change. Mild atherosclerotic vascular calcification.  IMPRESSION: No acute or aggressive osseous finding of the lumbar spine.   Electronically Signed   By: Carlos Levering M.D.   On: 12/29/2013 13:13    Impression:  #1. Unprovoked pulmonary embolus at a young age with no other obvious risk factors. We had another lengthy discussion about clinical trial results and extension of anticoagulation in otherwise healthy people with a low risk for bleeding. Given the idiopathic nature of his blood clot at a young age and the potential life-threatening location of the clot, I am recommending ongoing anticoagulation with periodic reevaluation. I will see him again in one year for any updates in the field but for now he is advised to continue full dose Xarelto.  There are some data with apixiban from a single clinical trial looking at full dose versus prophylactic dose versus no additional anticoagulation which showed very promising reduction in rethrombosis at a prophylactic dose, equivalent to the  therapeutic dose, without any increase in bleeding complications. This may be the way of the future. However we need more data to substantiate this approach.   CC: Patient Care Team: [name], MD as PCP - General (Internal Medicine)   Annia Belt, MD 8/11/20155:04 PM

## 2014-01-02 NOTE — Patient Instructions (Signed)
To lab today Return visit in 1 year

## 2014-01-10 ENCOUNTER — Other Ambulatory Visit: Payer: Self-pay | Admitting: Internal Medicine

## 2014-01-17 ENCOUNTER — Telehealth: Payer: Self-pay | Admitting: Internal Medicine

## 2014-01-17 ENCOUNTER — Ambulatory Visit: Payer: BC Managed Care – PPO | Admitting: Family Medicine

## 2014-01-17 NOTE — Telephone Encounter (Signed)
Pt called and cancelled his appt for today at 11:15.  He stated that as he was leaving to come to the appointment, he noticed his tire was flat. Pt is r/s to 01/19/14.

## 2014-01-19 ENCOUNTER — Ambulatory Visit (INDEPENDENT_AMBULATORY_CARE_PROVIDER_SITE_OTHER): Payer: BC Managed Care – PPO | Admitting: Family Medicine

## 2014-01-19 ENCOUNTER — Encounter: Payer: Self-pay | Admitting: Family Medicine

## 2014-01-19 VITALS — BP 110/80 | HR 72 | Ht 70.0 in | Wt 224.0 lb

## 2014-01-19 DIAGNOSIS — M533 Sacrococcygeal disorders, not elsewhere classified: Secondary | ICD-10-CM

## 2014-01-19 DIAGNOSIS — M999 Biomechanical lesion, unspecified: Secondary | ICD-10-CM | POA: Insufficient documentation

## 2014-01-19 NOTE — Assessment & Plan Note (Signed)
Decision today to treat with OMT was based on Physical Exam  After verbal consent patient was treated with HVLA, ME techniques in thoracic, lumbar and sacral areas  Patient tolerated the procedure well with improvement in symptoms  Patient given exercises, stretches and lifestyle modifications  See medications in patient instructions if given  Patient will follow up in 2-3 weeks

## 2014-01-19 NOTE — Progress Notes (Signed)
  [name] [place], [place] [address] Phone: [phone] Subjective:     CC: Low back pain follow up.   QDI:[id number] Willie Foster is a 48 y.o. male coming in with complaint of low back pain.  Patient has been seen before and does have a sacroiliac joint dysfunction. Patient was concern more for her lumbar radiculopathy at last exam. We did get x-rays. X-ray showed that patient's did not have any bony abnormalities. Patient states since then he has been continuing some of the conservative therapy. Patient states overall he is having more good days than bad days. Patient states that he has not been doing any exercises. Patient has been icing somewhat. Patient states that at the end of the month every month and seems to be his busy season. Patient denies any radicular symptoms at this time.   Patient did have x-rays at last exam. X-rays of the lumbar spine were unremarkable. These are once again reviewed by me     Past medical history, social, surgical and family history all reviewed in electronic medical record.   Review of Systems: No headache, visual changes, nausea, vomiting, diarrhea, constipation, dizziness, abdominal pain, skin rash, fevers, chills, night sweats, weight loss, swollen lymph nodes, body aches, joint swelling, muscle aches, chest pain, shortness of breath, mood changes.   Objective Blood pressure 110/80, pulse 72, height 5' 10"  (1.778 m), weight 224 lb (101.606 kg), SpO2 97.00%.  General: No apparent distress alert and oriented x3 mood and affect normal, dressed appropriately. Poor core strength HEENT: Pupils equal, extraocular movements intact  Respiratory: Patient's speak in full sentences and does not appear short of breath  Cardiovascular: No lower extremity edema, non tender, no erythema  Skin: Warm dry intact with no signs of infection or rash on extremities or on axial skeleton.  Abdomen: Soft nontender  Neuro:  Cranial nerves II through XII are intact, neurovascularly intact in all extremities with 2+ DTRs and 2+ pulses.  Lymph: No lymphadenopathy of posterior or anterior cervical chain or axillae bilaterally.  Gait normal with good balance and coordination.  MSK:  Non tender with full range of motion and good stability and symmetric strength and tone of shoulders, elbows, wrist, hip, knee and ankles bilaterally.  Back Exam:  Inspection: Mild increase in lordosis Motion: Flexion 35 deg, Extension 35 deg, Side Bending to 45 deg bilaterally,  Rotation to 45 deg bilaterally  SLR laying: Negative  XSLR laying: Negative  Palpable tenderness: Tender to palpation over the left SI joint. FABER: Positive left. Sensory change: Gross sensation intact to all lumbar and sacral dermatomes.  Reflexes: 2+ at both patellar tendons, 2+ at achilles tendons, Babinski's downgoing.  Strength at foot  Plantar-flexion: 5/5 Dorsi-flexion: 5/5 Eversion: 5/5 Inversion: 5/5  Leg strength  Quad: 5/5 Hamstring: 5/5 Hip flexor: 5/5 Hip abductors: /5  Gait unremarkable.  Osteopathic findings  Thoracic T5 extended rotated and side bent right  Lumbar L2 flexed rotated inside that right  Sacrum left on left    Impression and Recommendations:     This case required medical decision making of moderate complexity.

## 2014-01-19 NOTE — Assessment & Plan Note (Addendum)
Patient is doing fairly well overall. We discussed different treatment options including topical anti-inflammatories but would patient have a history of pulmonary embolism and then a limited is for long-term. We discussed icing protocol as well as different home exercises and strengthening exercises I could be helpful. Patient showed proper technique. Patient and will come back again in 4 weeks for further evaluation and treatment.  Spent greater than 25 minutes with patient face-to-face and had greater than 50% of counseling including as described above in assessment and plan.

## 2014-01-19 NOTE — Patient Instructions (Signed)
Good to see you Sacroiliac Joint Mobilization and Rehab 1. Work on pretzel stretching, shoulder back and leg draped in front. 3-5 sets, 30 sec.. 2. hip abductor rotations. standing, hip flexion and rotation outward then inward. 3 sets, 15 reps. when can do comfortably, add ankle weights starting at 2 pounds.  3. cross over stretching - shoulder back to ground, same side leg crossover. 3-5 sets for 30 min..  4. rolling up and back knees to chest and rocking. 5. sacral tilt - 5 sets, hold for 5-10 seconds  The look at handout and do 3 times a week.   See me again in 4 weeks.

## 2014-01-31 ENCOUNTER — Encounter: Payer: Self-pay | Admitting: Internal Medicine

## 2014-01-31 ENCOUNTER — Ambulatory Visit (INDEPENDENT_AMBULATORY_CARE_PROVIDER_SITE_OTHER): Payer: BC Managed Care – PPO | Admitting: Internal Medicine

## 2014-01-31 ENCOUNTER — Other Ambulatory Visit (INDEPENDENT_AMBULATORY_CARE_PROVIDER_SITE_OTHER): Payer: BC Managed Care – PPO

## 2014-01-31 VITALS — BP 132/96 | HR 61 | Temp 98.3°F | Resp 16 | Ht 70.0 in | Wt 225.8 lb

## 2014-01-31 DIAGNOSIS — E876 Hypokalemia: Secondary | ICD-10-CM

## 2014-01-31 DIAGNOSIS — E669 Obesity, unspecified: Secondary | ICD-10-CM

## 2014-01-31 DIAGNOSIS — R740 Nonspecific elevation of levels of transaminase and lactic acid dehydrogenase [LDH]: Secondary | ICD-10-CM

## 2014-01-31 DIAGNOSIS — K760 Fatty (change of) liver, not elsewhere classified: Secondary | ICD-10-CM

## 2014-01-31 DIAGNOSIS — T502X5A Adverse effect of carbonic-anhydrase inhibitors, benzothiadiazides and other diuretics, initial encounter: Secondary | ICD-10-CM

## 2014-01-31 DIAGNOSIS — I10 Essential (primary) hypertension: Secondary | ICD-10-CM

## 2014-01-31 DIAGNOSIS — Z0000 Encounter for general adult medical examination without abnormal findings: Secondary | ICD-10-CM

## 2014-01-31 DIAGNOSIS — K7581 Nonalcoholic steatohepatitis (NASH): Secondary | ICD-10-CM | POA: Insufficient documentation

## 2014-01-31 DIAGNOSIS — R7401 Elevation of levels of liver transaminase levels: Secondary | ICD-10-CM

## 2014-01-31 DIAGNOSIS — M533 Sacrococcygeal disorders, not elsewhere classified: Secondary | ICD-10-CM

## 2014-01-31 DIAGNOSIS — R7309 Other abnormal glucose: Secondary | ICD-10-CM

## 2014-01-31 DIAGNOSIS — R7402 Elevation of levels of lactic acid dehydrogenase (LDH): Secondary | ICD-10-CM

## 2014-01-31 DIAGNOSIS — M999 Biomechanical lesion, unspecified: Secondary | ICD-10-CM

## 2014-01-31 LAB — TSH: TSH: 1.09 u[IU]/mL (ref 0.35–4.50)

## 2014-01-31 LAB — CBC WITH DIFFERENTIAL/PLATELET
BASOS PCT: 0.2 % (ref 0.0–3.0)
Basophils Absolute: 0 10*3/uL (ref 0.0–0.1)
EOS ABS: 0.1 10*3/uL (ref 0.0–0.7)
Eosinophils Relative: 1.9 % (ref 0.0–5.0)
HEMATOCRIT: 43.1 % (ref 39.0–52.0)
Hemoglobin: 14.6 g/dL (ref 13.0–17.0)
Lymphocytes Relative: 43.6 % (ref 12.0–46.0)
Lymphs Abs: 1.5 10*3/uL (ref 0.7–4.0)
MCHC: 33.8 g/dL (ref 30.0–36.0)
MCV: 78.3 fl (ref 78.0–100.0)
MONO ABS: 0.4 10*3/uL (ref 0.1–1.0)
Monocytes Relative: 11.6 % (ref 3.0–12.0)
Neutro Abs: 1.5 10*3/uL (ref 1.4–7.7)
Neutrophils Relative %: 42.7 % — ABNORMAL LOW (ref 43.0–77.0)
Platelets: 189 10*3/uL (ref 150.0–400.0)
RBC: 5.5 Mil/uL (ref 4.22–5.81)
RDW: 14 % (ref 11.5–15.5)
WBC: 3.5 10*3/uL — ABNORMAL LOW (ref 4.0–10.5)

## 2014-01-31 LAB — COMPREHENSIVE METABOLIC PANEL
ALK PHOS: 58 U/L (ref 39–117)
ALT: 142 U/L — ABNORMAL HIGH (ref 0–53)
AST: 105 U/L — ABNORMAL HIGH (ref 0–37)
Albumin: 4 g/dL (ref 3.5–5.2)
BUN: 14 mg/dL (ref 6–23)
CO2: 30 mEq/L (ref 19–32)
CREATININE: 1 mg/dL (ref 0.4–1.5)
Calcium: 9.6 mg/dL (ref 8.4–10.5)
Chloride: 100 mEq/L (ref 96–112)
GFR: 80.32 mL/min (ref >60.00)
Glucose, Bld: 79 mg/dL (ref 70–99)
Potassium: 3.7 mEq/L (ref 3.5–5.1)
Sodium: 138 mEq/L (ref 135–145)
Total Bilirubin: 0.7 mg/dL (ref 0.2–1.2)
Total Protein: 7.3 g/dL (ref 6.0–8.3)

## 2014-01-31 LAB — LIPID PANEL
CHOL/HDL RATIO: 6
Cholesterol: 285 mg/dL — ABNORMAL HIGH (ref 0–200)
HDL: 47.2 mg/dL (ref >39.00)
LDL CALC: 202 mg/dL — AB (ref 0–99)
NonHDL: 237.8
TRIGLYCERIDES: 179 mg/dL — AB (ref 0.0–149.0)
VLDL: 35.8 mg/dL (ref 0.0–40.0)

## 2014-01-31 LAB — HEMOGLOBIN A1C: HEMOGLOBIN A1C: 6.1 % (ref 4.6–6.5)

## 2014-01-31 LAB — FECAL OCCULT BLOOD, GUAIAC: Fecal Occult Blood: NEGATIVE

## 2014-01-31 LAB — PSA: PSA: 0.64 ng/mL (ref 0.10–4.00)

## 2014-01-31 NOTE — Progress Notes (Signed)
Subjective:    Patient ID: Willie Foster, male    DOB: Jan 22, 1966, 48 y.o.   MRN: [id number]  Hypertension This is a chronic problem. The current episode started more than 1 year ago. The problem is unchanged. The problem is uncontrolled. Pertinent negatives include no anxiety, blurred vision, chest pain, headaches, malaise/fatigue, neck pain, orthopnea, palpitations, peripheral edema, PND, shortness of breath or sweats. Agents associated with hypertension include NSAIDs. Past treatments include beta blockers and diuretics. The current treatment provides mild improvement. Compliance problems include exercise and diet.       Review of Systems  Constitutional: Negative.  Negative for fever, chills, malaise/fatigue, diaphoresis, activity change, appetite change, fatigue and unexpected weight change.  HENT: Negative.   Eyes: Negative.  Negative for blurred vision.  Respiratory: Negative.  Negative for apnea, cough, choking, chest tightness, shortness of breath, wheezing and stridor.   Cardiovascular: Negative.  Negative for chest pain, palpitations, orthopnea and PND.  Gastrointestinal: Negative.  Negative for nausea, vomiting, abdominal pain, diarrhea, constipation and blood in stool.  Endocrine: Negative.   Genitourinary: Negative.  Negative for dysuria, urgency, frequency, hematuria, flank pain, decreased urine volume, discharge, penile swelling, scrotal swelling, enuresis, difficulty urinating, genital sores, penile pain and testicular pain.  Musculoskeletal: Positive for back pain. Negative for arthralgias, gait problem, joint swelling, myalgias, neck pain and neck stiffness.       He has persistent aching in his low back that worsens with movement, there is no radiation and no N/W/T.  Skin: Negative.   Allergic/Immunologic: Negative.   Neurological: Negative.  Negative for dizziness, syncope, speech difficulty, weakness, light-headedness, numbness and headaches.  Hematological: Negative.   Negative for adenopathy. Does not bruise/bleed easily.  Psychiatric/Behavioral: Negative.        Objective:   Physical Exam  Vitals reviewed. Constitutional: He is oriented to person, place, and time. He appears well-developed and well-nourished. No distress.  HENT:  Head: Normocephalic and atraumatic.  Mouth/Throat: Oropharynx is clear and moist. No oropharyngeal exudate.  Eyes: Conjunctivae are normal. Right eye exhibits no discharge. Left eye exhibits no discharge. No scleral icterus.  Neck: Normal range of motion. Neck supple. No JVD present. No tracheal deviation present. No thyromegaly present.  Cardiovascular: Normal rate, regular rhythm, normal heart sounds and intact distal pulses.  Exam reveals no gallop and no friction rub.   No murmur heard. Pulmonary/Chest: Effort normal and breath sounds normal. No stridor. No respiratory distress. He has no wheezes. He has no rales. He exhibits no tenderness.  Abdominal: Soft. Bowel sounds are normal. He exhibits no distension and no mass. There is no tenderness. There is no rebound and no guarding. Hernia confirmed negative in the right inguinal area and confirmed negative in the left inguinal area.  Genitourinary: Rectum normal, prostate normal, testes normal and penis normal. Rectal exam shows no external hemorrhoid, no internal hemorrhoid, no fissure, no mass, no tenderness and anal tone normal. Guaiac negative stool. Prostate is not enlarged and not tender. Right testis shows no mass, no swelling and no tenderness. Right testis is descended. Left testis shows no mass, no swelling and no tenderness. Left testis is descended. Circumcised. No phimosis, paraphimosis, hypospadias, penile erythema or penile tenderness. No discharge found.  Musculoskeletal: Normal range of motion. He exhibits no edema and no tenderness.  Lymphadenopathy:    He has no cervical adenopathy.       Right: No inguinal adenopathy present.       Left: No inguinal  adenopathy present.  Neurological:  He is alert and oriented to person, place, and time. He has normal strength. He displays no atrophy, no tremor and normal reflexes. No cranial nerve deficit or sensory deficit. He exhibits normal muscle tone. He displays a negative Romberg sign. He displays no seizure activity. Coordination and gait normal.  Reflex Scores:      Tricep reflexes are 1+ on the right side and 1+ on the left side.      Bicep reflexes are 1+ on the right side and 1+ on the left side.      Brachioradialis reflexes are 1+ on the right side and 1+ on the left side.      Patellar reflexes are 1+ on the right side and 1+ on the left side.      Achilles reflexes are 1+ on the right side and 1+ on the left side. Neg SLR in BLE  Skin: Skin is warm and dry. No rash noted. He is not diaphoretic. No erythema. No pallor.     Lab Results  Component Value Date   WBC 3.6* 01/02/2014   HGB 14.7 01/02/2014   HCT 42.3 01/02/2014   PLT 170 01/02/2014   GLUCOSE 122* 01/02/2014   CHOL 163 09/15/2012   TRIG 188.0* 09/15/2012   HDL 46.10 09/15/2012   LDLCALC 79 09/15/2012   ALT 95* 01/02/2014   AST 49* 01/02/2014   NA 137 01/02/2014   K 3.3* 01/02/2014   CL 100 01/02/2014   CREATININE 1.17 01/02/2014   BUN 21 01/02/2014   CO2 30 01/02/2014   TSH 0.80 09/15/2012   PSA 0.68 09/15/2012   INR 1.00 11/13/2012   HGBA1C 5.6 01/11/2013        Assessment & Plan:

## 2014-01-31 NOTE — Patient Instructions (Signed)
Health Maintenance A healthy lifestyle and preventative care can promote health and wellness.  Maintain regular health, dental, and eye exams.  Eat a healthy diet. Foods like vegetables, fruits, whole grains, low-fat dairy products, and lean protein foods contain the nutrients you need and are low in calories. Decrease your intake of foods high in solid fats, added sugars, and salt. Get information about a proper diet from your health care provider, if necessary.  Regular physical exercise is one of the most important things you can do for your health. Most adults should get at least 150 minutes of moderate-intensity exercise (any activity that increases your heart rate and causes you to sweat) each week. In addition, most adults need muscle-strengthening exercises on 2 or more days a week.   Maintain a healthy weight. The body mass index (BMI) is a screening tool to identify possible weight problems. It provides an estimate of body fat based on height and weight. Your health care provider can find your BMI and can help you achieve or maintain a healthy weight. For males 20 years and older:  A BMI below 18.5 is considered underweight.  A BMI of 18.5 to 24.9 is normal.  A BMI of 25 to 29.9 is considered overweight.  A BMI of 30 and above is considered obese.  Maintain normal blood lipids and cholesterol by exercising and minimizing your intake of saturated fat. Eat a balanced diet with plenty of fruits and vegetables. Blood tests for lipids and cholesterol should begin at age 20 and be repeated every 5 years. If your lipid or cholesterol levels are high, you are over age 50, or you are at high risk for heart disease, you may need your cholesterol levels checked more frequently.Ongoing high lipid and cholesterol levels should be treated with medicines if diet and exercise are not working.  If you smoke, find out from your health care provider how to quit. If you do not use tobacco, do not  start.  Lung cancer screening is recommended for adults aged 55-80 years who are at high risk for developing lung cancer because of a history of smoking. A yearly low-dose CT scan of the lungs is recommended for people who have at least a 30-pack-year history of smoking and are current smokers or have quit within the past 15 years. A pack year of smoking is smoking an average of 1 pack of cigarettes a day for 1 year (for example, a 30-pack-year history of smoking could mean smoking 1 pack a day for 30 years or 2 packs a day for 15 years). Yearly screening should continue until the smoker has stopped smoking for at least 15 years. Yearly screening should be stopped for people who develop a health problem that would prevent them from having lung cancer treatment.  If you choose to drink alcohol, do not have more than 2 drinks per day. One drink is considered to be 12 oz (360 mL) of beer, 5 oz (150 mL) of wine, or 1.5 oz (45 mL) of liquor.  Avoid the use of street drugs. Do not share needles with anyone. Ask for help if you need support or instructions about stopping the use of drugs.  High blood pressure causes heart disease and increases the risk of stroke. Blood pressure should be checked at least every 1-2 years. Ongoing high blood pressure should be treated with medicines if weight loss and exercise are not effective.  If you are 45-79 years old, ask your health care provider if   you should take aspirin to prevent heart disease.  Diabetes screening involves taking a blood sample to check your fasting blood sugar level. This should be done once every 3 years after age 45 if you are at a normal weight and without risk factors for diabetes. Testing should be considered at a younger age or be carried out more frequently if you are overweight and have at least 1 risk factor for diabetes.  Colorectal cancer can be detected and often prevented. Most routine colorectal cancer screening begins at the age of 50  and continues through age 75. However, your health care provider may recommend screening at an earlier age if you have risk factors for colon cancer. On a yearly basis, your health care provider may provide home test kits to check for hidden blood in the stool. A small camera at the end of a tube may be used to directly examine the colon (sigmoidoscopy or colonoscopy) to detect the earliest forms of colorectal cancer. Talk to your health care provider about this at age 50 when routine screening begins. A direct exam of the colon should be repeated every 5-10 years through age 75, unless early forms of precancerous polyps or small growths are found.  People who are at an increased risk for hepatitis B should be screened for this virus. You are considered at high risk for hepatitis B if:  You were born in a country where hepatitis B occurs often. Talk with your health care provider about which countries are considered high risk.  Your parents were born in a high-risk country and you have not received a shot to protect against hepatitis B (hepatitis B vaccine).  You have HIV or AIDS.  You use needles to inject street drugs.  You live with, or have sex with, someone who has hepatitis B.  You are a man who has sex with other men (MSM).  You get hemodialysis treatment.  You take certain medicines for conditions like cancer, organ transplantation, and autoimmune conditions.  Hepatitis C blood testing is recommended for all people born from 1945 through 1965 and any individual with known risk factors for hepatitis C.  Healthy men should no longer receive prostate-specific antigen (PSA) blood tests as part of routine cancer screening. Talk to your health care provider about prostate cancer screening.  Testicular cancer screening is not recommended for adolescents or adult males who have no symptoms. Screening includes self-exam, a health care provider exam, and other screening tests. Consult with your  health care provider about any symptoms you have or any concerns you have about testicular cancer.  Practice safe sex. Use condoms and avoid high-risk sexual practices to reduce the spread of sexually transmitted infections (STIs).  You should be screened for STIs, including gonorrhea and chlamydia if:  You are sexually active and are younger than 24 years.  You are older than 24 years, and your health care provider tells you that you are at risk for this type of infection.  Your sexual activity has changed since you were last screened, and you are at an increased risk for chlamydia or gonorrhea. Ask your health care provider if you are at risk.  If you are at risk of being infected with HIV, it is recommended that you take a prescription medicine daily to prevent HIV infection. This is called pre-exposure prophylaxis (PrEP). You are considered at risk if:  You are a man who has sex with other men (MSM).  You are a heterosexual man who   is sexually active with multiple partners.  You take drugs by injection.  You are sexually active with a partner who has HIV.  Talk with your health care provider about whether you are at high risk of being infected with HIV. If you choose to begin PrEP, you should first be tested for HIV. You should then be tested every 3 months for as long as you are taking PrEP.  Use sunscreen. Apply sunscreen liberally and repeatedly throughout the day. You should seek shade when your shadow is shorter than you. Protect yourself by wearing long sleeves, pants, a wide-brimmed hat, and sunglasses year round whenever you are outdoors.  Tell your health care provider of new moles or changes in moles, especially if there is a change in shape or color. Also, tell your health care provider if a mole is larger than the size of a pencil eraser.  A one-time screening for abdominal aortic aneurysm (AAA) and surgical repair of large AAAs by ultrasound is recommended for men aged  65-75 years who are current or former smokers.  Stay current with your vaccines (immunizations). Document Released: 11/07/2007 Document Revised: 05/16/2013 Document Reviewed: 10/06/2010 Exit[employer] Patient Information 2015 [employer]. This information is not intended to replace advice given to you by your health care provider. Make sure you discuss any questions you have with your health care provider.  Hypertension Hypertension, commonly called high blood pressure, is when the force of blood pumping through your arteries is too strong. Your arteries are the blood vessels that carry blood from your heart throughout your body. A blood pressure reading consists of a higher number over a lower number, such as 110/72. The higher number (systolic) is the pressure inside your arteries when your heart pumps. The lower number (diastolic) is the pressure inside your arteries when your heart relaxes. Ideally you want your blood pressure below 120/80. Hypertension forces your heart to work harder to pump blood. Your arteries may become narrow or stiff. Having hypertension puts you at risk for heart disease, stroke, and other problems.  RISK FACTORS Some risk factors for high blood pressure are controllable. Others are not.  Risk factors you cannot control include:   Race. You may be at higher risk if you are African American.  Age. Risk increases with age.  Gender. Men are at higher risk than women before age 45 years. After age 65, women are at higher risk than men. Risk factors you can control include:  Not getting enough exercise or physical activity.  Being overweight.  Getting too much fat, sugar, calories, or salt in your diet.  Drinking too much alcohol. SIGNS AND SYMPTOMS Hypertension does not usually cause signs or symptoms. Extremely high blood pressure (hypertensive crisis) may cause headache, anxiety, shortness of breath, and nosebleed. DIAGNOSIS  To check if you have hypertension,  your health care provider will measure your blood pressure while you are seated, with your arm held at the level of your heart. It should be measured at least twice using the same arm. Certain conditions can cause a difference in blood pressure between your right and left arms. A blood pressure reading that is higher than normal on one occasion does not mean that you need treatment. If one blood pressure reading is high, ask your health care provider about having it checked again. TREATMENT  Treating high blood pressure includes making lifestyle changes and possibly taking medicine. Living a healthy lifestyle can help lower high blood pressure. You may need to change   some of your habits. Lifestyle changes may include:  Following the DASH diet. This diet is high in fruits, vegetables, and whole grains. It is low in salt, red meat, and added sugars.  Getting at least 2 hours of brisk physical activity every week.  Losing weight if necessary.  Not smoking.  Limiting alcoholic beverages.  Learning ways to reduce stress. If lifestyle changes are not enough to get your blood pressure under control, your health care provider may prescribe medicine. You may need to take more than one. Work closely with your health care provider to understand the risks and benefits. HOME CARE INSTRUCTIONS  Have your blood pressure rechecked as directed by your health care provider.   Take medicines only as directed by your health care provider. Follow the directions carefully. Blood pressure medicines must be taken as prescribed. The medicine does not work as well when you skip doses. Skipping doses also puts you at risk for problems.   Do not smoke.   Monitor your blood pressure at home as directed by your health care provider. SEEK MEDICAL CARE IF:   You think you are having a reaction to medicines taken.  You have recurrent headaches or feel dizzy.  You have swelling in your ankles.  You have  trouble with your vision. SEEK IMMEDIATE MEDICAL CARE IF:  You develop a severe headache or confusion.  You have unusual weakness, numbness, or feel faint.  You have severe chest or abdominal pain.  You vomit repeatedly.  You have trouble breathing. MAKE SURE YOU:   Understand these instructions.  Will watch your condition.  Will get help right away if you are not doing well or get worse. Document Released: 05/11/2005 Document Revised: 09/25/2013 Document Reviewed: 03/03/2013 Exit[employer] Patient Information 2015 [employer]. This information is not intended to replace advice given to you by your health care provider. Make sure you discuss any questions you have with your health care provider.  

## 2014-01-31 NOTE — Progress Notes (Signed)
Pre visit review using our clinic review tool, if applicable. No additional management support is needed unless otherwise documented below in the visit note. 

## 2014-02-01 ENCOUNTER — Encounter: Payer: Self-pay | Admitting: Internal Medicine

## 2014-02-01 LAB — STD PANEL
HIV: NONREACTIVE
Hepatitis B Surface Ag: NEGATIVE

## 2014-02-01 LAB — HSV(HERPES SIMPLEX VRS) I + II AB-IGG
HSV 1 Glycoprotein G Ab, IgG: 7.73 IV — ABNORMAL HIGH
HSV 2 Glycoprotein G Ab, IgG: 7.64 IV — ABNORMAL HIGH

## 2014-02-01 LAB — GC/CHLAMYDIA PROBE AMP, URINE
CHLAMYDIA, SWAB/URINE, PCR: NEGATIVE
GC Probe Amp, Urine: NEGATIVE

## 2014-02-01 NOTE — Assessment & Plan Note (Signed)
He will work on his lifestyle modifications

## 2014-02-01 NOTE — Assessment & Plan Note (Signed)
I will recheck his K+ level and treat if needed

## 2014-02-01 NOTE — Assessment & Plan Note (Signed)
His BP is not well controlled He is not willing to add another agent Will stop nsaids He will work on his lifestyle modifications

## 2014-02-01 NOTE — Assessment & Plan Note (Signed)
PT evaluation He will have to stop nsaids for now

## 2014-02-01 NOTE — Assessment & Plan Note (Signed)
He asked for an STD panel today so that was ordered Exam done Vaccines were addressed Labs ordered Pt ed material was given

## 2014-02-01 NOTE — Assessment & Plan Note (Signed)
I have asked him to return for further evaluation Will check his for viral hepatitis and will an U/S done to see if there is a mass or fatty liver disease

## 2014-02-09 ENCOUNTER — Other Ambulatory Visit: Payer: BC Managed Care – PPO

## 2014-02-12 ENCOUNTER — Ambulatory Visit
Admission: RE | Admit: 2014-02-12 | Discharge: 2014-02-12 | Disposition: A | Discharge status: Home / Self Care | Payer: BC Managed Care – PPO | Source: Ambulatory Visit | Attending: Internal Medicine | Admitting: Internal Medicine

## 2014-02-12 ENCOUNTER — Encounter: Payer: Self-pay | Admitting: Internal Medicine

## 2014-02-12 DIAGNOSIS — R7401 Elevation of levels of liver transaminase levels: Secondary | ICD-10-CM

## 2014-02-12 DIAGNOSIS — R7402 Elevation of levels of lactic acid dehydrogenase (LDH): Secondary | ICD-10-CM

## 2014-02-12 DIAGNOSIS — R740 Nonspecific elevation of levels of transaminase and lactic acid dehydrogenase [LDH]: Principal | ICD-10-CM

## 2014-02-21 ENCOUNTER — Ambulatory Visit: Payer: BC Managed Care – PPO

## 2014-02-21 ENCOUNTER — Other Ambulatory Visit: Payer: Self-pay | Admitting: Internal Medicine

## 2014-02-28 ENCOUNTER — Ambulatory Visit: Payer: BC Managed Care – PPO

## 2014-03-27 ENCOUNTER — Ambulatory Visit (INDEPENDENT_AMBULATORY_CARE_PROVIDER_SITE_OTHER): Payer: BC Managed Care – PPO | Admitting: Family Medicine

## 2014-03-27 ENCOUNTER — Encounter: Payer: Self-pay | Admitting: Family Medicine

## 2014-03-27 VITALS — BP 108/76 | HR 61 | Ht 69.0 in | Wt 228.0 lb

## 2014-03-27 DIAGNOSIS — M999 Biomechanical lesion, unspecified: Secondary | ICD-10-CM

## 2014-03-27 DIAGNOSIS — M533 Sacrococcygeal disorders, not elsewhere classified: Secondary | ICD-10-CM

## 2014-03-27 DIAGNOSIS — M9904 Segmental and somatic dysfunction of sacral region: Secondary | ICD-10-CM

## 2014-03-27 NOTE — Progress Notes (Signed)
  [name] [address] [place], [place] [address] Phone: [phone] Subjective:     CC: Low back pain follow up.   [id number] Willie Foster is a 48 y.o. male coming in with complaint of low back pain.  Patient has been seen before and does have a sacroiliac joint dysfunction. Patient has had this pain intermittently but has been doing very well. Patient has returned to the gym and has been doing a lot of increased activity. Patient states with squatting though he did have a audible pop and had some mild discomfort starting yesterday on the side of his back. Patient took a muscle relaxer as well as did a topical medication and wakes up this morning was is a very small dull aching pain on the left side.no radiation down the leg. Seems to be consistent with his previous problems.has responded well to osteopathic manipulation in the past.     Past medical history, social, surgical and family history all reviewed in electronic medical record.   Review of Systems: No headache, visual changes, nausea, vomiting, diarrhea, constipation, dizziness, abdominal pain, skin rash, fevers, chills, night sweats, weight loss, swollen lymph nodes, body aches, joint swelling, muscle aches, chest pain, shortness of breath, mood changes.   Objective Blood pressure 108/76, pulse 61, height 5' 9"  (1.753 m), weight 228 lb (103.42 kg), SpO2 97 %.  General: No apparent distress alert and oriented x3 mood and affect normal, dressed appropriately. Poor core strength HEENT: Pupils equal, extraocular movements intact  Respiratory: Patient's speak in full sentences and does not appear short of breath  Cardiovascular: No lower extremity edema, non tender, no erythema  Skin: Warm dry intact with no signs of infection or rash on extremities or on axial skeleton.  Abdomen: Soft nontender  Neuro: Cranial nerves II through XII are intact, neurovascularly intact in all extremities with 2+  DTRs and 2+ pulses.  Lymph: No lymphadenopathy of posterior or anterior cervical chain or axillae bilaterally.  Gait normal with good balance and coordination.  MSK:  Non tender with full range of motion and good stability and symmetric strength and tone of shoulders, elbows, wrist, hip, knee and ankles bilaterally.  Back Exam:  Inspection: Mild increase in lordosis Motion: Flexion 35 deg, Extension 35 deg, Side Bending to 45 deg bilaterally,  Rotation to 45 deg bilaterally  SLR laying: Negative  XSLR laying: Negative  Palpable tenderness: Tender to palpation over the left SI joint. FABER: Positive left.no significant change from previous exam Sensory change: Gross sensation intact to all lumbar and sacral dermatomes.  Reflexes: 2+ at both patellar tendons, 2+ at achilles tendons, Babinski's downgoing.  Strength at foot  Plantar-flexion: 5/5 Dorsi-flexion: 5/5 Eversion: 5/5 Inversion: 5/5  Leg strength  Quad: 5/5 Hamstring: 5/5 Hip flexor: 5/5 Hip abductors:4 /5  Gait unremarkable.  Osteopathic findings  Thoracic T5 extended rotated and side bent right  Lumbar L2 flexed rotated inside that right  Sacrum left on left    Impression and Recommendations:     This case required medical decision making of moderate complexity.

## 2014-03-27 NOTE — Patient Instructions (Signed)
Ibuprofen 2  times a day for 3 days Decrease weight on back and legs by 25% for next 2 cycles.  Continue your exercises See me again 3 weeks.

## 2014-03-27 NOTE — Assessment & Plan Note (Signed)
Patient is having a mild flare overall. Patient is on a anticoagulation and we have avoided anti-inflammatories for quite some time. Patient is taking over-the-counter medicines which suggested we also given with 3 day dose of ibuprofen but lower dose than usual with him being on the anti-inflammatory. Patient will watch for any signs of easily bruising. Discussed home exercises and showed proper technique again. We discussed transition weight and proper technique with him at the gym. Patient tried to make these changes and come back and see me again in 3-4 weeks for further evaluation.

## 2014-03-27 NOTE — Assessment & Plan Note (Signed)
Decision today to treat with OMT was based on Physical Exam  After verbal consent patient was treated with HVLA, ME techniques in sacral areas  Patient tolerated the procedure well with improvement in symptoms  Patient given exercises, stretches and lifestyle modifications  See medications in patient instructions if given  Patient will follow up in 4 weeks

## 2014-04-16 ENCOUNTER — Other Ambulatory Visit: Payer: Self-pay | Admitting: Internal Medicine

## 2014-04-22 ENCOUNTER — Other Ambulatory Visit: Payer: Self-pay | Admitting: Internal Medicine

## 2014-04-27 ENCOUNTER — Telehealth: Payer: Self-pay | Admitting: Internal Medicine

## 2014-04-27 NOTE — Telephone Encounter (Signed)
Rx was sent in to pharmacy 11/29, pharmacy confirmed that rx is ready for pick-up. Left message for pt.

## 2014-04-27 NOTE — Telephone Encounter (Signed)
Pt has been out of his medication for a week, XARELTO 20 MG TABS tablet.

## 2014-05-15 ENCOUNTER — Ambulatory Visit (INDEPENDENT_AMBULATORY_CARE_PROVIDER_SITE_OTHER): Payer: 59 | Admitting: Internal Medicine

## 2014-05-15 ENCOUNTER — Encounter: Payer: Self-pay | Admitting: Internal Medicine

## 2014-05-15 VITALS — BP 130/80 | HR 65 | Temp 98.2°F | Wt 229.5 lb

## 2014-05-15 DIAGNOSIS — T148XXA Other injury of unspecified body region, initial encounter: Secondary | ICD-10-CM

## 2014-05-15 DIAGNOSIS — T148 Other injury of unspecified body region: Secondary | ICD-10-CM

## 2014-05-15 DIAGNOSIS — T1490 Injury, unspecified: Secondary | ICD-10-CM

## 2014-05-15 DIAGNOSIS — IMO0002 Reserved for concepts with insufficient information to code with codable children: Secondary | ICD-10-CM

## 2014-05-15 NOTE — Patient Instructions (Addendum)
Perform the exercises for wristw pain  twice a day as discussed. Use an anti-inflammatory cream such as Aspercreme or Zostrix cream twice a day to the affected area  after the exercises. In lieu of this warm moist compresses or  hot water bottle can be used. Do not apply ice . Consider glucosamine sulfate 1500 mg daily for wrist symptoms. Take this daily  for up to 4 weeks. This will rehydrate the soft tissues & tendons

## 2014-05-15 NOTE — Progress Notes (Signed)
   Subjective:    Patient ID: Willie Foster, male    DOB: 25-Apr-1966, 48 y.o.   MRN: [id number]  HPI  He has been doing [employer]Fit and has sustained some injuries  On 05/03/14 he injured his right wrist. He's had residual dull pain up to level I-II at the ulnar head. With flexion of the wrist the pain extends into the dorsum of the hand.  4 weeks ago he was doing pushups when he felt and heard a pop at the right anterior shoulder area. There was soreness which resolved but there's been residual discoloration of the skin in this area. The discoloration does appear to be resolving.    Review of Systems   He denies any numbness, tingling, weakness in the upper extremity.  He's had full range of motion of the right upper extremity and shoulder.     Objective:   Physical Exam Pertinent or positive findings include: There is a 4 x 5.5 cm irregular hyperpigmented area over the right anterior shoulder suggesting resolving subcutaneous hematoma. There is full range of motion without pain or discomfort. There is slight tenderness of the ulnar head and proximal dorsum of the right to palpation with hand flexed. Strength, tone are within normal limits. Deep tendon reflexes are slightly reduced but equal.  General appearance :well muscled;adequately nourished; in no distress. Eyes: No conjunctival inflammation or scleral icterus is present. Heart:  Normal rate and regular rhythm. S1 and S2 normal without gallop, murmur, click, rub or other extra sounds   Lungs:Chest clear to auscultation; no wheezes, rhonchi,rales ,or rubs present.No increased work of breathing.  Vascular : all pulses equal ; no bruits present. Skin:Warm & dry.  Intact without suspicious lesions or rashes ; no  tenting Lymphatic: No lymphadenopathy is noted about the head, neck, axilla, or epitrochlear areas.           Assessment & Plan:  #1 tendonous injury right radial wrist area  #2 resolving hematoma right anterior  shoulder  Plan: See after visit summary

## 2014-05-15 NOTE — Progress Notes (Signed)
Pre visit review using our clinic review tool, if applicable. No additional management support is needed unless otherwise documented below in the visit note. 

## 2014-05-29 ENCOUNTER — Encounter: Payer: Self-pay | Admitting: Family Medicine

## 2014-05-29 ENCOUNTER — Ambulatory Visit (INDEPENDENT_AMBULATORY_CARE_PROVIDER_SITE_OTHER): Payer: 59 | Admitting: Family Medicine

## 2014-05-29 VITALS — BP 134/88 | HR 73 | Ht 69.0 in | Wt 232.0 lb

## 2014-05-29 DIAGNOSIS — M533 Sacrococcygeal disorders, not elsewhere classified: Secondary | ICD-10-CM

## 2014-05-29 DIAGNOSIS — M9902 Segmental and somatic dysfunction of thoracic region: Secondary | ICD-10-CM

## 2014-05-29 DIAGNOSIS — M999 Biomechanical lesion, unspecified: Secondary | ICD-10-CM

## 2014-05-29 DIAGNOSIS — M9903 Segmental and somatic dysfunction of lumbar region: Secondary | ICD-10-CM

## 2014-05-29 DIAGNOSIS — M9904 Segmental and somatic dysfunction of sacral region: Secondary | ICD-10-CM

## 2014-05-29 MED ORDER — CYCLOBENZAPRINE HCL 10 MG PO TABS: 10.0000 mg | ORAL_TABLET | Freq: Three times a day (TID) | ORAL | Status: DC | PRN

## 2014-05-29 NOTE — Patient Instructions (Addendum)
Happy New Year! Continue what you are doing.  Try the pennsaid up to 2 times daily Ibuprofen 678m 3 times a day for 3 days when having a flare Flexeril when you need it.  When you lift consider eating within 45 minutes, recommend whey protein isolate.  See me again in 8 weeks.

## 2014-05-29 NOTE — Assessment & Plan Note (Signed)
Patient responded very well to manipulation again today. Discussed icing regimen, home exercises, we discussed nutritional supplementations. Patient is doing remarkably well and at this point will follow-up in 8-10 week intervals. Refill of patient's muscle relaxer given. We discussed safely over-the-counter medications. Patient will follow-up with me again as stated above.  Spent greater than 25 minutes with patient face-to-face and had greater than 50% of counseling including as described above in assessment and plan.

## 2014-05-29 NOTE — Assessment & Plan Note (Signed)
Decision today to treat with OMT was based on Physical Exam  After verbal consent patient was treated with HVLA, ME techniques in sacral areas  Patient tolerated the procedure well with improvement in symptoms  Patient given exercises, stretches and lifestyle modifications  See medications in patient instructions if given  Patient will follow up in 8-10 weeks

## 2014-05-29 NOTE — Progress Notes (Signed)
  [name] [address], [place] [address] Phone: [phone] Subjective:     CC: Low back pain follow up.   CWC:[id number] Willie Foster is a 49 y.o. male coming in with complaint of low back pain.  Patient has been seen  Fairly regularly. Patient has been working out on regular basis and has felt good. Patient states he is had to exacerbation since last time he was seen. Patient states he is starting to have some mild discomfort. States that it does respond somewhat to ibuprofen. Patient continues on anticoagulation though. Patient states that it is not stopping him from any activity but does not feel right. Patient is having no radiation or any weakness in the lower extremity.     Past medical history, social, surgical and family history all reviewed in electronic medical record.   Review of Systems: No headache, visual changes, nausea, vomiting, diarrhea, constipation, dizziness, abdominal pain, skin rash, fevers, chills, night sweats, weight loss, swollen lymph nodes, body aches, joint swelling, muscle aches, chest pain, shortness of breath, mood changes.   Objective Blood pressure 134/88, pulse 73, height 5' 9"  (1.753 m), weight 232 lb (105.235 kg), SpO2 98 %.  General: No apparent distress alert and oriented x3 mood and affect normal, dressed appropriately. Poor core strength HEENT: Pupils equal, extraocular movements intact  Respiratory: Patient's speak in full sentences and does not appear short of breath  Cardiovascular: No lower extremity edema, non tender, no erythema  Skin: Warm dry intact with no signs of infection or rash on extremities or on axial skeleton.  Abdomen: Soft nontender  Neuro: Cranial nerves II through XII are intact, neurovascularly intact in all extremities with 2+ DTRs and 2+ pulses.  Lymph: No lymphadenopathy of posterior or anterior cervical chain or axillae bilaterally.  Gait normal with good balance and  coordination.  MSK:  Non tender with full range of motion and good stability and symmetric strength and tone of shoulders, elbows, wrist, hip, knee and ankles bilaterally.  Back Exam:  Inspection: Mild increase in lordosis Motion: Flexion 35 deg, Extension 35 deg, Side Bending to 45 deg bilaterally,  Rotation to 45 deg bilaterally  SLR laying: Negative  XSLR laying: Negative  Palpable tenderness: Tender to palpation over the left SI joint , more than previous exam  FABER: Positive left. Sensory change: Gross sensation intact to all lumbar and sacral dermatomes.  Reflexes: 2+ at both patellar tendons, 2+ at achilles tendons, Babinski's downgoing.  Strength at foot  Plantar-flexion: 5/5 Dorsi-flexion: 5/5 Eversion: 5/5 Inversion: 5/5  Leg strength  Quad: 5/5 Hamstring: 5/5 Hip flexor: 5/5 Hip abductors:4 /5  Gait unremarkable.  Osteopathic findings  Thoracic T5 extended rotated and side bent right  Lumbar L2 flexed rotated inside that right  Sacrum left on left    Impression and Recommendations:     This case required medical decision making of moderate complexity.

## 2014-06-18 ENCOUNTER — Emergency Department (INDEPENDENT_AMBULATORY_CARE_PROVIDER_SITE_OTHER): Payer: 59

## 2014-06-18 ENCOUNTER — Emergency Department (INDEPENDENT_AMBULATORY_CARE_PROVIDER_SITE_OTHER)
Admission: EM | Admit: 2014-06-18 | Discharge: 2014-06-18 | Disposition: A | Discharge status: Home / Self Care | Payer: 59 | Source: Home / Self Care | Attending: Family Medicine | Admitting: Family Medicine

## 2014-06-18 ENCOUNTER — Encounter (HOSPITAL_COMMUNITY): Payer: Self-pay | Admitting: Emergency Medicine

## 2014-06-18 DIAGNOSIS — R0789 Other chest pain: Secondary | ICD-10-CM

## 2014-06-18 NOTE — ED Provider Notes (Signed)
Willie Foster is a 49 y.o. male who presents to Urgent Care today for left-sided chest pain started yesterday after visiting the gym. The pain is worse with deep inspiration. No shortness of breath palpitations. The pain is nonexertional and nonradiating. Patient has a history of pulmonary embolism and is currently taking Xarelto. No leg swelling.   Past Medical History  Diagnosis Date  . Hyperlipidemia   . Hypertension   . Fatty liver disease, nonalcoholic 6811  . Leukopenia 01/18/2013    WBC 3,400 01/18/13 was normal 8/22 Xarelto?  . Pulmonary embolism 10/2012    Right lung   History reviewed. No pertinent past surgical history. History  Substance Use Topics  . Smoking status: Never Smoker   . Smokeless tobacco: Never Used  . Alcohol Use: No     Comment: occasional-twice a week   ROS as above Medications: No current facility-administered medications for this encounter.   Current Outpatient Prescriptions  Medication Sig Dispense Refill  . atenolol-chlorthalidone (TENORETIC) 50-25 MG per tablet TAKE ONE TABLET BY MOUTH ONCE DAILY 90 tablet 1  . KLOR-CON M20 20 MEQ tablet TAKE ONE TABLET BY MOUTH TWICE DAILY 60 tablet 5  . XARELTO 20 MG TABS tablet TAKE ONE TABLET BY MOUTH ONCE DAILY WITH  SUPPER 90 tablet 1  . acyclovir (ZOVIRAX) 400 MG tablet Take 1 tablet (400 mg total) by mouth 3 (three) times daily as needed (fever blisters). 30 tablet 3  . Cetirizine HCl (ZYRTEC ALLERGY) 10 MG CAPS Take 10 mg by mouth daily.    . Cholecalciferol 2000 UNITS CAPS Take 1 capsule (2,000 Units total) by mouth daily. 30 each   . cyclobenzaprine (FLEXERIL) 10 MG tablet Take 1 tablet (10 mg total) by mouth 3 (three) times daily as needed for muscle spasms. 90 tablet 0  . OVER THE COUNTER MEDICATION Take by mouth 2 (two) times daily. L-Carnaitine/amino acid-109m   3 in am and one in pm    . Turmeric 500 MG CAPS Take 1 tablet by mouth 2 (two) times daily.     Allergies  Allergen Reactions  . Lipitor  [Atorvastatin]     Elevated LFT's     Exam:  BP 138/80 mmHg  Pulse 63  Temp(Src) 98.3 F (36.8 C) (Oral)  Resp 18  SpO2 98% Gen: Well NAD nontoxic appearing HEENT: EOMI,  MMM Lungs: Normal work of breathing. CTABL Heart: RRR no MRG Chest wall: Nontender Abd: NABS, Soft. Nondistended, Nontender Exts: Brisk capillary refill, warm and well perfused. No edema  Twelve-lead EKG: Sinus bradycardia at 54 bpm. No significant ST elevation or depression. Normal-appearing T waves. No large or significant Q waves.  No results found for this or any previous visit (from the past 24 hour(s)). Dg Chest 2 View  06/18/2014   CLINICAL DATA:  LEFT-sided chest pain since this morning.  EXAM: CHEST  2 VIEW  COMPARISON:  11/18/2012.  FINDINGS: Cardiopericardial silhouette within normal limits. Mediastinal contours normal. Trachea midline. No airspace disease or effusion.  IMPRESSION: No active cardiopulmonary disease.   Electronically Signed   By: GDereck LigasM.D.   On: 06/18/2014 20:33    Assessment and Plan: 49y.o. male with left-sided chest pain. Pain is nonexertional. Very unlikely PE based on continued Xarelto use and no leg swelling or tachycardia or shortness of breath. This is likely pleuritis versus chest wall pain. Plan to treat with Tylenol or ibuprofen and follow up with primary care provider. Patient may require stress test in the near future.  Discussed warning signs or symptoms. Please see discharge instructions. Patient expresses understanding.     Gregor Hams, MD 06/18/14 2040

## 2014-06-18 NOTE — Discharge Instructions (Signed)
Thank you for coming in today. Follow-up with your primary care provider Go to the emergency room if you get worse Take Tylenol as needed for pain Call or go to the emergency room if you get worse, have trouble breathing, have chest pains, or palpitations.    Chest Wall Pain Chest wall pain is pain in or around the bones and muscles of your chest. It may take up to 6 weeks to get better. It may take longer if you must stay physically active in your work and activities.  CAUSES  Chest wall pain may happen on its own. However, it may be caused by:  A viral illness like the flu.  Injury.  Coughing.  Exercise.  Arthritis.  Fibromyalgia.  Shingles. HOME CARE INSTRUCTIONS   Avoid overtiring physical activity. Try not to strain or perform activities that cause pain. This includes any activities using your chest or your abdominal and side muscles, especially if heavy weights are used.  Put ice on the sore area.  Put ice in a plastic bag.  Place a towel between your skin and the bag.  Leave the ice on for 15-20 minutes per hour while awake for the first 2 days.  Only take over-the-counter or prescription medicines for pain, discomfort, or fever as directed by your caregiver. SEEK IMMEDIATE MEDICAL CARE IF:   Your pain increases, or you are very uncomfortable.  You have a fever.  Your chest pain becomes worse.  You have new, unexplained symptoms.  You have nausea or vomiting.  You feel sweaty or lightheaded.  You have a cough with phlegm (sputum), or you cough up blood. MAKE SURE YOU:   Understand these instructions.  Will watch your condition.  Will get help right away if you are not doing well or get worse. Document Released: 05/11/2005 Document Revised: 08/03/2011 Document Reviewed: 01/05/2011 [facility] Patient Information 2015 [place], [place]. This information is not intended to replace advice given to you by your health care provider. Make sure you discuss any  questions you have with your health care provider.

## 2014-06-18 NOTE — ED Notes (Signed)
Pt states he gets mild discomfort in his left chest when he coughs, clears his throat, or blows his nose.  Pt has a history of blood clots and was concerned about this.

## 2014-07-15 IMAGING — CR DG CHEST 2V
3 series · 3 of 3 positions shown · non-contrast
Comparison: None

CLINICAL DATA: Right-sided chest pain

CHEST - 2 VIEW

[view not recorded (1 of 3)]
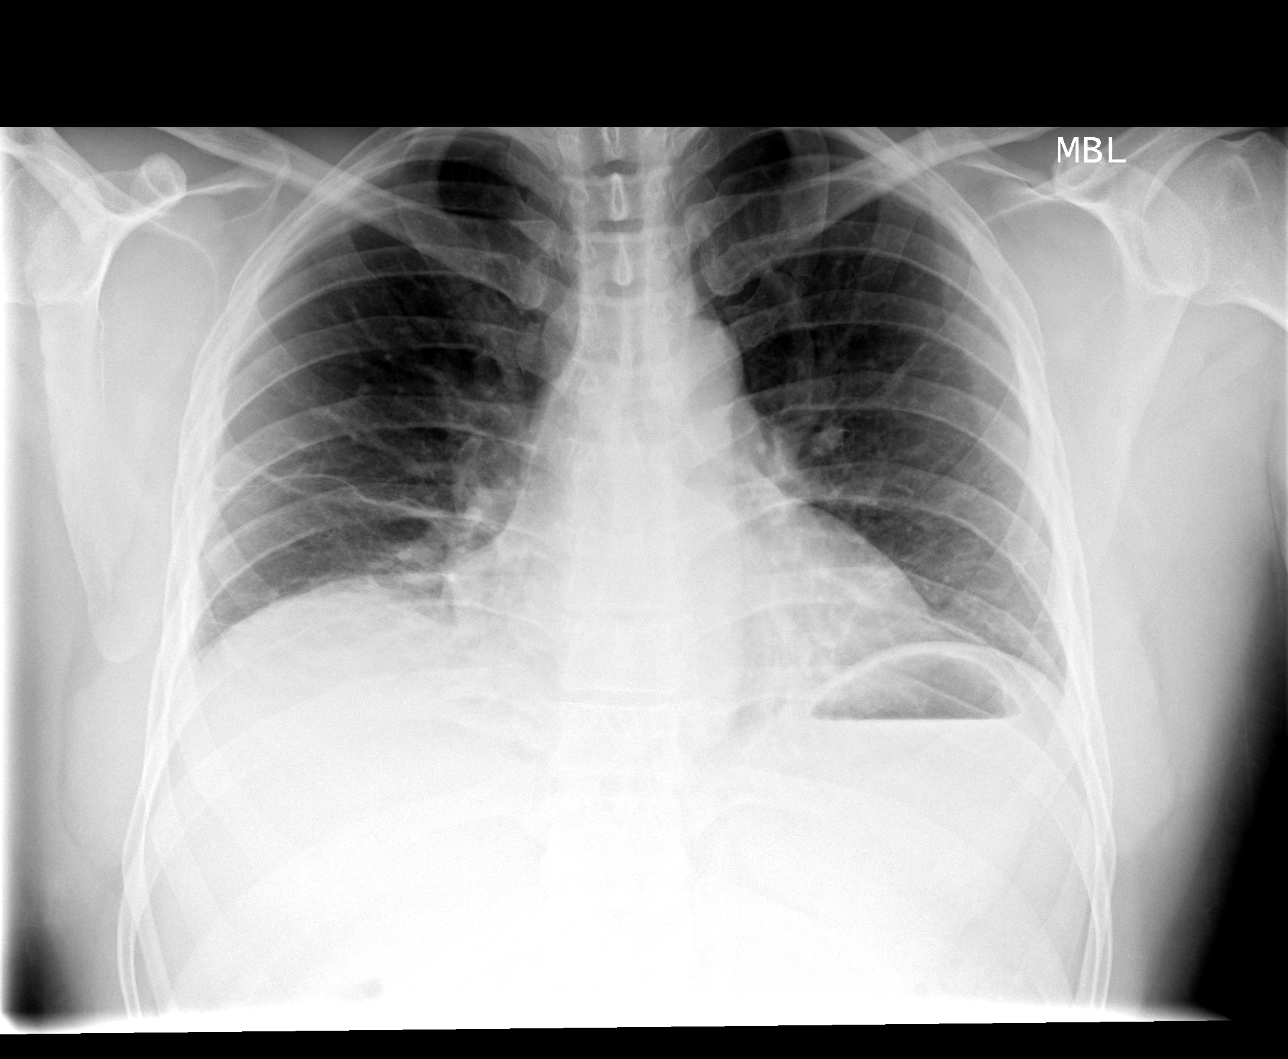

[view not recorded (2 of 3)]
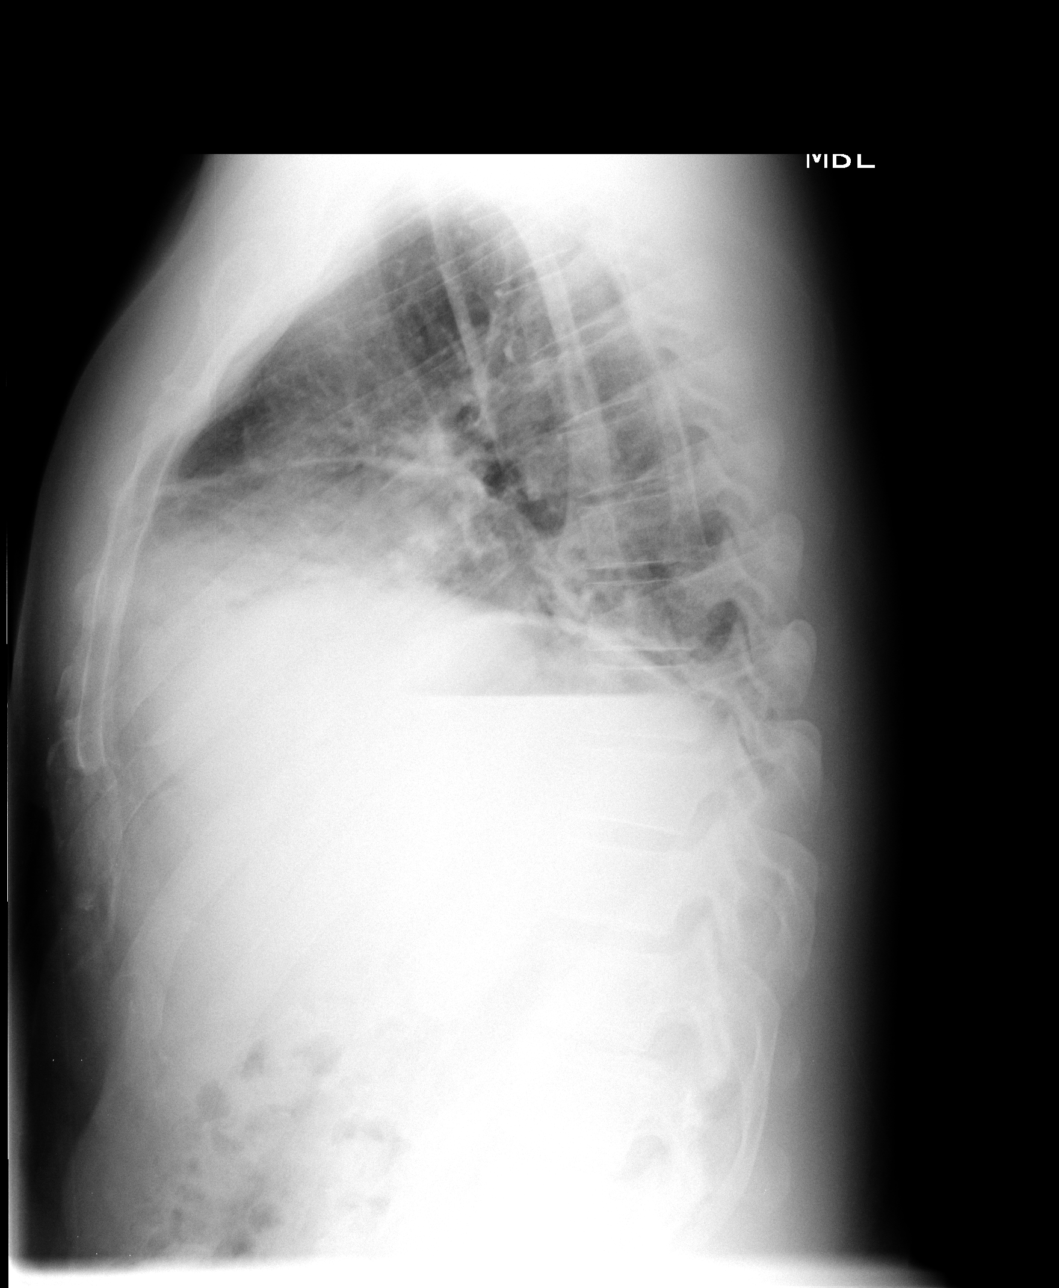

[view not recorded (3 of 3)]
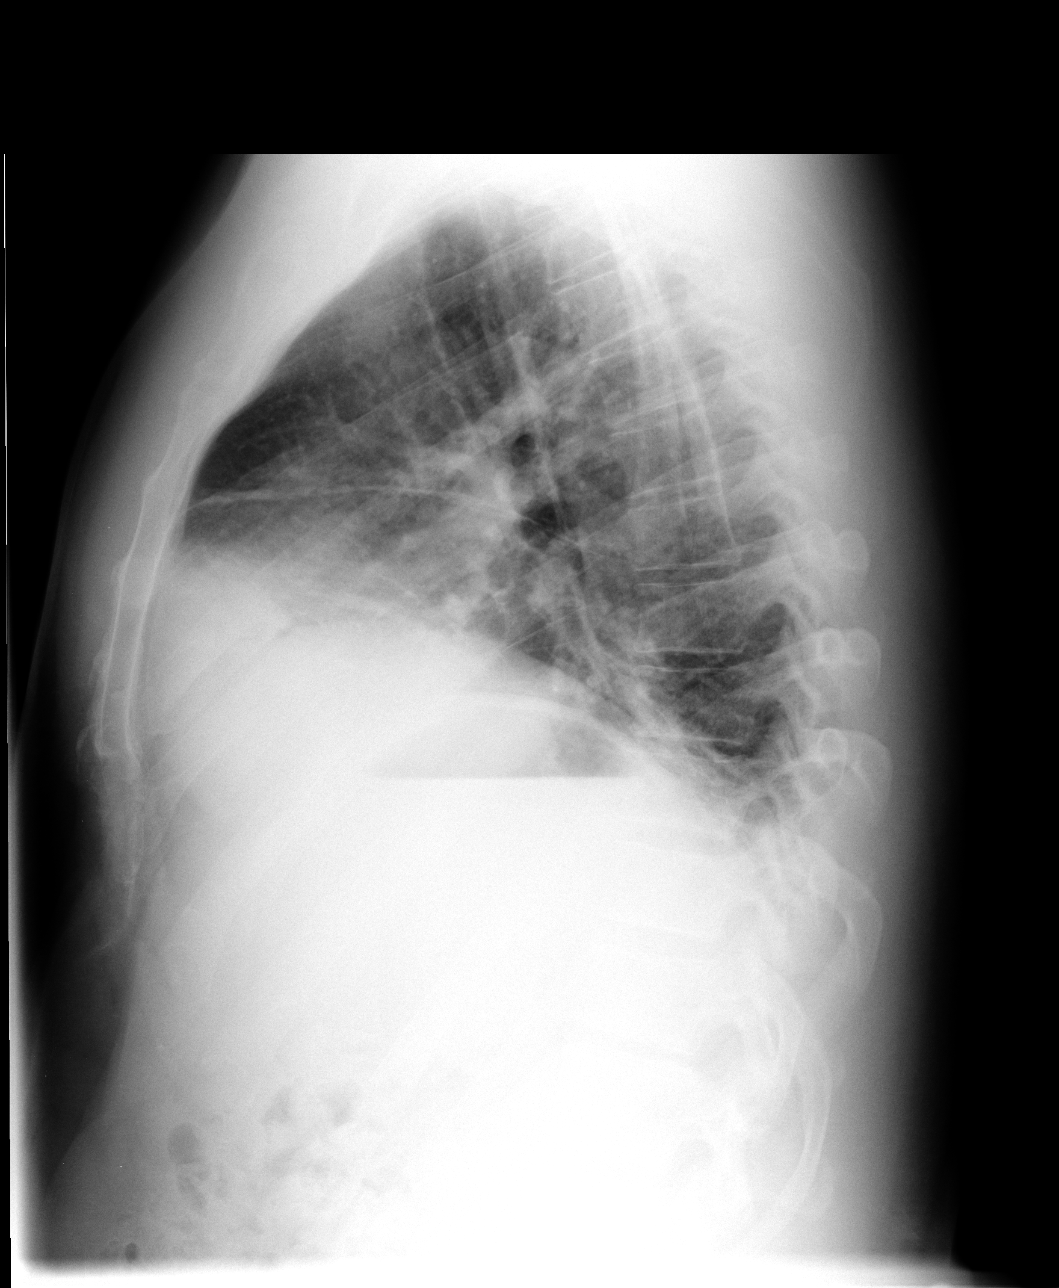

[3 of 3 positions shown; findings below may reference images not displayed]

FINDINGS: Normal heart size.  Decreased lung volumes.  No pleural
effusion identified.  There is atelectasis involving the both lung
bases right greater than left.  Right middle lobe infiltrate is
also suspected based upon opacity seen on the lateral radiograph..
IMPRESSION: 1.  Lungs are suboptimally inflated.
2.  Bibasilar atelectasis.
3.  Based on the lateral radiograph I suspect there may be a right
middle lobe infiltrate.

## 2014-07-15 IMAGING — CT CT ANGIO CHEST
2 of 7 series · 19 of 36 positions shown · IV contrast (APPLIED)
Comparison: Chest x-ray 11/13/2012

CLINICAL DATA: Pleuritic chest pain.  Rule out pulmonary embolism.

CT ANGIOGRAPHY CHEST
TECHNIQUE: Multidetector CT imaging of the chest using the
standard protocol during bolus administration of intravenous
contrast. Multiplanar reconstructed images including MIPs were
obtained and reviewed to evaluate the vascular anatomy.
Contrast: 80mL OMNIPAQUE IOHEXOL 350 MG/ML SOLN

[Series 9: pulm embolism 1.0 b25f st · axial · 0.66mm/px · z∈[-398,-204]mm · 18 of 218 slices shown]
[im 12/218  lung]
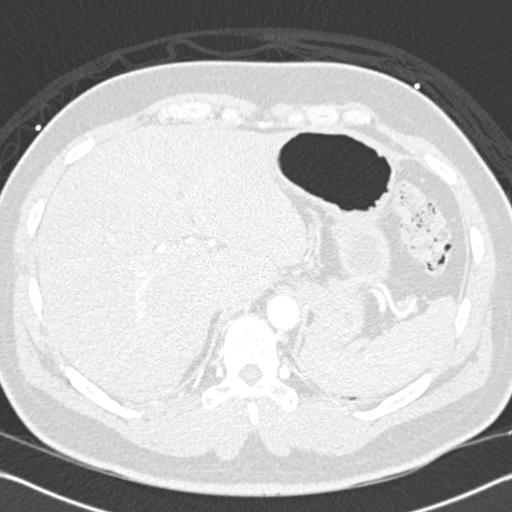
[im 23/218  mediastinal]
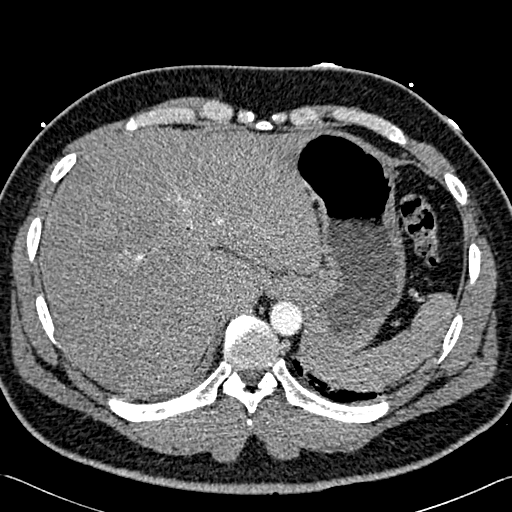
[im 35/218  lung]
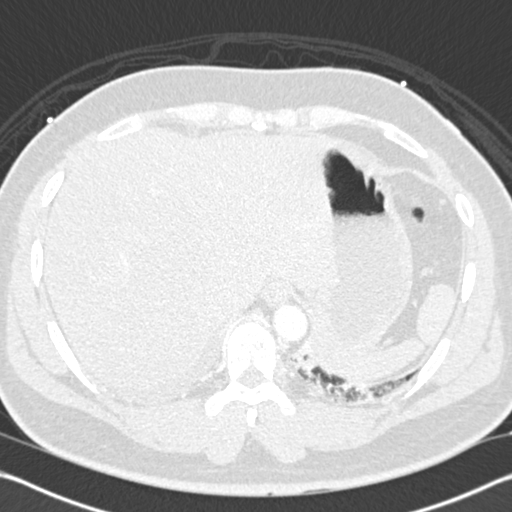
[im 46/218  mediastinal]
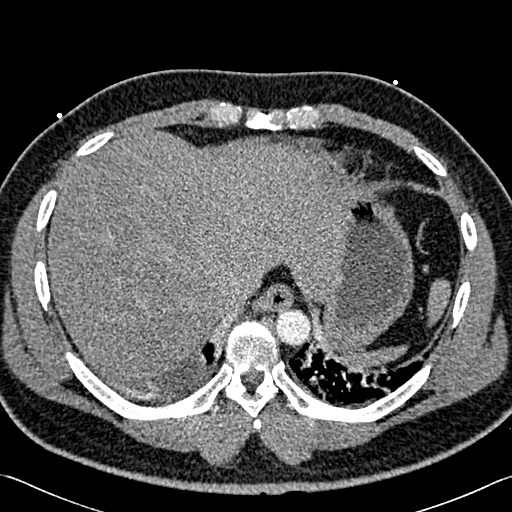
[im 58/218  lung]
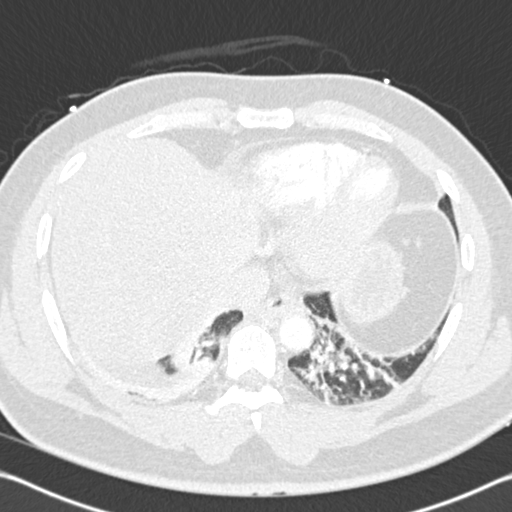
[im 69/218  mediastinal]
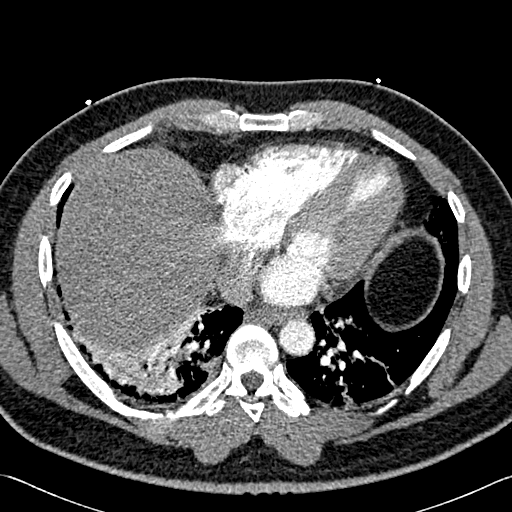
[im 80/218  lung]
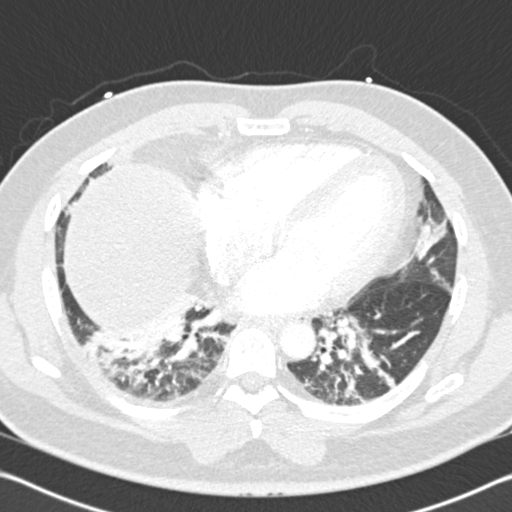
[im 92/218  mediastinal]
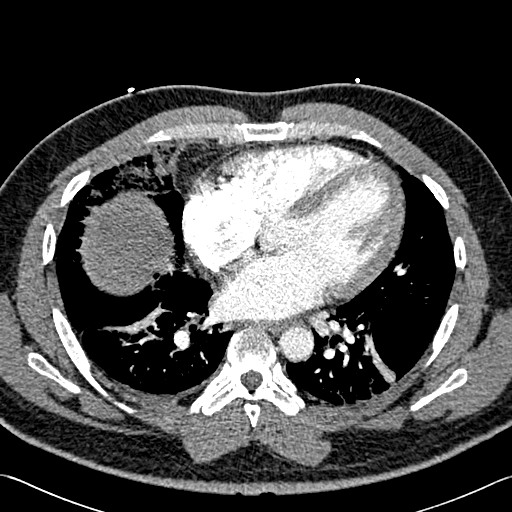
[im 103/218  lung]
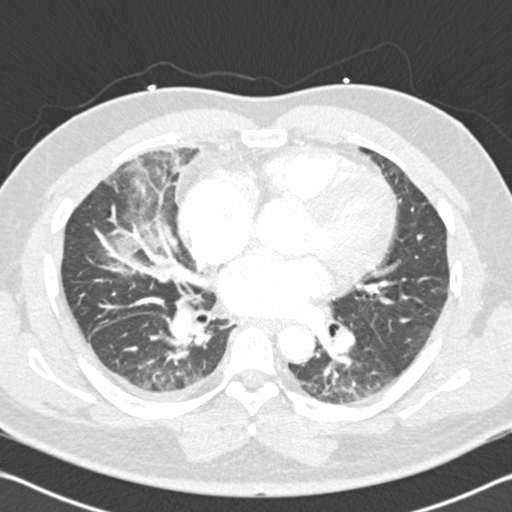
[im 115/218  mediastinal]
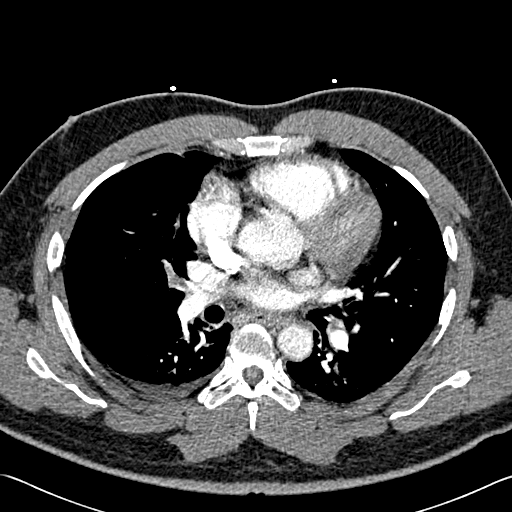
[im 126/218  lung]
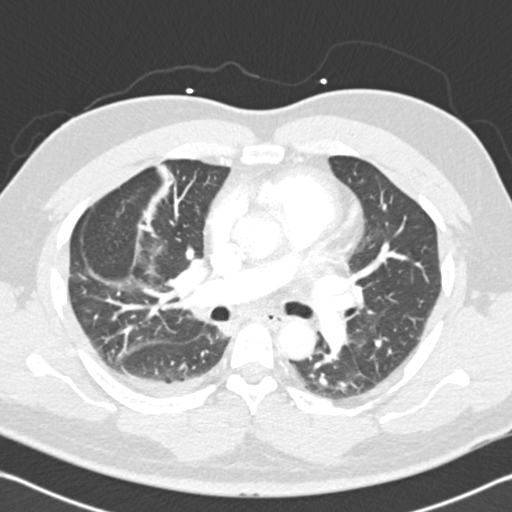
[im 138/218  mediastinal]
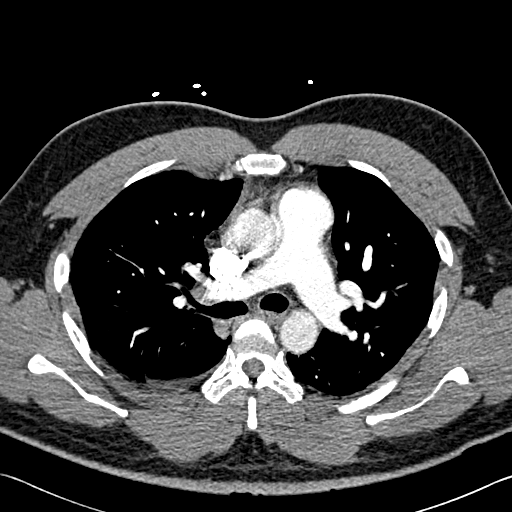
[im 149/218  lung]
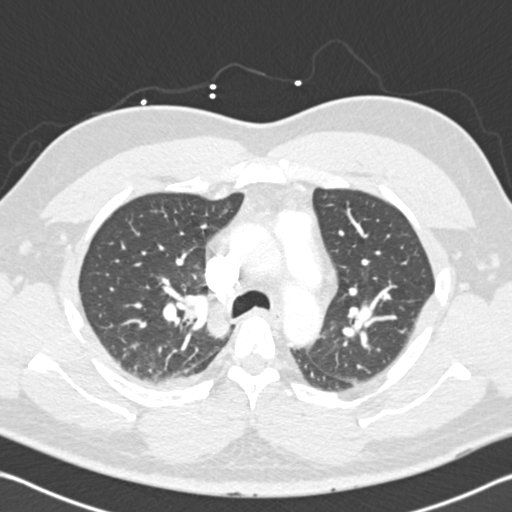
[im 160/218  mediastinal]
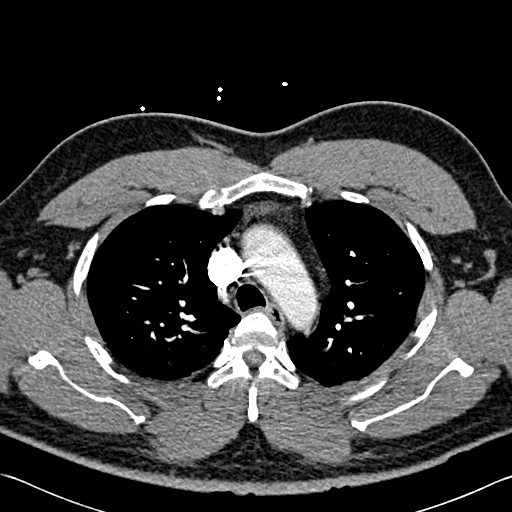
[im 172/218  lung]
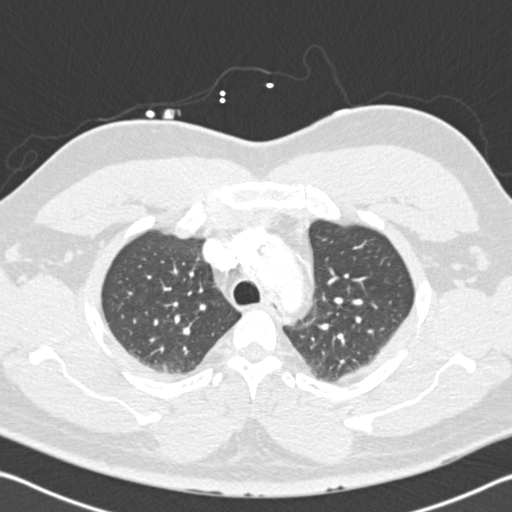
[im 183/218  mediastinal]
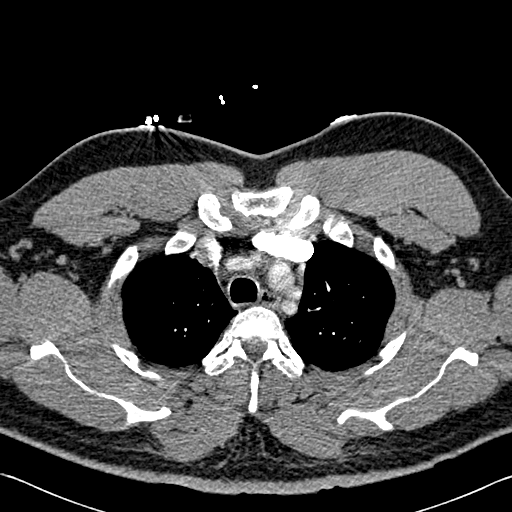
[im 195/218  lung]
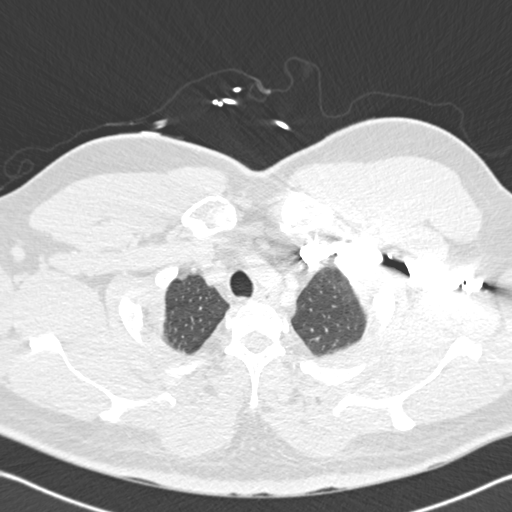
[im 206/218  mediastinal]
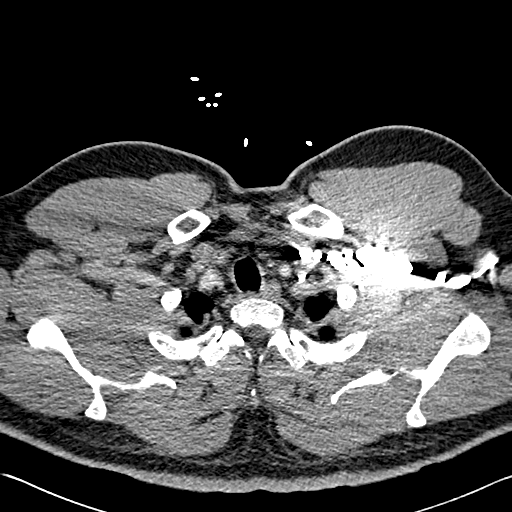

[Series 11: coronals · coronal · 0.63mm/px · 1 of 104 slices shown]
[im 52/104  mediastinal]
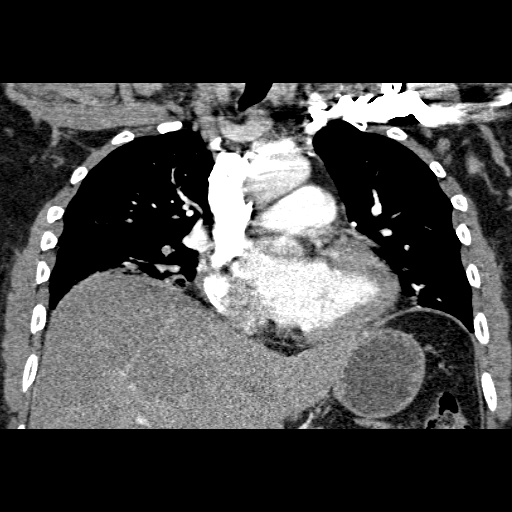

[19 of 36 positions shown; findings below may reference images not displayed]

FINDINGS: Positive for pulmonary embolism right middle lobe and
right lower lobe.  There is infiltrate in the right middle lobe
which may be pulmonary infarction.  No emboli in the left lung.

There is bibasilar atelectasis right greater than left.  Small
right pleural effusion.

Negative for aortic dissection or aneurysm.  Heart size is upper
normal.  No significant coronary artery calcification is
identified.  No mass or adenopathy.  No acute bony abnormality.
IMPRESSION: Pulmonary embolism right middle lobe and right lower lobe pulmonary
arteries.  Probable infarct in the right middle lobe.

Bibasilar atelectasis and small right pleural effusion.

## 2014-10-25 ENCOUNTER — Other Ambulatory Visit: Payer: Self-pay | Admitting: Internal Medicine

## 2014-11-29 ENCOUNTER — Other Ambulatory Visit: Payer: Self-pay | Admitting: Internal Medicine

## 2014-12-19 ENCOUNTER — Other Ambulatory Visit (INDEPENDENT_AMBULATORY_CARE_PROVIDER_SITE_OTHER): Payer: 59

## 2014-12-19 ENCOUNTER — Encounter: Payer: Self-pay | Admitting: Internal Medicine

## 2014-12-19 ENCOUNTER — Ambulatory Visit (INDEPENDENT_AMBULATORY_CARE_PROVIDER_SITE_OTHER): Payer: 59 | Admitting: Internal Medicine

## 2014-12-19 VITALS — BP 120/90 | HR 67 | Temp 97.6°F | Resp 16 | Ht 69.0 in | Wt 217.5 lb

## 2014-12-19 DIAGNOSIS — R739 Hyperglycemia, unspecified: Secondary | ICD-10-CM | POA: Diagnosis not present

## 2014-12-19 DIAGNOSIS — K760 Fatty (change of) liver, not elsewhere classified: Secondary | ICD-10-CM

## 2014-12-19 DIAGNOSIS — R7989 Other specified abnormal findings of blood chemistry: Secondary | ICD-10-CM

## 2014-12-19 DIAGNOSIS — R945 Abnormal results of liver function studies: Secondary | ICD-10-CM

## 2014-12-19 DIAGNOSIS — E785 Hyperlipidemia, unspecified: Secondary | ICD-10-CM

## 2014-12-19 DIAGNOSIS — Z0000 Encounter for general adult medical examination without abnormal findings: Secondary | ICD-10-CM

## 2014-12-19 LAB — HEPATITIS C ANTIBODY: HCV Ab: NEGATIVE

## 2014-12-19 LAB — PSA: PSA: 0.59 ng/mL (ref 0.10–4.00)

## 2014-12-19 LAB — COMPREHENSIVE METABOLIC PANEL
ALT: 37 U/L (ref 0–53)
AST: 36 U/L (ref 0–37)
Albumin: 4.5 g/dL (ref 3.5–5.2)
Alkaline Phosphatase: 65 U/L (ref 39–117)
BUN: 15 mg/dL (ref 6–23)
CHLORIDE: 102 meq/L (ref 96–112)
CO2: 33 mEq/L — ABNORMAL HIGH (ref 19–32)
Calcium: 10 mg/dL (ref 8.4–10.5)
Creatinine, Ser: 1.11 mg/dL (ref 0.40–1.50)
GFR: 74.24 mL/min (ref >60.00)
GLUCOSE: 89 mg/dL (ref 70–99)
Potassium: 3.8 mEq/L (ref 3.5–5.1)
Sodium: 141 mEq/L (ref 135–145)
Total Bilirubin: 0.5 mg/dL (ref 0.2–1.2)
Total Protein: 7.4 g/dL (ref 6.0–8.3)

## 2014-12-19 LAB — CBC WITH DIFFERENTIAL/PLATELET
BASOS PCT: 0.2 % (ref 0.0–3.0)
Basophils Absolute: 0 10*3/uL (ref 0.0–0.1)
EOS PCT: 2.1 % (ref 0.0–5.0)
Eosinophils Absolute: 0.1 10*3/uL (ref 0.0–0.7)
HCT: 43.4 % (ref 39.0–52.0)
Hemoglobin: 14.6 g/dL (ref 13.0–17.0)
Lymphocytes Relative: 34.3 % (ref 12.0–46.0)
Lymphs Abs: 1.3 10*3/uL (ref 0.7–4.0)
MCHC: 33.7 g/dL (ref 30.0–36.0)
MCV: 78.7 fl (ref 78.0–100.0)
Monocytes Absolute: 0.4 10*3/uL (ref 0.1–1.0)
Monocytes Relative: 10.2 % (ref 3.0–12.0)
Neutro Abs: 2 10*3/uL (ref 1.4–7.7)
Neutrophils Relative %: 53.2 % (ref 43.0–77.0)
Platelets: 192 10*3/uL (ref 150.0–400.0)
RBC: 5.52 Mil/uL (ref 4.22–5.81)
RDW: 13.9 % (ref 11.5–15.5)
WBC: 3.7 10*3/uL — ABNORMAL LOW (ref 4.0–10.5)

## 2014-12-19 LAB — TSH: TSH: 0.95 u[IU]/mL (ref 0.35–4.50)

## 2014-12-19 LAB — LIPID PANEL
Cholesterol: 274 mg/dL — ABNORMAL HIGH (ref 0–200)
HDL: 55 mg/dL (ref >39.00)
LDL CALC: 180 mg/dL — AB (ref 0–99)
NonHDL: 219
Total CHOL/HDL Ratio: 5
Triglycerides: 195 mg/dL — ABNORMAL HIGH (ref 0.0–149.0)
VLDL: 39 mg/dL (ref 0.0–40.0)

## 2014-12-19 LAB — FECAL OCCULT BLOOD, GUAIAC: Fecal Occult Blood: NEGATIVE

## 2014-12-19 LAB — HEMOGLOBIN A1C: Hgb A1c MFr Bld: 5.6 % (ref 4.6–6.5)

## 2014-12-19 LAB — HEPATITIS B SURFACE ANTIBODY,QUALITATIVE: HEP B S AB: NEGATIVE

## 2014-12-19 LAB — HEPATITIS A ANTIBODY, TOTAL: HEP A TOTAL AB: NONREACTIVE

## 2014-12-19 LAB — HEPATITIS B CORE ANTIBODY, TOTAL: HEP B C TOTAL AB: NONREACTIVE

## 2014-12-19 MED ORDER — ROSUVASTATIN CALCIUM 20 MG PO TABS: 20.0000 mg | ORAL_TABLET | Freq: Every day | ORAL | Status: DC

## 2014-12-19 NOTE — Patient Instructions (Signed)

## 2014-12-19 NOTE — Progress Notes (Signed)
Pre visit review using our clinic review tool, if applicable. No additional management support is needed unless otherwise documented below in the visit note. 

## 2014-12-19 NOTE — Progress Notes (Signed)
Subjective:  Patient ID: Willie Foster, male    DOB: 03-14-1966  Age: 49 y.o. MRN: [id number]  CC: Annual Exam; Hypertension; and Hyperlipidemia   HPI Willie Foster presents for a CPX - he feels well and offers no complaints, he wants to have an HIV test done.  Outpatient Prescriptions Prior to Visit  Medication Sig Dispense Refill  . acyclovir (ZOVIRAX) 400 MG tablet TAKE ONE TABLET BY MOUTH THREE TIMES DAILY AS NEEDED FOR  FEVER  BLISTERS 30 tablet 11  . atenolol-chlorthalidone (TENORETIC) 50-25 MG per tablet TAKE ONE TABLET BY MOUTH ONCE DAILY 90 tablet 1  . Cetirizine HCl (ZYRTEC ALLERGY) 10 MG CAPS Take 10 mg by mouth daily.    . Cholecalciferol 2000 UNITS CAPS Take 1 capsule (2,000 Units total) by mouth daily. 30 each   . cyclobenzaprine (FLEXERIL) 10 MG tablet Take 1 tablet (10 mg total) by mouth 3 (three) times daily as needed for muscle spasms. 90 tablet 0  . KLOR-CON M20 20 MEQ tablet TAKE ONE TABLET BY MOUTH TWICE DAILY 60 tablet 5  . OVER THE COUNTER MEDICATION Take by mouth 2 (two) times daily. L-Carnaitine/amino acid-1098m   3 in am and one in pm    . Turmeric 500 MG CAPS Take 1 tablet by mouth 2 (two) times daily.    .Alveda Reasons20 MG TABS tablet TAKE ONE TABLET BY MOUTH ONCE DAILY WITH SUPPER 90 tablet 1   No facility-administered medications prior to visit.    ROS Review of Systems  Constitutional: Negative.  Negative for fever, chills, diaphoresis, appetite change and fatigue.  HENT: Negative.   Eyes: Negative.   Respiratory: Negative.  Negative for cough, choking, chest tightness, shortness of breath and stridor.   Cardiovascular: Negative.  Negative for chest pain, palpitations and leg swelling.  Gastrointestinal: Positive for blood in stool. Negative for nausea, vomiting, abdominal pain, diarrhea and constipation.       He had one episode of blood mixed in with his stool a week ago, it has not recurred since then.  Endocrine: Negative.   Genitourinary:  Negative.  Negative for difficulty urinating.  Musculoskeletal: Negative.  Negative for myalgias and arthralgias.  Skin: Negative.   Allergic/Immunologic: Negative.   Neurological: Negative.  Negative for dizziness, light-headedness, numbness and headaches.  Hematological: Negative.   Psychiatric/Behavioral: Negative.     Objective:  BP 120/90 mmHg  Pulse 67  Temp(Src) 97.6 F (36.4 C) (Oral)  Resp 16  Ht 5' 9"  (1.753 m)  Wt 217 lb 8 oz (98.657 kg)  BMI 32.10 kg/m2  SpO2 98%  BP Readings from Last 3 Encounters:  12/19/14 120/90  06/18/14 138/80  05/29/14 134/88    Wt Readings from Last 3 Encounters:  12/19/14 217 lb 8 oz (98.657 kg)  05/29/14 232 lb (105.235 kg)  05/15/14 229 lb 8 oz (104.101 kg)    Physical Exam  Constitutional: He is oriented to person, place, and time. He appears well-developed and well-nourished. No distress.  HENT:  Nose: Nose normal.  Mouth/Throat: Oropharynx is clear and moist.  Eyes: Conjunctivae are normal. Right eye exhibits no discharge. Left eye exhibits no discharge. No scleral icterus.  Neck: Normal range of motion. Neck supple. No JVD present. No tracheal deviation present. No thyromegaly present.  Cardiovascular: Normal rate, regular rhythm, normal heart sounds and intact distal pulses.  Exam reveals no gallop and no friction rub.   No murmur heard. Pulmonary/Chest: Effort normal and breath sounds normal. No stridor. No respiratory distress.  He has no wheezes. He has no rales. He exhibits no tenderness.  Abdominal: Soft. Bowel sounds are normal. He exhibits no distension and no mass. There is no tenderness. There is no rebound and no guarding. Hernia confirmed negative in the right inguinal area and confirmed negative in the left inguinal area.  Genitourinary: Rectum normal, prostate normal, testes normal and penis normal. Rectal exam shows no external hemorrhoid, no internal hemorrhoid, no fissure, no mass, no tenderness and anal tone  normal. Guaiac negative stool. Prostate is not enlarged and not tender. Right testis shows no mass, no swelling and no tenderness. Right testis is descended. Left testis shows no mass, no swelling and no tenderness. Left testis is descended. Circumcised. No penile erythema or penile tenderness. No discharge found.  Musculoskeletal: Normal range of motion. He exhibits no edema or tenderness.  Lymphadenopathy:    He has no cervical adenopathy.       Right: No inguinal adenopathy present.       Left: No inguinal adenopathy present.  Neurological: He is oriented to person, place, and time.  Skin: Skin is warm and dry. No rash noted. He is not diaphoretic. No erythema. No pallor.  Psychiatric: He has a normal mood and affect. His behavior is normal. Judgment and thought content normal.  Vitals reviewed.   Lab Results  Component Value Date   WBC 3.7* 12/19/2014   HGB 14.6 12/19/2014   HCT 43.4 12/19/2014   PLT 192.0 12/19/2014   GLUCOSE 89 12/19/2014   CHOL 274* 12/19/2014   TRIG 195.0* 12/19/2014   HDL 55.00 12/19/2014   LDLCALC 180* 12/19/2014   ALT 37 12/19/2014   AST 36 12/19/2014   NA 141 12/19/2014   K 3.8 12/19/2014   CL 102 12/19/2014   CREATININE 1.11 12/19/2014   BUN 15 12/19/2014   CO2 33* 12/19/2014   TSH 0.95 12/19/2014   PSA 0.59 12/19/2014   INR 1.00 11/13/2012   HGBA1C 5.6 12/19/2014    Dg Chest 2 View  06/18/2014   CLINICAL DATA:  LEFT-sided chest pain since this morning.  EXAM: CHEST  2 VIEW  COMPARISON:  11/18/2012.  FINDINGS: Cardiopericardial silhouette within normal limits. Mediastinal contours normal. Trachea midline. No airspace disease or effusion.  IMPRESSION: No active cardiopulmonary disease.   Electronically Signed   By: Dereck Ligas M.D.   On: 06/18/2014 20:33    Assessment & Plan:   Willie Foster was seen today for annual exam, hypertension and hyperlipidemia.  Diagnoses and all orders for this visit:  Abnormal LFTs- his liver enzymes are normal  now. Screening for hepatitis A, B, and C are negative. I have advised him to come in for hepatitis A and B vaccines. Orders: -     Hepatitis A antibody, total; Future -     Hepatitis B core antibody, total; Future -     Hepatitis B surface antibody; Future -     Hepatitis C antibody; Future  Hyperglycemia- improvement noted with his lifestyle modifications. Orders: -     Hemoglobin A1c; Future  Routine general medical examination at a health care facility- exam done, vaccines were reviewed and updated, labs ordered and reviewed, colonoscopy is up-to-date, medication education material was given. Orders: -     Comprehensive metabolic panel; Future -     CBC with Differential/Platelet; Future -     Lipid panel; Future -     TSH; Future -     PSA; Future -     HIV antibody;  Future  Fatty liver- improvement noted.  Hyperlipidemia with target LDL less than 130- his Framingham risk score is 8%, I have asked him to start taking a statin for risk reduction. Orders: -     rosuvastatin (CRESTOR) 20 MG tablet; Take 1 tablet (20 mg total) by mouth daily.  I am having Willie Foster start on rosuvastatin. I am also having him maintain his Cetirizine HCl, OVER THE COUNTER MEDICATION, Turmeric, Cholecalciferol, KLOR-CON M20, cyclobenzaprine, acyclovir, XARELTO, and atenolol-chlorthalidone.  Meds ordered this encounter  Medications  . rosuvastatin (CRESTOR) 20 MG tablet    Sig: Take 1 tablet (20 mg total) by mouth daily.    Dispense:  90 tablet    Refill:  3     Follow-up: Return in about 6 months (around 06/21/2015).  Scarlette Calico, MD

## 2014-12-20 ENCOUNTER — Encounter: Payer: Self-pay | Admitting: Internal Medicine

## 2014-12-20 LAB — HIV ANTIBODY (ROUTINE TESTING W REFLEX): HIV: NONREACTIVE

## 2015-01-14 ENCOUNTER — Ambulatory Visit (INDEPENDENT_AMBULATORY_CARE_PROVIDER_SITE_OTHER): Payer: 59 | Admitting: Oncology

## 2015-01-14 ENCOUNTER — Encounter: Payer: Self-pay | Admitting: Oncology

## 2015-01-14 VITALS — BP 175/99 | HR 72 | Temp 98.0°F | Ht 69.0 in | Wt 224.1 lb

## 2015-01-14 DIAGNOSIS — D72819 Decreased white blood cell count, unspecified: Secondary | ICD-10-CM

## 2015-01-14 DIAGNOSIS — I2699 Other pulmonary embolism without acute cor pulmonale: Secondary | ICD-10-CM | POA: Diagnosis not present

## 2015-01-14 MED ORDER — RIVAROXABAN 20 MG PO TABS: 20.0000 mg | ORAL_TABLET | Freq: Every day | ORAL | Status: DC

## 2015-01-14 NOTE — Patient Instructions (Signed)
Have your blood pressure checked next week with your primary care MD Continue Xarelto Visit with Dr Darnell Level in 1 year - lab can be done at primary MD office

## 2015-01-14 NOTE — Progress Notes (Signed)
Patient ID: Deunta Beneke, male   DOB: 04-02-1966, 49 y.o.   MRN: [id number] Hematology and Oncology Follow Up Visit  Brylin Stanislawski [id number] 12-21-1965 49 y.o. 01/14/2015 9:57 AM   Principle Diagnosis: Encounter Diagnoses  Name Primary?  . Pulmonary embolism Yes  . Leukopenia    clinical summary: 49 year old man in overall excellent health without any major medical or surgical illness. He started to develop vague, intermittent, right-sided chest pain which occurred on and off during the month of May, 2014. He had a worsening of symptoms and presented to a local urgent care center where he was advised to go to the emergency department. CT scan angiogram of the chest done on June 22 showed pulmonary emboli in the right middle and right lower lobes. He was admitted to the hospital and initially started on unfractionated heparin and then changed to Xarelto. His symptoms completely resolved. Venous Doppler studies of his lower extremities done on November 14, 2012 negative for clot.  CXR on admission June 22 no mass or infiltrate CT angiogram other than the pulmonary emboli showed no mass or adenopathy. He had no recent prolonged travel, no infection, no immobilization; no surgery. He had no constitutional symptoms. He has no signs or symptoms of a collagen vascular disorder. He admits that on rare occasions when he strains at stools he sees a small amount of blood on the toilet tissue. A subsequent   colonoscopy was normal 05/03/13 except for internal hemorrhoids which were sclerosed at time of the procedure. There is a strong family history of cancer. His mother died of complications of breast cancer metastatic to brain. Father died of lung cancer. He has a 70 year old sister who has triplets. No one in the family with history of blood clots. He lost a child at age 22 with a ruptured cerebral aneurysm. He has no other children. He is single at this time but has a steady girlfriend. He has no HIV risk  factors and specifically denies use of recreational drugs, male sex, or previous blood transfusions.  Special hematology studies: Negative for the presence of the factor V Leiden and the prothrombin gene mutations Normal functional protein S, free protein S, and protein C activity Normal antithrombin III Low positive anticardiolipin antibody IgG 31 units but negative beta-2 glycoprotein 1 antibodies including IgG subclass. Positive lupus anticoagulant likely spurious due to the use of Xarelto with a negative hexagonal phospholipid screen. Serum immunoglobulins normal and no monoclonal proteins on immunofixation electrophoresis.  Interim History:  He remained stable on long-term Xarelto 20 mg daily. He denies any dyspnea, chest pain, or palpitations. No bleeding. He just had a visit with his primary care physician last month. He was started on a statin for elevated cholesterol. He is currently on Tenoretic for blood pressure control. He has had no interim medical problems. He remains single. He is still selling high end cars.   Medications: reviewed  Allergies:  Allergies  Allergen Reactions  . Lipitor [Atorvastatin]     Elevated LFT's    Review of Systems: See history of present illness Remaining ROS negative:   Physical Exam: Blood pressure 175/99, pulse 72, temperature 98 F (36.7 C), temperature source Oral, height 5' 9"  (1.753 m), weight 224 lb 1.6 oz (101.651 kg), SpO2 100 %. Wt Readings from Last 3 Encounters:  01/14/15 224 lb 1.6 oz (101.651 kg)  12/19/14 217 lb 8 oz (98.657 kg)  05/29/14 232 lb (105.235 kg)   repeat blood pressure with large cuff: 185/105  General appearance: Well-nourished African-American man HENNT: Pharynx no erythema, exudate, mass, or ulcer. No thyromegaly or thyroid nodules Lymph nodes: No cervical, supraclavicular, or axillary lymphadenopathy Breasts:  Lungs: Clear to auscultation, resonant to percussion throughout Heart: Regular rhythm, no  murmur, no gallop, no rub, no click, no edema Abdomen: Soft, nontender, normal bowel sounds, no mass, no organomegaly Extremities: No edema, no calf tenderness Musculoskeletal: no joint deformities GU:  Vascular: Carotid pulses 2+, no bruits,  Neurologic: Alert, oriented, PERRLA, optic discs sharp and vessels normal, no hemorrhage or exudate, cranial nerves grossly normal, motor strength 5 over 5, reflexes 1+ symmetric, upper body coordination normal, gait normal, Skin: No rash or ecchymosis  Lab Results: CBC W/Diff    Component Value Date/Time   WBC 3.7* 12/19/2014 1004   WBC 4.2 05/12/2013 1536   RBC 5.52 12/19/2014 1004   RBC 5.37 05/12/2013 1536   RBC 5.50 11/18/2012 1214   HGB 14.6 12/19/2014 1004   HGB 13.3 05/12/2013 1536   HCT 43.4 12/19/2014 1004   HCT 41.9 05/12/2013 1536   PLT 192.0 12/19/2014 1004   PLT 190 05/12/2013 1536   MCV 78.7 12/19/2014 1004   MCV 78.2* 05/12/2013 1536   MCH 26.5 01/02/2014 0914   MCH 24.9* 05/12/2013 1536   MCHC 33.7 12/19/2014 1004   MCHC 31.8* 05/12/2013 1536   RDW 13.9 12/19/2014 1004   RDW 14.0 05/12/2013 1536   LYMPHSABS 1.3 12/19/2014 1004   LYMPHSABS 1.8 05/12/2013 1536   MONOABS 0.4 12/19/2014 1004   MONOABS 0.5 05/12/2013 1536   EOSABS 0.1 12/19/2014 1004   EOSABS 0.1 05/12/2013 1536   BASOSABS 0.0 12/19/2014 1004   BASOSABS 0.0 05/12/2013 1536     Chemistry      Component Value Date/Time   NA 141 12/19/2014 1004   NA 141 01/18/2013 1158   K 3.8 12/19/2014 1004   K 3.6 01/18/2013 1158   CL 102 12/19/2014 1004   CO2 33* 12/19/2014 1004   CO2 25 01/18/2013 1158   BUN 15 12/19/2014 1004   BUN 12.5 01/18/2013 1158   CREATININE 1.11 12/19/2014 1004   CREATININE 1.17 01/02/2014 0914   CREATININE 1.0 01/18/2013 1158      Component Value Date/Time   CALCIUM 10.0 12/19/2014 1004   CALCIUM 9.8 01/18/2013 1158   ALKPHOS 65 12/19/2014 1004   ALKPHOS 63 01/18/2013 1158   AST 36 12/19/2014 1004   AST 30 01/18/2013 1158    ALT 37 12/19/2014 1004   ALT 45 01/18/2013 1158   BILITOT 0.5 12/19/2014 1004   BILITOT 0.47 01/18/2013 1158       Radiological Studies: No results found.  Impression:  #1. Idiopathic unprovoked pulmonary emboli Additional data published in July 2015 in the Journal of the American Medical Association specifically looking at recurrence of emboli in patients with idiopathic unprovoked initial events,  reinforced previous data there there is no tail on the curve. In this study, everyone was treated for one year. Patients with then randomized to observation versus an additional year of anticoagulation. Both groups were then followed for an additional year. As long as one stayed on therapeutic anticoagulation, they were protected. The group randomized to observation alone started having recurrent clots. The group randomized to an additional year of anticoagulation started having clots at the same frequency as the control group as soon as they stopped therapeutic anticoagulation. One provocative study looking at apixiban 5 mg twice daily therapeutic dose versus 2.5 mg twice daily prophylactic dose following one  year at the therapeutic dose, showed equivalent protection for the lower dose without any increase in bleeding. This may be a strategy for the future. I discussed this with the patient. We do not have similar data for Xarelto. He would prefer to stay on the single daily dose Xarelto rather than switching to the twice a day dose of apixiban.  #2. Essential hypertension Blood pressure today unacceptably high. The reading is an outlier compared with multiple previous values recorded. I told him that he needs to get back with his primary care physician next week to make sure that his blood pressure is in fact under good control especially since he is on a long-term blood thinner.  #3. Elevated cholesterol Recently started on a statin.  CC: Patient Care Team: [name], MD as PCP - General  (Internal Medicine)   Annia Belt, MD 8/22/20169:57 AM

## 2015-02-12 ENCOUNTER — Ambulatory Visit (INDEPENDENT_AMBULATORY_CARE_PROVIDER_SITE_OTHER): Payer: 59 | Admitting: Internal Medicine

## 2015-02-12 ENCOUNTER — Encounter: Payer: Self-pay | Admitting: Internal Medicine

## 2015-02-12 VITALS — BP 162/102 | HR 76 | Temp 98.1°F | Resp 16 | Ht 69.0 in | Wt 222.0 lb

## 2015-02-12 DIAGNOSIS — I10 Essential (primary) hypertension: Secondary | ICD-10-CM

## 2015-02-12 DIAGNOSIS — E876 Hypokalemia: Secondary | ICD-10-CM

## 2015-02-12 DIAGNOSIS — T502X5A Adverse effect of carbonic-anhydrase inhibitors, benzothiadiazides and other diuretics, initial encounter: Secondary | ICD-10-CM

## 2015-02-12 MED ORDER — POTASSIUM CHLORIDE CRYS ER 20 MEQ PO TBCR: 20.0000 meq | EXTENDED_RELEASE_TABLET | Freq: Two times a day (BID) | ORAL | Status: DC

## 2015-02-12 MED ORDER — ATENOLOL-CHLORTHALIDONE 50-25 MG PO TABS: 1.0000 | ORAL_TABLET | Freq: Every day | ORAL | Status: DC

## 2015-02-12 NOTE — Progress Notes (Signed)
Pre visit review using our clinic review tool, if applicable. No additional management support is needed unless otherwise documented below in the visit note. 

## 2015-02-12 NOTE — Patient Instructions (Signed)

## 2015-02-13 ENCOUNTER — Encounter: Payer: Self-pay | Admitting: Internal Medicine

## 2015-02-13 NOTE — Progress Notes (Signed)
Subjective:  Patient ID: Willie Foster, male    DOB: 02/23/1966  Age: 49 y.o. MRN: [id number]  CC: Hypertension   HPI Cesar Rogerson presents for a blood pressure check. He saw his hematologist about a month ago and his blood pressure was elevated at 175/99. In questioning him further he appears to have stopped taking the previous blood pressure medicine which was a combination of atenolol and chlorthalidone. When I saw him about 2 or 3 months ago he was on that medication and his blood pressure was well controlled. He does not know how or why the blood pressure medicine got left off. His only complaint is that he has had a couple of visual disturbances and some pressure in the front of his head. The visual disturbance is described as occasionally seeing spots.  Outpatient Prescriptions Prior to Visit  Medication Sig Dispense Refill  . acyclovir (ZOVIRAX) 400 MG tablet TAKE ONE TABLET BY MOUTH THREE TIMES DAILY AS NEEDED FOR  FEVER  BLISTERS 30 tablet 11  . Cetirizine HCl (ZYRTEC ALLERGY) 10 MG CAPS Take 10 mg by mouth daily.    . Cholecalciferol 2000 UNITS CAPS Take 1 capsule (2,000 Units total) by mouth daily. 30 each   . cyclobenzaprine (FLEXERIL) 10 MG tablet Take 1 tablet (10 mg total) by mouth 3 (three) times daily as needed for muscle spasms. 90 tablet 0  . OVER THE COUNTER MEDICATION Take by mouth 2 (two) times daily. L-Carnaitine/amino acid-1071m   3 in am and one in pm    . rivaroxaban (XARELTO) 20 MG TABS tablet Take 1 tablet (20 mg total) by mouth daily with supper. 90 tablet 3  . rosuvastatin (CRESTOR) 20 MG tablet Take 1 tablet (20 mg total) by mouth daily. 90 tablet 3  . Turmeric 500 MG CAPS Take 1 tablet by mouth 2 (two) times daily.    .Marland Kitchenatenolol-chlorthalidone (TENORETIC) 50-25 MG per tablet TAKE ONE TABLET BY MOUTH ONCE DAILY 90 tablet 1  . KLOR-CON M20 20 MEQ tablet TAKE ONE TABLET BY MOUTH TWICE DAILY 60 tablet 5   No facility-administered medications prior to visit.     ROS Review of Systems  Constitutional: Negative.  Negative for fever, chills, diaphoresis, appetite change and fatigue.  HENT: Negative for trouble swallowing and voice change.   Eyes: Positive for visual disturbance. Negative for photophobia and redness.  Respiratory: Negative.  Negative for cough, choking, chest tightness, shortness of breath and stridor.   Cardiovascular: Negative.  Negative for chest pain, palpitations and leg swelling.  Gastrointestinal: Negative.  Negative for nausea, vomiting, abdominal pain, diarrhea, constipation and blood in stool.  Endocrine: Negative.   Genitourinary: Negative.   Musculoskeletal: Negative.  Negative for myalgias, back pain, joint swelling and arthralgias.  Skin: Negative.  Negative for rash.  Allergic/Immunologic: Negative.   Neurological: Positive for headaches. Negative for dizziness, syncope, speech difficulty, weakness, light-headedness and numbness.  Hematological: Negative.  Negative for adenopathy. Does not bruise/bleed easily.  Psychiatric/Behavioral: Negative.     Objective:  BP 162/102 mmHg  Pulse 75  Temp(Src) 98.3 F (36.8 C) (Oral)  Resp 16  Ht 5' 9"  (1.753 m)  Wt 222 lb (100.699 kg)  BMI 32.77 kg/m2  SpO2 97%  BP Readings from Last 3 Encounters:  02/12/15 162/102  01/14/15 175/99  12/19/14 120/90    Wt Readings from Last 3 Encounters:  02/12/15 222 lb (100.699 kg)  01/14/15 224 lb 1.6 oz (101.651 kg)  12/19/14 217 lb 8 oz (98.657 kg)  Physical Exam  Constitutional: He is oriented to person, place, and time. No distress.  HENT:  Head: Normocephalic and atraumatic.  Mouth/Throat: Oropharynx is clear and moist. No oropharyngeal exudate.  Eyes: Conjunctivae are normal. Right eye exhibits no discharge. Left eye exhibits no discharge. No scleral icterus.  Neck: Normal range of motion. Neck supple. No JVD present. No tracheal deviation present. No thyromegaly present.  Cardiovascular: Normal rate, regular  rhythm, normal heart sounds and intact distal pulses.  Exam reveals no gallop and no friction rub.   No murmur heard. Pulmonary/Chest: Effort normal and breath sounds normal. No stridor. No respiratory distress. He has no wheezes. He has no rales. He exhibits no tenderness.  Abdominal: Soft. Bowel sounds are normal. He exhibits no distension and no mass. There is no tenderness. There is no rebound and no guarding.  Musculoskeletal: Normal range of motion. He exhibits no edema or tenderness.  Lymphadenopathy:    He has no cervical adenopathy.  Neurological: He is oriented to person, place, and time.  Skin: Skin is warm and dry. No rash noted. He is not diaphoretic. No erythema. No pallor.  Psychiatric: He has a normal mood and affect. His behavior is normal. Judgment and thought content normal.  Vitals reviewed.   Lab Results  Component Value Date   WBC 3.7* 12/19/2014   HGB 14.6 12/19/2014   HCT 43.4 12/19/2014   PLT 192.0 12/19/2014   GLUCOSE 89 12/19/2014   CHOL 274* 12/19/2014   TRIG 195.0* 12/19/2014   HDL 55.00 12/19/2014   LDLCALC 180* 12/19/2014   ALT 37 12/19/2014   AST 36 12/19/2014   NA 141 12/19/2014   K 3.8 12/19/2014   CL 102 12/19/2014   CREATININE 1.11 12/19/2014   BUN 15 12/19/2014   CO2 33* 12/19/2014   TSH 0.95 12/19/2014   PSA 0.59 12/19/2014   INR 1.00 11/13/2012   HGBA1C 5.6 12/19/2014    Dg Chest 2 View  06/18/2014   CLINICAL DATA:  LEFT-sided chest pain since this morning.  EXAM: CHEST  2 VIEW  COMPARISON:  11/18/2012.  FINDINGS: Cardiopericardial silhouette within normal limits. Mediastinal contours normal. Trachea midline. No airspace disease or effusion.  IMPRESSION: No active cardiopulmonary disease.   Electronically Signed   By: Dereck Ligas M.D.   On: 06/18/2014 20:33    Assessment & Plan:   Jerami was seen today for hypertension.  Diagnoses and all orders for this visit:  Essential hypertension, benign- his blood pressure is not well  controlled, this is due to noncompliance, he will restart his prior medication which was Tenoretic. He will continue to work on his lifestyle modifications with diet/exercise/weight loss. -     potassium chloride SA (KLOR-CON M20) 20 MEQ tablet; Take 1 tablet (20 mEq total) by mouth 2 (two) times daily. -     atenolol-chlorthalidone (TENORETIC) 50-25 MG per tablet; Take 1 tablet by mouth daily.  Diuretic-induced hypokalemia -     potassium chloride SA (KLOR-CON M20) 20 MEQ tablet; Take 1 tablet (20 mEq total) by mouth 2 (two) times daily.   I have changed Mr. Nawabi's KLOR-CON M20 to potassium chloride SA. I have also changed his atenolol-chlorthalidone. I am also having him maintain his Cetirizine HCl, OVER THE COUNTER MEDICATION, Turmeric, Cholecalciferol, cyclobenzaprine, acyclovir, rosuvastatin, and rivaroxaban.  Meds ordered this encounter  Medications  . potassium chloride SA (KLOR-CON M20) 20 MEQ tablet    Sig: Take 1 tablet (20 mEq total) by mouth 2 (two) times daily.  Dispense:  60 tablet    Refill:  5  . atenolol-chlorthalidone (TENORETIC) 50-25 MG per tablet    Sig: Take 1 tablet by mouth daily.    Dispense:  90 tablet    Refill:  1     Follow-up: Return in about 2 months (around 04/14/2015).  Scarlette Calico, MD

## 2015-05-14 ENCOUNTER — Ambulatory Visit (INDEPENDENT_AMBULATORY_CARE_PROVIDER_SITE_OTHER): Payer: 59 | Admitting: Internal Medicine

## 2015-05-14 ENCOUNTER — Encounter: Payer: Self-pay | Admitting: Internal Medicine

## 2015-05-14 VITALS — BP 120/88 | HR 60 | Temp 98.6°F | Resp 16 | Ht 69.0 in | Wt 220.0 lb

## 2015-05-14 DIAGNOSIS — Z23 Encounter for immunization: Secondary | ICD-10-CM | POA: Diagnosis not present

## 2015-05-14 DIAGNOSIS — H66002 Acute suppurative otitis media without spontaneous rupture of ear drum, left ear: Secondary | ICD-10-CM | POA: Diagnosis not present

## 2015-05-14 MED ORDER — AMOXICILLIN-POT CLAVULANATE 875-125 MG PO TABS: 1.0000 | ORAL_TABLET | Freq: Two times a day (BID) | ORAL | Status: AC

## 2015-05-14 NOTE — Progress Notes (Signed)
Pre visit review using our clinic review tool, if applicable. No additional management support is needed unless otherwise documented below in the visit note. 

## 2015-05-14 NOTE — Patient Instructions (Signed)
Otitis Media With Effusion Otitis media with effusion is the presence of fluid in the middle ear. This is a common problem in children, which often follows ear infections. It may be present for weeks or longer after the infection. Unlike an acute ear infection, otitis media with effusion refers only to fluid behind the ear drum and not infection. Children with repeated ear and sinus infections and allergy problems are the most likely to get otitis media with effusion. CAUSES  The most frequent cause of the fluid buildup is dysfunction of the eustachian tubes. These are the tubes that drain fluid in the ears to the back of the nose (nasopharynx). SYMPTOMS   The main symptom of this condition is hearing loss. As a result, you or your child may:  Listen to the TV at a loud volume.  Not respond to questions.  Ask "what" often when spoken to.  Mistake or confuse one sound or word for another.  There may be a sensation of fullness or pressure but usually not pain. DIAGNOSIS   Your health care provider will diagnose this condition by examining you or your child's ears.  Your health care provider may test the pressure in you or your child's ear with a tympanometer.  A hearing test may be conducted if the problem persists. TREATMENT   Treatment depends on the duration and the effects of the effusion.  Antibiotics, decongestants, nose drops, and cortisone-type drugs (tablets or nasal spray) may not be helpful.  Children with persistent ear effusions may have delayed language or behavioral problems. Children at risk for developmental delays in hearing, learning, and speech may require referral to a specialist earlier than children not at risk.  You or your child's health care provider may suggest a referral to an ear, nose, and throat surgeon for treatment. The following may help restore normal hearing:  Drainage of fluid.  Placement of ear tubes (tympanostomy tubes).  Removal of adenoids  (adenoidectomy). HOME CARE INSTRUCTIONS   Avoid secondhand smoke.  Infants who are breastfed are less likely to have this condition.  Avoid feeding infants while they are lying flat.  Avoid known environmental allergens.  Avoid people who are sick. SEEK MEDICAL CARE IF:   Hearing is not better in 3 months.  Hearing is worse.  Ear pain.  Drainage from the ear.  Dizziness. MAKE SURE YOU:   Understand these instructions.  Will watch your condition.  Will get help right away if you are not doing well or get worse.   This information is not intended to replace advice given to you by your health care provider. Make sure you discuss any questions you have with your health care provider.   Document Released: 06/18/2004 Document Revised: 06/01/2014 Document Reviewed: 12/06/2012 Elsevier Interactive Patient Education [employer].

## 2015-05-15 NOTE — Progress Notes (Signed)
Subjective:  Patient ID: Willie Foster, male    DOB: 04-Apr-1966  Age: 49 y.o. MRN: [id number]  CC: Otalgia   HPI Willie Foster presents for left earache with loss of hearing for 5 days.  Outpatient Prescriptions Prior to Visit  Medication Sig Dispense Refill  . acyclovir (ZOVIRAX) 400 MG tablet TAKE ONE TABLET BY MOUTH THREE TIMES DAILY AS NEEDED FOR  FEVER  BLISTERS 30 tablet 11  . atenolol-chlorthalidone (TENORETIC) 50-25 MG per tablet Take 1 tablet by mouth daily. 90 tablet 1  . Cetirizine HCl (ZYRTEC ALLERGY) 10 MG CAPS Take 10 mg by mouth daily.    . Cholecalciferol 2000 UNITS CAPS Take 1 capsule (2,000 Units total) by mouth daily. 30 each   . OVER THE COUNTER MEDICATION Take by mouth 2 (two) times daily. L-Carnaitine/amino acid-1081m   3 in am and one in pm    . potassium chloride SA (KLOR-CON M20) 20 MEQ tablet Take 1 tablet (20 mEq total) by mouth 2 (two) times daily. 60 tablet 5  . rivaroxaban (XARELTO) 20 MG TABS tablet Take 1 tablet (20 mg total) by mouth daily with supper. 90 tablet 3  . rosuvastatin (CRESTOR) 20 MG tablet Take 1 tablet (20 mg total) by mouth daily. 90 tablet 3  . Turmeric 500 MG CAPS Take 1 tablet by mouth 2 (two) times daily.    . cyclobenzaprine (FLEXERIL) 10 MG tablet Take 1 tablet (10 mg total) by mouth 3 (three) times daily as needed for muscle spasms. 90 tablet 0   No facility-administered medications prior to visit.    ROS Review of Systems  Constitutional: Negative.  Negative for fever, chills and fatigue.  HENT: Positive for ear pain and hearing loss. Negative for congestion, ear discharge, facial swelling, sinus pressure, sore throat and trouble swallowing.   Eyes: Negative.   Respiratory: Negative.  Negative for cough, choking, chest tightness, shortness of breath and stridor.   Cardiovascular: Negative.  Negative for chest pain, palpitations and leg swelling.  Gastrointestinal: Negative.  Negative for abdominal pain.  Endocrine: Negative.    Genitourinary: Negative.   Musculoskeletal: Negative.  Negative for myalgias and back pain.  Skin: Negative.  Negative for color change and rash.  Allergic/Immunologic: Negative.   Neurological: Negative.   Hematological: Negative.  Negative for adenopathy. Does not bruise/bleed easily.  Psychiatric/Behavioral: Negative.     Objective:  BP 120/88 mmHg  Pulse 60  Temp(Src) 98.6 F (37 C) (Oral)  Ht 5' 9"  (1.753 m)  Wt 220 lb (99.791 kg)  BMI 32.47 kg/m2  SpO2 95%  BP Readings from Last 3 Encounters:  05/14/15 120/88  02/12/15 162/102  01/14/15 175/99    Wt Readings from Last 3 Encounters:  05/14/15 220 lb (99.791 kg)  02/12/15 222 lb (100.699 kg)  01/14/15 224 lb 1.6 oz (101.651 kg)    Physical Exam  Constitutional: He is oriented to person, place, and time.  Non-toxic appearance. He does not have a sickly appearance. He does not appear ill. No distress.  HENT:  Right Ear: Hearing, tympanic membrane, external ear and ear canal normal.  Left Ear: No lacerations. No drainage, swelling or tenderness. No foreign bodies. No mastoid tenderness. Tympanic membrane is bulging. Tympanic membrane is not scarred, not perforated, not erythematous and not retracted. Tympanic membrane mobility is abnormal. A middle ear effusion (purulent) is present. No hemotympanum. No decreased hearing is noted.  Mouth/Throat: Oropharynx is clear and moist and mucous membranes are normal. Mucous membranes are not pale, not  dry and not cyanotic. No oral lesions. No trismus in the jaw. No uvula swelling. No oropharyngeal exudate, posterior oropharyngeal edema, posterior oropharyngeal erythema or tonsillar abscesses.  Eyes: Conjunctivae are normal. Right eye exhibits no discharge. Left eye exhibits no discharge. No scleral icterus.  Neck: Normal range of motion. Neck supple. No JVD present. No tracheal deviation present. No thyromegaly present.  Cardiovascular: Normal rate, regular rhythm, normal heart  sounds and intact distal pulses.  Exam reveals no gallop and no friction rub.   No murmur heard. Pulmonary/Chest: Effort normal and breath sounds normal. No stridor. No respiratory distress. He has no wheezes. He has no rales. He exhibits no tenderness.  Abdominal: Soft. Bowel sounds are normal. He exhibits no distension and no mass. There is no tenderness. There is no rebound and no guarding.  Musculoskeletal: Normal range of motion. He exhibits no edema or tenderness.  Lymphadenopathy:    He has no cervical adenopathy.  Neurological: He is oriented to person, place, and time.  Skin: Skin is warm and dry. No rash noted. He is not diaphoretic. No erythema. No pallor.  Vitals reviewed.   Lab Results  Component Value Date   WBC 3.7* 12/19/2014   HGB 14.6 12/19/2014   HCT 43.4 12/19/2014   PLT 192.0 12/19/2014   GLUCOSE 89 12/19/2014   CHOL 274* 12/19/2014   TRIG 195.0* 12/19/2014   HDL 55.00 12/19/2014   LDLCALC 180* 12/19/2014   ALT 37 12/19/2014   AST 36 12/19/2014   NA 141 12/19/2014   K 3.8 12/19/2014   CL 102 12/19/2014   CREATININE 1.11 12/19/2014   BUN 15 12/19/2014   CO2 33* 12/19/2014   TSH 0.95 12/19/2014   PSA 0.59 12/19/2014   INR 1.00 11/13/2012   HGBA1C 5.6 12/19/2014    Dg Chest 2 View  06/18/2014  CLINICAL DATA:  LEFT-sided chest pain since this morning. EXAM: CHEST  2 VIEW COMPARISON:  11/18/2012. FINDINGS: Cardiopericardial silhouette within normal limits. Mediastinal contours normal. Trachea midline. No airspace disease or effusion. IMPRESSION: No active cardiopulmonary disease. Electronically Signed   By: Dereck Ligas M.D.   On: 06/18/2014 20:33    Assessment & Plan:   Willie Foster was seen today for otalgia.  Diagnoses and all orders for this visit:  Left acute suppurative otitis media -     amoxicillin-clavulanate (AUGMENTIN) 875-125 MG tablet; Take 1 tablet by mouth 2 (two) times daily.  Need for prophylactic vaccination and inoculation against  influenza -     Flu Vaccine QUAD 36+ mos IM   I have discontinued Willie Foster's cyclobenzaprine. I am also having him start on amoxicillin-clavulanate. Additionally, I am having him maintain his Cetirizine HCl, OVER THE COUNTER MEDICATION, Turmeric, Cholecalciferol, acyclovir, rosuvastatin, rivaroxaban, potassium chloride SA, and atenolol-chlorthalidone.  Meds ordered this encounter  Medications  . amoxicillin-clavulanate (AUGMENTIN) 875-125 MG tablet    Sig: Take 1 tablet by mouth 2 (two) times daily.    Dispense:  28 tablet    Refill:  0     Follow-up: Return in about 3 weeks (around 06/04/2015).  Scarlette Calico, MD

## 2015-05-29 ENCOUNTER — Ambulatory Visit (INDEPENDENT_AMBULATORY_CARE_PROVIDER_SITE_OTHER): Payer: 59 | Admitting: Internal Medicine

## 2015-05-29 ENCOUNTER — Encounter: Payer: Self-pay | Admitting: Internal Medicine

## 2015-05-29 VITALS — BP 106/64 | HR 60 | Temp 98.1°F | Resp 16 | Ht 69.0 in | Wt 218.0 lb

## 2015-05-29 DIAGNOSIS — H66002 Acute suppurative otitis media without spontaneous rupture of ear drum, left ear: Secondary | ICD-10-CM

## 2015-05-29 MED ORDER — AMOXICILLIN-POT CLAVULANATE 875-125 MG PO TABS: 1.0000 | ORAL_TABLET | Freq: Two times a day (BID) | ORAL | Status: DC

## 2015-05-29 NOTE — Progress Notes (Signed)
Pre visit review using our clinic review tool, if applicable. No additional management support is needed unless otherwise documented below in the visit note. 

## 2015-05-29 NOTE — Patient Instructions (Signed)
Otitis Media With Effusion Otitis media with effusion is the presence of fluid in the middle ear. This is a common problem in children, which often follows ear infections. It may be present for weeks or longer after the infection. Unlike an acute ear infection, otitis media with effusion refers only to fluid behind the ear drum and not infection. Children with repeated ear and sinus infections and allergy problems are the most likely to get otitis media with effusion. CAUSES  The most frequent cause of the fluid buildup is dysfunction of the eustachian tubes. These are the tubes that drain fluid in the ears to the back of the nose (nasopharynx). SYMPTOMS   The main symptom of this condition is hearing loss. As a result, you or your child may:  Listen to the TV at a loud volume.  Not respond to questions.  Ask "what" often when spoken to.  Mistake or confuse one sound or word for another.  There may be a sensation of fullness or pressure but usually not pain. DIAGNOSIS   Your health care provider will diagnose this condition by examining you or your child's ears.  Your health care provider may test the pressure in you or your child's ear with a tympanometer.  A hearing test may be conducted if the problem persists. TREATMENT   Treatment depends on the duration and the effects of the effusion.  Antibiotics, decongestants, nose drops, and cortisone-type drugs (tablets or nasal spray) may not be helpful.  Children with persistent ear effusions may have delayed language or behavioral problems. Children at risk for developmental delays in hearing, learning, and speech may require referral to a specialist earlier than children not at risk.  You or your child's health care provider may suggest a referral to an ear, nose, and throat surgeon for treatment. The following may help restore normal hearing:  Drainage of fluid.  Placement of ear tubes (tympanostomy tubes).  Removal of adenoids  (adenoidectomy). HOME CARE INSTRUCTIONS   Avoid secondhand smoke.  Infants who are breastfed are less likely to have this condition.  Avoid feeding infants while they are lying flat.  Avoid known environmental allergens.  Avoid people who are sick. SEEK MEDICAL CARE IF:   Hearing is not better in 3 months.  Hearing is worse.  Ear pain.  Drainage from the ear.  Dizziness. MAKE SURE YOU:   Understand these instructions.  Will watch your condition.  Will get help right away if you are not doing well or get worse.   This information is not intended to replace advice given to you by your health care provider. Make sure you discuss any questions you have with your health care provider.   Document Released: 06/18/2004 Document Revised: 06/01/2014 Document Reviewed: 12/06/2012 Elsevier Interactive Patient Education [employer].

## 2015-05-30 NOTE — Progress Notes (Signed)
Subjective:  Patient ID: Willie Foster, male    DOB: 1966/01/06  Age: 50 y.o. MRN: [id number]  CC: Ear Pain   HPI Willie Foster presents for f/up on left ear - it feels some better but not much.  Outpatient Prescriptions Prior to Visit  Medication Sig Dispense Refill  . acyclovir (ZOVIRAX) 400 MG tablet TAKE ONE TABLET BY MOUTH THREE TIMES DAILY AS NEEDED FOR  FEVER  BLISTERS 30 tablet 11  . atenolol-chlorthalidone (TENORETIC) 50-25 MG per tablet Take 1 tablet by mouth daily. 90 tablet 1  . Cetirizine HCl (ZYRTEC ALLERGY) 10 MG CAPS Take 10 mg by mouth daily.    . Cholecalciferol 2000 UNITS CAPS Take 1 capsule (2,000 Units total) by mouth daily. 30 each   . OVER THE COUNTER MEDICATION Take by mouth 2 (two) times daily. L-Carnaitine/amino acid-1091m   3 in am and one in pm    . potassium chloride SA (KLOR-CON M20) 20 MEQ tablet Take 1 tablet (20 mEq total) by mouth 2 (two) times daily. 60 tablet 5  . rivaroxaban (XARELTO) 20 MG TABS tablet Take 1 tablet (20 mg total) by mouth daily with supper. 90 tablet 3  . rosuvastatin (CRESTOR) 20 MG tablet Take 1 tablet (20 mg total) by mouth daily. 90 tablet 3  . Turmeric 500 MG CAPS Take 1 tablet by mouth 2 (two) times daily.     No facility-administered medications prior to visit.    ROS Review of Systems  Constitutional: Negative.  Negative for fever, chills and diaphoresis.  HENT: Positive for ear pain and hearing loss. Negative for ear discharge, facial swelling, sinus pressure, sore throat and trouble swallowing.   Eyes: Negative.   Respiratory: Negative.  Negative for cough.   Cardiovascular: Negative.   Gastrointestinal: Negative.  Negative for nausea, vomiting, abdominal pain, diarrhea and constipation.  Endocrine: Negative.   Genitourinary: Negative.   Musculoskeletal: Negative.   Skin: Negative.  Negative for rash.  Allergic/Immunologic: Negative.   Neurological: Negative.  Negative for dizziness.  Hematological: Negative.     Psychiatric/Behavioral: Negative.     Objective:  BP 106/64 mmHg  Pulse 60  Temp(Src) 98.1 F (36.7 C) (Oral)  Ht 5' 9"  (1.753 m)  Wt 218 lb (98.884 kg)  BMI 32.18 kg/m2  SpO2 98%  BP Readings from Last 3 Encounters:  05/29/15 106/64  05/14/15 120/88  02/12/15 162/102    Wt Readings from Last 3 Encounters:  05/29/15 218 lb (98.884 kg)  05/14/15 220 lb (99.791 kg)  02/12/15 222 lb (100.699 kg)    Physical Exam  Constitutional:  Non-toxic appearance. He does not have a sickly appearance. He does not appear ill. No distress.  HENT:  Right Ear: Hearing, tympanic membrane, external ear and ear canal normal.  Left Ear: Hearing, external ear and ear canal normal. No drainage, swelling or tenderness. Tympanic membrane is not injected, not scarred, not perforated, not erythematous and not retracted. A middle ear effusion (persistent yellow/green effusion) is present. No hemotympanum. No decreased hearing is noted.  Vitals reviewed.   Lab Results  Component Value Date   WBC 3.7* 12/19/2014   HGB 14.6 12/19/2014   HCT 43.4 12/19/2014   PLT 192.0 12/19/2014   GLUCOSE 89 12/19/2014   CHOL 274* 12/19/2014   TRIG 195.0* 12/19/2014   HDL 55.00 12/19/2014   LDLCALC 180* 12/19/2014   ALT 37 12/19/2014   AST 36 12/19/2014   NA 141 12/19/2014   K 3.8 12/19/2014   CL 102 12/19/2014  CREATININE 1.11 12/19/2014   BUN 15 12/19/2014   CO2 33* 12/19/2014   TSH 0.95 12/19/2014   PSA 0.59 12/19/2014   INR 1.00 11/13/2012   HGBA1C 5.6 12/19/2014    Dg Chest 2 View  06/18/2014  CLINICAL DATA:  LEFT-sided chest pain since this morning. EXAM: CHEST  2 VIEW COMPARISON:  11/18/2012. FINDINGS: Cardiopericardial silhouette within normal limits. Mediastinal contours normal. Trachea midline. No airspace disease or effusion. IMPRESSION: No active cardiopulmonary disease. Electronically Signed   By: Dereck Ligas M.D.   On: 06/18/2014 20:33    Assessment & Plan:   Willie Foster was seen today  for ear pain.  Diagnoses and all orders for this visit:  Left acute suppurative otitis media- I will refer to ENT to consider having a tympanostomy to drain the fluid, in the meantime will continue Augmentin. -     Ambulatory referral to ENT -     amoxicillin-clavulanate (AUGMENTIN) 875-125 MG tablet; Take 1 tablet by mouth 2 (two) times daily.   I am having Willie Foster start on amoxicillin-clavulanate. I am also having him maintain his Cetirizine HCl, OVER THE COUNTER MEDICATION, Turmeric, Cholecalciferol, acyclovir, rosuvastatin, rivaroxaban, potassium chloride SA, and atenolol-chlorthalidone.  Meds ordered this encounter  Medications  . amoxicillin-clavulanate (AUGMENTIN) 875-125 MG tablet    Sig: Take 1 tablet by mouth 2 (two) times daily.    Dispense:  20 tablet    Refill:  0     Follow-up: Return in about 3 weeks (around 06/19/2015).  Scarlette Calico, MD

## 2015-06-21 ENCOUNTER — Ambulatory Visit (INDEPENDENT_AMBULATORY_CARE_PROVIDER_SITE_OTHER): Payer: 59 | Admitting: Family Medicine

## 2015-06-21 ENCOUNTER — Encounter: Payer: Self-pay | Admitting: Family

## 2015-06-21 ENCOUNTER — Encounter: Payer: Self-pay | Admitting: Family Medicine

## 2015-06-21 ENCOUNTER — Encounter (INDEPENDENT_AMBULATORY_CARE_PROVIDER_SITE_OTHER): Payer: 59 | Admitting: Family

## 2015-06-21 VITALS — BP 118/80 | HR 48 | Ht 69.0 in | Wt 221.0 lb

## 2015-06-21 DIAGNOSIS — M533 Sacrococcygeal disorders, not elsewhere classified: Secondary | ICD-10-CM | POA: Diagnosis not present

## 2015-06-21 DIAGNOSIS — M9904 Segmental and somatic dysfunction of sacral region: Secondary | ICD-10-CM | POA: Diagnosis not present

## 2015-06-21 DIAGNOSIS — M9903 Segmental and somatic dysfunction of lumbar region: Secondary | ICD-10-CM

## 2015-06-21 DIAGNOSIS — M9902 Segmental and somatic dysfunction of thoracic region: Secondary | ICD-10-CM

## 2015-06-21 DIAGNOSIS — M999 Biomechanical lesion, unspecified: Secondary | ICD-10-CM

## 2015-06-21 MED ORDER — CYCLOBENZAPRINE HCL 10 MG PO TABS
10.0000 mg | ORAL_TABLET | Freq: Three times a day (TID) | ORAL | Status: DC | PRN
Start: 2015-06-21 — End: 2017-08-18

## 2015-06-21 NOTE — Progress Notes (Signed)
  [name] [address] [place], [place] [address] Phone: [phone] Subjective:     CC: Low back pain follow up.   HYI:FOYDXAJOIN Cisco Kindt is a 50 y.o. male coming in with complaint of low back pain.  Patient's low back pain seems to be more exacerbated recently. Patient has started a [employer]. Patient was doing very well and then the last day started having increasing pain. Seems to be low left side. No radiation down the leg or any numbness. States that it seems to be tight. Did take ibuprofen that has helped. Has not taken any of the muscle relaxers and is out of the home at this time.     Past medical history, social, surgical and family history all reviewed in electronic medical record.   Review of Systems: No headache, visual changes, nausea, vomiting, diarrhea, constipation, dizziness, abdominal pain, skin rash, fevers, chills, night sweats, weight loss, swollen lymph nodes, body aches, joint swelling, muscle aches, chest pain, shortness of breath, mood changes.   Objective Blood pressure 118/80, pulse 48, height 5' 9"  (1.753 m), weight 221 lb (100.245 kg), SpO2 98 %.  General: No apparent distress alert and oriented x3 mood and affect normal, dressed appropriately. Poor core strength HEENT: Pupils equal, extraocular movements intact  Respiratory: Patient's speak in full sentences and does not appear short of breath  Cardiovascular: No lower extremity edema, non tender, no erythema  Skin: Warm dry intact with no signs of infection or rash on extremities or on axial skeleton.  Abdomen: Soft nontender  Neuro: Cranial nerves II through XII are intact, neurovascularly intact in all extremities with 2+ DTRs and 2+ pulses.  Lymph: No lymphadenopathy of posterior or anterior cervical chain or axillae bilaterally.  Gait normal with good balance and coordination.  MSK:  Non tender with full range of motion and good stability and symmetric strength  and tone of shoulders, elbows, wrist, hip, knee and ankles bilaterally.  Back Exam:  Inspection: Mild increase in lordosis Motion: Flexion 35 deg, Extension 35 deg, Side Bending to 45 deg bilaterally,  Rotation to 45 deg bilaterally  SLR laying: Negative  XSLR laying: Negative  Palpable tenderness: Mild tenderness over the left SI joint but also over the left paraspinal musculature of the lumbar spine  FABER: Positive left. Sensory change: Gross sensation intact to all lumbar and sacral dermatomes.  Reflexes: 2+ at both patellar tendons, 2+ at achilles tendons, Babinski's downgoing.  Strength at foot  Plantar-flexion: 5/5 Dorsi-flexion: 5/5 Eversion: 5/5 Inversion: 5/5  Leg strength  Quad: 5/5 Hamstring: 5/5 Hip flexor: 5/5 Hip abductors:4 /5  Gait unremarkable.  Osteopathic findings Cervical C2 flexed rotated and side bent left  Thoracic T5 extended rotated and side bent right T8 extended rotated and side bent left  Lumbar L2 flexed rotated inside that right  Sacrum left on left    Impression and Recommendations:     This case required medical decision making of moderate complexity.

## 2015-06-21 NOTE — Assessment & Plan Note (Signed)
Exacerbation noted. The patient will do well with conservative therapy. Encourage patient to focus more on core strengthening exercises. We discussed icing regimen. Patient will come back and see me again in 4-6 weeks for further evaluation otherwise as needed.

## 2015-06-21 NOTE — Assessment & Plan Note (Signed)
Decision today to treat with OMT was based on Physical Exam  After verbal consent patient was treated with HVLA, ME techniques in sacral areas  Patient tolerated the procedure well with improvement in symptoms  Patient given exercises, stretches and lifestyle modifications  See medications in patient instructions if given  Patient will follow up in 4-10 weeks

## 2015-06-21 NOTE — Progress Notes (Signed)
Pre visit review using our clinic review tool, if applicable. No additional management support is needed unless otherwise documented below in the visit note. 

## 2015-06-21 NOTE — Patient Instructions (Signed)
Good to see yo u You know what to do Good luck with crtoss fit See me when you need me

## 2015-06-24 NOTE — Progress Notes (Signed)
Error

## 2015-08-01 ENCOUNTER — Telehealth: Payer: Self-pay | Admitting: Internal Medicine

## 2015-08-01 DIAGNOSIS — E785 Hyperlipidemia, unspecified: Secondary | ICD-10-CM

## 2015-08-01 MED ORDER — PITAVASTATIN CALCIUM 2 MG PO TABS: 1.0000 | ORAL_TABLET | Freq: Every day | ORAL | Status: DC

## 2015-08-01 NOTE — Telephone Encounter (Signed)
Pt stated that [facility] told him that they will not cover for rosuvastatin (CRESTOR) 20 MG tablet starting April 2017. Please advise, can he get something else that [facility] will cover.

## 2015-08-01 NOTE — Telephone Encounter (Signed)
Changed.

## 2015-08-01 NOTE — Telephone Encounter (Signed)
Pt aware.

## 2015-08-05 ENCOUNTER — Telehealth: Payer: Self-pay

## 2015-08-05 NOTE — Telephone Encounter (Signed)
PA initiated and approved (PA - [id number])  via CoverMyMeds Key [id number]

## 2015-08-05 NOTE — Telephone Encounter (Signed)
Approved thru 08/04/2016

## 2015-09-13 ENCOUNTER — Telehealth: Payer: Self-pay | Admitting: Internal Medicine

## 2015-09-13 DIAGNOSIS — I10 Essential (primary) hypertension: Secondary | ICD-10-CM

## 2015-09-13 MED ORDER — ATENOLOL-CHLORTHALIDONE 50-25 MG PO TABS: 1.0000 | ORAL_TABLET | Freq: Every day | ORAL | Status: DC

## 2015-09-13 MED ORDER — HYDROCHLOROTHIAZIDE 25 MG PO TABS: 25.0000 mg | ORAL_TABLET | Freq: Every day | ORAL | Status: DC

## 2015-09-13 MED ORDER — NEBIVOLOL HCL 10 MG PO TABS: 10.0000 mg | ORAL_TABLET | Freq: Every day | ORAL | Status: DC

## 2015-09-13 NOTE — Telephone Encounter (Signed)
Pt called in and said that [facility] told him that his   atenolol-chlorthalidone (TENORETIC) 50-25 MG per tablet [[id number]]     Is on back order for the manufacturer and he only has 3 left.  It there anything else that can be called in for him?

## 2015-09-13 NOTE — Telephone Encounter (Signed)
i was able to get a hold of [facility] and they have the Tenoretci available. Pt would like to stay on current med (tenoretic) Called to d/c bystolic and hydrodruril

## 2015-09-13 NOTE — Telephone Encounter (Signed)
Changed Stop tenoretic Start the 2 new ones

## 2015-09-13 NOTE — Telephone Encounter (Signed)
Spoke with pt. He has enough to last him until Monday/tues. Advised PCP out of office and would wait until he gets back to manage alterative for pt. Also will call pharmacy to see if anyone else has it in the surrounding area

## 2015-11-08 ENCOUNTER — Ambulatory Visit: Payer: 59 | Admitting: Family

## 2015-11-21 ENCOUNTER — Other Ambulatory Visit: Payer: Self-pay | Admitting: Internal Medicine

## 2015-11-22 ENCOUNTER — Telehealth: Payer: Self-pay | Admitting: *Deleted

## 2015-11-22 NOTE — Telephone Encounter (Signed)
Received fax stating this medication " Atenolol-chlor" 50/25 mg is on manufacturer back order w/no release date. Requesting alternative to be rx...Johny Chess

## 2015-11-22 NOTE — Telephone Encounter (Signed)
See Prior phone note from 4/21 He gets this med filled only at [facility] due to availability. Im not sure why [facility] keeps faxing over script. I had to call pt last time to confirm again

## 2015-11-24 NOTE — Telephone Encounter (Signed)
Do I need to do something about this?

## 2015-12-30 ENCOUNTER — Other Ambulatory Visit: Payer: Self-pay | Admitting: Internal Medicine

## 2015-12-30 DIAGNOSIS — E785 Hyperlipidemia, unspecified: Secondary | ICD-10-CM

## 2016-01-04 ENCOUNTER — Other Ambulatory Visit: Payer: Self-pay | Admitting: Internal Medicine

## 2016-01-04 DIAGNOSIS — E785 Hyperlipidemia, unspecified: Secondary | ICD-10-CM

## 2016-01-19 ENCOUNTER — Other Ambulatory Visit: Payer: Self-pay | Admitting: Oncology

## 2016-02-04 ENCOUNTER — Ambulatory Visit (INDEPENDENT_AMBULATORY_CARE_PROVIDER_SITE_OTHER): Payer: 59 | Admitting: Internal Medicine

## 2016-02-04 ENCOUNTER — Encounter: Payer: Self-pay | Admitting: Internal Medicine

## 2016-02-04 ENCOUNTER — Other Ambulatory Visit (INDEPENDENT_AMBULATORY_CARE_PROVIDER_SITE_OTHER): Payer: 59

## 2016-02-04 VITALS — BP 118/80 | HR 67 | Temp 97.8°F | Resp 16 | Ht 69.0 in | Wt 216.2 lb

## 2016-02-04 DIAGNOSIS — Z0000 Encounter for general adult medical examination without abnormal findings: Secondary | ICD-10-CM

## 2016-02-04 DIAGNOSIS — I10 Essential (primary) hypertension: Secondary | ICD-10-CM | POA: Diagnosis not present

## 2016-02-04 DIAGNOSIS — T502X5A Adverse effect of carbonic-anhydrase inhibitors, benzothiadiazides and other diuretics, initial encounter: Secondary | ICD-10-CM

## 2016-02-04 DIAGNOSIS — E876 Hypokalemia: Secondary | ICD-10-CM

## 2016-02-04 DIAGNOSIS — Z23 Encounter for immunization: Secondary | ICD-10-CM

## 2016-02-04 DIAGNOSIS — N522 Drug-induced erectile dysfunction: Secondary | ICD-10-CM | POA: Diagnosis not present

## 2016-02-04 DIAGNOSIS — E785 Hyperlipidemia, unspecified: Secondary | ICD-10-CM

## 2016-02-04 LAB — CBC WITH DIFFERENTIAL/PLATELET
BASOS ABS: 0 10*3/uL (ref 0.0–0.1)
Basophils Relative: 0.4 % (ref 0.0–3.0)
EOS PCT: 1.2 % (ref 0.0–5.0)
Eosinophils Absolute: 0.1 10*3/uL (ref 0.0–0.7)
HEMATOCRIT: 41.1 % (ref 39.0–52.0)
Hemoglobin: 14.1 g/dL (ref 13.0–17.0)
LYMPHS PCT: 28.2 % (ref 12.0–46.0)
Lymphs Abs: 1.3 10*3/uL (ref 0.7–4.0)
MCHC: 34.4 g/dL (ref 30.0–36.0)
MCV: 76.6 fl — AB (ref 78.0–100.0)
MONOS PCT: 8.4 % (ref 3.0–12.0)
Monocytes Absolute: 0.4 10*3/uL (ref 0.1–1.0)
NEUTROS ABS: 2.9 10*3/uL (ref 1.4–7.7)
Neutrophils Relative %: 61.8 % (ref 43.0–77.0)
PLATELETS: 169 10*3/uL (ref 150.0–400.0)
RBC: 5.36 Mil/uL (ref 4.22–5.81)
RDW: 14.6 % (ref 11.5–15.5)
WBC: 4.7 10*3/uL (ref 4.0–10.5)

## 2016-02-04 LAB — COMPREHENSIVE METABOLIC PANEL
ALT: 44 U/L (ref 0–53)
AST: 43 U/L — ABNORMAL HIGH (ref 0–37)
Albumin: 4.6 g/dL (ref 3.5–5.2)
Alkaline Phosphatase: 59 U/L (ref 39–117)
BILIRUBIN TOTAL: 0.4 mg/dL (ref 0.2–1.2)
BUN: 18 mg/dL (ref 6–23)
CALCIUM: 9.7 mg/dL (ref 8.4–10.5)
CO2: 32 meq/L (ref 19–32)
Chloride: 102 mEq/L (ref 96–112)
Creatinine, Ser: 1.23 mg/dL (ref 0.40–1.50)
GFR: 65.65 mL/min (ref >60.00)
Glucose, Bld: 98 mg/dL (ref 70–99)
POTASSIUM: 3.3 meq/L — AB (ref 3.5–5.1)
Sodium: 141 mEq/L (ref 135–145)
Total Protein: 7.1 g/dL (ref 6.0–8.3)

## 2016-02-04 LAB — URINALYSIS, ROUTINE W REFLEX MICROSCOPIC
Bilirubin Urine: NEGATIVE
KETONES UR: NEGATIVE
Leukocytes, UA: NEGATIVE
NITRITE: NEGATIVE
Specific Gravity, Urine: 1.015 (ref 1.000–1.030)
Total Protein, Urine: 30 — AB
URINE GLUCOSE: NEGATIVE
UROBILINOGEN UA: 0.2 (ref 0.0–1.0)
pH: 5.5 (ref 5.0–8.0)

## 2016-02-04 LAB — LIPID PANEL
CHOL/HDL RATIO: 3
Cholesterol: 205 mg/dL — ABNORMAL HIGH (ref 0–200)
HDL: 63.6 mg/dL (ref >39.00)
LDL CALC: 104 mg/dL — AB (ref 0–99)
NonHDL: 141.57
TRIGLYCERIDES: 189 mg/dL — AB (ref 0.0–149.0)
VLDL: 37.8 mg/dL (ref 0.0–40.0)

## 2016-02-04 LAB — TSH: TSH: 1.88 u[IU]/mL (ref 0.35–4.50)

## 2016-02-04 LAB — PSA: PSA: 0.72 ng/mL (ref 0.10–4.00)

## 2016-02-04 MED ORDER — SILDENAFIL CITRATE 100 MG PO TABS: 100.0000 mg | ORAL_TABLET | Freq: Every day | ORAL | 11 refills | Status: DC | PRN

## 2016-02-04 MED ORDER — ACYCLOVIR 400 MG PO TABS: 400.0000 mg | ORAL_TABLET | Freq: Three times a day (TID) | ORAL | 3 refills | Status: DC | PRN

## 2016-02-04 MED ORDER — POTASSIUM CHLORIDE CRYS ER 20 MEQ PO TBCR: 20.0000 meq | EXTENDED_RELEASE_TABLET | Freq: Two times a day (BID) | ORAL | 3 refills | Status: DC

## 2016-02-04 MED ORDER — PITAVASTATIN CALCIUM 2 MG PO TABS: 1.0000 | ORAL_TABLET | Freq: Every day | ORAL | 3 refills | Status: DC

## 2016-02-04 MED ORDER — ATENOLOL-CHLORTHALIDONE 50-25 MG PO TABS: 1.0000 | ORAL_TABLET | Freq: Every day | ORAL | 3 refills | Status: DC

## 2016-02-04 NOTE — Patient Instructions (Signed)

## 2016-02-04 NOTE — Progress Notes (Signed)
Subjective:  Patient ID: Willie Foster, male    DOB: 1965-10-27  Age: 50 y.o. MRN: [id number]  CC: Hypertension and Annual Exam   HPI Willie Foster presents for a CPX.  He reports his blood pressure has been well controlled on the beta blocker and thiazide diuretic. He has had no recent episodes of headache/blurred vision/palpitations/chest pain/shortness of breath/edema/diaphoresis/or fatigue.  Outpatient Medications Prior to Visit  Medication Sig Dispense Refill  . Cetirizine HCl (ZYRTEC ALLERGY) 10 MG CAPS Take 10 mg by mouth daily.    . Cholecalciferol 2000 UNITS CAPS Take 1 capsule (2,000 Units total) by mouth daily. 30 each   . cyclobenzaprine (FLEXERIL) 10 MG tablet Take 1 tablet (10 mg total) by mouth 3 (three) times daily as needed for muscle spasms. 90 tablet 1  . Turmeric 500 MG CAPS Take 1 tablet by mouth 2 (two) times daily.    Alveda Reasons 20 MG TABS tablet TAKE ONE TABLET BY MOUTH ONCE DAILY WITH SUPPER 30 tablet 11  . acyclovir (ZOVIRAX) 400 MG tablet TAKE ONE TABLET BY MOUTH THREE TIMES DAILY AS NEEDED FOR  FEVER  BLISTERS 30 tablet 0  . atenolol-chlorthalidone (TENORETIC) 50-25 MG tablet Take 1 tablet by mouth daily. 90 tablet 1  . CRESTOR 20 MG tablet TAKE ONE TABLET BY MOUTH ONCE DAILY 30 tablet 11  . OVER THE COUNTER MEDICATION Take by mouth 2 (two) times daily. L-Carnaitine/amino acid-1048m   3 in am and one in pm    . Pitavastatin Calcium 2 MG TABS Take 1 tablet (2 mg total) by mouth daily. 30 tablet 11  . potassium chloride SA (KLOR-CON M20) 20 MEQ tablet Take 1 tablet (20 mEq total) by mouth 2 (two) times daily. 60 tablet 5   No facility-administered medications prior to visit.     ROS Review of Systems  Constitutional: Negative.  Negative for diaphoresis, fatigue and unexpected weight change.  HENT: Negative.   Eyes: Negative.  Negative for visual disturbance.  Respiratory: Negative.  Negative for cough, choking, chest tightness, shortness of breath,  wheezing and stridor.   Cardiovascular: Negative.  Negative for chest pain, palpitations and leg swelling.  Gastrointestinal: Negative.  Negative for abdominal pain, constipation, diarrhea and nausea.  Endocrine: Negative.   Genitourinary: Negative.  Negative for decreased urine volume, difficulty urinating, dysuria, flank pain, frequency, hematuria, penile pain, penile swelling, scrotal swelling and testicular pain.       He complains of erectile dysfunction  Musculoskeletal: Negative.  Negative for arthralgias, back pain, joint swelling, myalgias and neck pain.  Skin: Negative.   Allergic/Immunologic: Negative.   Neurological: Negative.  Negative for dizziness, tremors, weakness, light-headedness, numbness and headaches.  Hematological: Negative.  Negative for adenopathy. Does not bruise/bleed easily.  Psychiatric/Behavioral: Negative.     Objective:  BP 118/80 (BP Location: Left Arm, Patient Position: Sitting, Cuff Size: Normal)   Pulse 67   Temp 97.8 F (36.6 C) (Oral)   Resp 16   Ht 5' 9"  (1.753 m)   Wt 216 lb 4 oz (98.1 kg)   SpO2 95%   BMI 31.93 kg/m   BP Readings from Last 3 Encounters:  02/04/16 118/80  06/21/15 118/80  06/21/15 118/80    Wt Readings from Last 3 Encounters:  02/04/16 216 lb 4 oz (98.1 kg)  06/21/15 221 lb (100.2 kg)  06/21/15 221 lb (100.2 kg)    Physical Exam  Constitutional: He is oriented to person, place, and time. No distress.  HENT:  Head: Normocephalic and  atraumatic.  Mouth/Throat: Oropharynx is clear and moist. No oropharyngeal exudate.  Eyes: Conjunctivae are normal. Right eye exhibits no discharge. Left eye exhibits no discharge. No scleral icterus.  Neck: Normal range of motion. Neck supple. No JVD present. No tracheal deviation present. No thyromegaly present.  Cardiovascular: Normal rate, regular rhythm, normal heart sounds and intact distal pulses.  Exam reveals no gallop and no friction rub.   No murmur heard. Pulmonary/Chest:  Effort normal and breath sounds normal. No stridor. No respiratory distress. He has no wheezes. He has no rales. He exhibits no tenderness.  Abdominal: Soft. Bowel sounds are normal. He exhibits no distension and no mass. There is no tenderness. There is no rebound and no guarding. Hernia confirmed negative in the right inguinal area and confirmed negative in the left inguinal area.  Genitourinary: Rectum normal, prostate normal, testes normal and penis normal. Rectal exam shows no external hemorrhoid, no internal hemorrhoid, no fissure, no mass, no tenderness, anal tone normal and guaiac negative stool. Prostate is not enlarged and not tender. Right testis shows no mass, no swelling and no tenderness. Right testis is descended. Left testis shows no mass, no swelling and no tenderness. Left testis is descended. Uncircumcised. No phimosis, paraphimosis, hypospadias, penile erythema or penile tenderness. No discharge found.  Musculoskeletal: Normal range of motion. He exhibits no edema, tenderness or deformity.  Lymphadenopathy:    He has no cervical adenopathy.       Right: No inguinal adenopathy present.       Left: No inguinal adenopathy present.  Neurological: He is oriented to person, place, and time.  Skin: Skin is warm and dry. No rash noted. He is not diaphoretic. No erythema. No pallor.  Psychiatric: He has a normal mood and affect. His behavior is normal. Judgment and thought content normal.  Vitals reviewed.   Lab Results  Component Value Date   WBC 4.7 02/04/2016   HGB 14.1 02/04/2016   HCT 41.1 02/04/2016   PLT 169.0 02/04/2016   GLUCOSE 98 02/04/2016   CHOL 205 (H) 02/04/2016   TRIG 189.0 (H) 02/04/2016   HDL 63.60 02/04/2016   LDLCALC 104 (H) 02/04/2016   ALT 44 02/04/2016   AST 43 (H) 02/04/2016   NA 141 02/04/2016   K 3.3 (L) 02/04/2016   CL 102 02/04/2016   CREATININE 1.23 02/04/2016   BUN 18 02/04/2016   CO2 32 02/04/2016   TSH 1.88 02/04/2016   PSA 0.72 02/04/2016     INR 1.00 11/13/2012   HGBA1C 5.6 12/19/2014    Dg Chest 2 View  Result Date: 06/18/2014 CLINICAL DATA:  LEFT-sided chest pain since this morning. EXAM: CHEST  2 VIEW COMPARISON:  11/18/2012. FINDINGS: Cardiopericardial silhouette within normal limits. Mediastinal contours normal. Trachea midline. No airspace disease or effusion. IMPRESSION: No active cardiopulmonary disease. Electronically Signed   By: Dereck Ligas M.D.   On: 06/18/2014 20:33    Assessment & Plan:   Willie Foster was seen today for hypertension and annual exam.  Diagnoses and all orders for this visit:  Routine general medical examination at a health care facility- exam completed, labs ordered and reviewed, he was given a flu vaccine, colonoscopy is up-to-date, patient education material was given. -     Lipid panel; Future -     Comprehensive metabolic panel; Future -     CBC with Differential/Platelet; Future -     PSA; Future -     TSH; Future -     Urinalysis, Routine  w reflex microscopic (not at [facility]); Future  Essential hypertension, benign- his blood pressure is well-controlled, potassium level is slightly low as a result of the thiazide diuretics, will treat it -     atenolol-chlorthalidone (TENORETIC) 50-25 MG tablet; Take 1 tablet by mouth daily. -     potassium chloride SA (KLOR-CON M20) 20 MEQ tablet; Take 1 tablet (20 mEq total) by mouth 2 (two) times daily.  Diuretic-induced hypokalemia- fast him to restart potassium replacement therapy to treat this. -     potassium chloride SA (KLOR-CON M20) 20 MEQ tablet; Take 1 tablet (20 mEq total) by mouth 2 (two) times daily.  Hyperlipidemia with target LDL less than 130- he is achieved his LDL goal is doing well on the statin. -     Pitavastatin Calcium 2 MG TABS; Take 1 tablet (2 mg total) by mouth daily.  Need for prophylactic vaccination and inoculation against influenza -     Flu Vaccine QUAD 36+ mos IM  Drug-induced erectile dysfunction -     sildenafil  (VIAGRA) 100 MG tablet; Take 1 tablet (100 mg total) by mouth daily as needed for erectile dysfunction.  Hypokalemia  Other orders -     acyclovir (ZOVIRAX) 400 MG tablet; Take 1 tablet (400 mg total) by mouth 3 (three) times daily as needed (for fever blisters).   I have discontinued Willie Foster's OVER THE COUNTER MEDICATION and CRESTOR. I have also changed his acyclovir. Additionally, I am having him start on sildenafil. Lastly, I am having him maintain his Cetirizine HCl, Turmeric, Cholecalciferol, cyclobenzaprine, XARELTO, atenolol-chlorthalidone, potassium chloride SA, and Pitavastatin Calcium.  Meds ordered this encounter  Medications  . atenolol-chlorthalidone (TENORETIC) 50-25 MG tablet    Sig: Take 1 tablet by mouth daily.    Dispense:  90 tablet    Refill:  3  . potassium chloride SA (KLOR-CON M20) 20 MEQ tablet    Sig: Take 1 tablet (20 mEq total) by mouth 2 (two) times daily.    Dispense:  180 tablet    Refill:  3  . Pitavastatin Calcium 2 MG TABS    Sig: Take 1 tablet (2 mg total) by mouth daily.    Dispense:  90 tablet    Refill:  3  . acyclovir (ZOVIRAX) 400 MG tablet    Sig: Take 1 tablet (400 mg total) by mouth 3 (three) times daily as needed (for fever blisters).    Dispense:  30 tablet    Refill:  3  . sildenafil (VIAGRA) 100 MG tablet    Sig: Take 1 tablet (100 mg total) by mouth daily as needed for erectile dysfunction.    Dispense:  10 tablet    Refill:  11     Follow-up: Return in about 6 months (around 08/03/2016).  Scarlette Calico, MD

## 2016-02-04 NOTE — Progress Notes (Signed)
Pre visit review using our clinic review tool, if applicable. No additional management support is needed unless otherwise documented below in the visit note. 

## 2016-03-04 ENCOUNTER — Telehealth: Payer: Self-pay | Admitting: Internal Medicine

## 2016-03-04 NOTE — Telephone Encounter (Signed)
Pt called in and said that he would like you to call him when you get a chance, he has a few questions

## 2016-04-30 ENCOUNTER — Telehealth: Payer: Self-pay | Admitting: *Deleted

## 2016-04-30 ENCOUNTER — Other Ambulatory Visit: Payer: Self-pay | Admitting: Internal Medicine

## 2016-04-30 DIAGNOSIS — E785 Hyperlipidemia, unspecified: Secondary | ICD-10-CM

## 2016-04-30 MED ORDER — PRAVASTATIN SODIUM 40 MG PO TABS: 40.0000 mg | ORAL_TABLET | Freq: Every day | ORAL | 3 refills | Status: DC

## 2016-04-30 NOTE — Telephone Encounter (Signed)
Pt left msg on triage stating the copay for his Chanda Busing has gone from $18 a month to $75 a month. Can not afford $75 monthly pt requesting an alternative...Willie Foster

## 2016-04-30 NOTE — Telephone Encounter (Signed)
changed

## 2016-04-30 NOTE — Telephone Encounter (Signed)
Notified pt w/MD response rx sent to [place]...Johny Chess

## 2016-06-02 ENCOUNTER — Other Ambulatory Visit (INDEPENDENT_AMBULATORY_CARE_PROVIDER_SITE_OTHER): Payer: 59

## 2016-06-02 ENCOUNTER — Encounter: Payer: Self-pay | Admitting: Internal Medicine

## 2016-06-02 ENCOUNTER — Ambulatory Visit (INDEPENDENT_AMBULATORY_CARE_PROVIDER_SITE_OTHER): Payer: 59 | Admitting: Internal Medicine

## 2016-06-02 VITALS — BP 130/80 | HR 54 | Temp 97.8°F | Resp 16 | Ht 69.0 in | Wt 219.0 lb

## 2016-06-02 DIAGNOSIS — T502X5A Adverse effect of carbonic-anhydrase inhibitors, benzothiadiazides and other diuretics, initial encounter: Secondary | ICD-10-CM

## 2016-06-02 DIAGNOSIS — E876 Hypokalemia: Secondary | ICD-10-CM | POA: Diagnosis not present

## 2016-06-02 DIAGNOSIS — I10 Essential (primary) hypertension: Secondary | ICD-10-CM

## 2016-06-02 LAB — BASIC METABOLIC PANEL
BUN: 19 mg/dL (ref 6–23)
CHLORIDE: 101 meq/L (ref 96–112)
CO2: 31 meq/L (ref 19–32)
Calcium: 9.7 mg/dL (ref 8.4–10.5)
Creatinine, Ser: 1.66 mg/dL — ABNORMAL HIGH (ref 0.40–1.50)
GFR: 46.39 mL/min — AB (ref >60.00)
Glucose, Bld: 85 mg/dL (ref 70–99)
POTASSIUM: 3.5 meq/L (ref 3.5–5.1)
Sodium: 140 mEq/L (ref 135–145)

## 2016-06-02 LAB — MAGNESIUM: Magnesium: 1.9 mg/dL (ref 1.5–2.5)

## 2016-06-02 NOTE — Progress Notes (Signed)
Pre visit review using our clinic review tool, if applicable. No additional management support is needed unless otherwise documented below in the visit note. 

## 2016-06-02 NOTE — Patient Instructions (Signed)
Hypertension Hypertension, commonly called high blood pressure, is when the force of blood pumping through your arteries is too strong. Your arteries are the blood vessels that carry blood from your heart throughout your body. A blood pressure reading consists of a higher number over a lower number, such as 110/72. The higher number (systolic) is the pressure inside your arteries when your heart pumps. The lower number (diastolic) is the pressure inside your arteries when your heart relaxes. Ideally you want your blood pressure below 120/80. Hypertension forces your heart to work harder to pump blood. Your arteries may become narrow or stiff. Having untreated or uncontrolled hypertension can cause heart attack, stroke, kidney disease, and other problems. What increases the risk? Some risk factors for high blood pressure are controllable. Others are not. Risk factors you cannot control include:  Race. You may be at higher risk if you are African American.  Age. Risk increases with age.  Gender. Men are at higher risk than women before age 45 years. After age 65, women are at higher risk than men. Risk factors you can control include:  Not getting enough exercise or physical activity.  Being overweight.  Getting too much fat, sugar, calories, or salt in your diet.  Drinking too much alcohol. What are the signs or symptoms? Hypertension does not usually cause signs or symptoms. Extremely high blood pressure (hypertensive crisis) may cause headache, anxiety, shortness of breath, and nosebleed. How is this diagnosed? To check if you have hypertension, your health care provider will measure your blood pressure while you are seated, with your arm held at the level of your heart. It should be measured at least twice using the same arm. Certain conditions can cause a difference in blood pressure between your right and left arms. A blood pressure reading that is higher than normal on one occasion does  not mean that you need treatment. If it is not clear whether you have high blood pressure, you may be asked to return on a different day to have your blood pressure checked again. Or, you may be asked to monitor your blood pressure at home for 1 or more weeks. How is this treated? Treating high blood pressure includes making lifestyle changes and possibly taking medicine. Living a healthy lifestyle can help lower high blood pressure. You may need to change some of your habits. Lifestyle changes may include:  Following the DASH diet. This diet is high in fruits, vegetables, and whole grains. It is low in salt, red meat, and added sugars.  Keep your sodium intake below 2,300 mg per day.  Getting at least 30-45 minutes of aerobic exercise at least 4 times per week.  Losing weight if necessary.  Not smoking.  Limiting alcoholic beverages.  Learning ways to reduce stress. Your health care provider may prescribe medicine if lifestyle changes are not enough to get your blood pressure under control, and if one of the following is true:  You are 18-59 years of age and your systolic blood pressure is above 140.  You are 60 years of age or older, and your systolic blood pressure is above 150.  Your diastolic blood pressure is above 90.  You have diabetes, and your systolic blood pressure is over 140 or your diastolic blood pressure is over 90.  You have kidney disease and your blood pressure is above 140/90.  You have heart disease and your blood pressure is above 140/90. Your personal target blood pressure may vary depending on your medical   conditions, your age, and other factors. Follow these instructions at home:  Have your blood pressure rechecked as directed by your health care provider.  Take medicines only as directed by your health care provider. Follow the directions carefully. Blood pressure medicines must be taken as prescribed. The medicine does not work as well when you skip  doses. Skipping doses also puts you at risk for problems.  Do not smoke.  Monitor your blood pressure at home as directed by your health care provider. Contact a health care provider if:  You think you are having a reaction to medicines taken.  You have recurrent headaches or feel dizzy.  You have swelling in your ankles.  You have trouble with your vision. Get help right away if:  You develop a severe headache or confusion.  You have unusual weakness, numbness, or feel faint.  You have severe chest or abdominal pain.  You vomit repeatedly.  You have trouble breathing. This information is not intended to replace advice given to you by your health care provider. Make sure you discuss any questions you have with your health care provider. Document Released: 05/11/2005 Document Revised: 10/17/2015 Document Reviewed: 03/03/2013 Elsevier Interactive Patient Education  2017 Elsevier Inc.  

## 2016-06-02 NOTE — Progress Notes (Signed)
Subjective:  Patient ID: Willie Foster, male    DOB: [birth year]  Age: 51 y.o. MRN: [id number]  CC: Hypertension   HPI Willie Foster presents for a blood pressure check. He feels well and offers no complaints.  Outpatient Medications Prior to Visit  Medication Sig Dispense Refill  . acyclovir (ZOVIRAX) 400 MG tablet Take 1 tablet (400 mg total) by mouth 3 (three) times daily as needed (for fever blisters). 30 tablet 3  . atenolol-chlorthalidone (TENORETIC) 50-25 MG tablet Take 1 tablet by mouth daily. 90 tablet 3  . Cetirizine HCl (ZYRTEC ALLERGY) 10 MG CAPS Take 10 mg by mouth daily.    . Cholecalciferol 2000 UNITS CAPS Take 1 capsule (2,000 Units total) by mouth daily. 30 each   . cyclobenzaprine (FLEXERIL) 10 MG tablet Take 1 tablet (10 mg total) by mouth 3 (three) times daily as needed for muscle spasms. 90 tablet 1  . pravastatin (PRAVACHOL) 40 MG tablet Take 1 tablet (40 mg total) by mouth daily. 90 tablet 3  . sildenafil (VIAGRA) 100 MG tablet Take 1 tablet (100 mg total) by mouth daily as needed for erectile dysfunction. 10 tablet 11  . Turmeric 500 MG CAPS Take 1 tablet by mouth 2 (two) times daily.    Alveda Reasons 20 MG TABS tablet TAKE ONE TABLET BY MOUTH ONCE DAILY WITH SUPPER 30 tablet 11  . potassium chloride SA (KLOR-CON M20) 20 MEQ tablet Take 1 tablet (20 mEq total) by mouth 2 (two) times daily. 180 tablet 3   No facility-administered medications prior to visit.     ROS Review of Systems  Constitutional: Negative for activity change, appetite change, diaphoresis, fatigue and unexpected weight change.  HENT: Negative.   Eyes: Negative for visual disturbance.  Respiratory: Negative for cough, chest tightness, shortness of breath and stridor.   Cardiovascular: Negative.  Negative for chest pain, palpitations and leg swelling.  Gastrointestinal: Negative for abdominal pain, constipation, diarrhea, nausea and vomiting.  Endocrine: Negative.   Genitourinary: Negative.   Negative for difficulty urinating, dysuria, frequency and urgency.  Musculoskeletal: Negative.  Negative for back pain and neck pain.  Skin: Negative.   Allergic/Immunologic: Negative.   Neurological: Negative.  Negative for dizziness, weakness and headaches.  Hematological: Negative for adenopathy. Does not bruise/bleed easily.  Psychiatric/Behavioral: Negative.     Objective:  BP 130/80 (BP Location: Left Arm, Patient Position: Sitting, Cuff Size: Large)   Pulse (!) 54   Temp 97.8 F (36.6 C) (Oral)   Resp 16   Ht 5' 9"  (1.753 m)   Wt 219 lb (99.3 kg)   SpO2 97%   BMI 32.34 kg/m   BP Readings from Last 3 Encounters:  06/02/16 130/80  02/04/16 118/80  06/21/15 118/80    Wt Readings from Last 3 Encounters:  06/02/16 219 lb (99.3 kg)  02/04/16 216 lb 4 oz (98.1 kg)  06/21/15 221 lb (100.2 kg)    Physical Exam  Constitutional: He is oriented to person, place, and time. No distress.  HENT:  Mouth/Throat: Oropharynx is clear and moist. No oropharyngeal exudate.  Eyes: Conjunctivae are normal. Right eye exhibits no discharge. Left eye exhibits no discharge. No scleral icterus.  Neck: Normal range of motion. Neck supple. No JVD present. No tracheal deviation present. No thyromegaly present.  Cardiovascular: Normal rate, regular rhythm, normal heart sounds and intact distal pulses.  Exam reveals no gallop and no friction rub.   No murmur heard. Pulmonary/Chest: Effort normal and breath sounds normal. No stridor. No  respiratory distress. He has no wheezes. He has no rales. He exhibits no tenderness.  Abdominal: Soft. Bowel sounds are normal. He exhibits no distension and no mass. There is no tenderness. There is no rebound and no guarding.  Musculoskeletal: Normal range of motion. He exhibits no edema, tenderness or deformity.  Lymphadenopathy:    He has no cervical adenopathy.  Neurological: He is oriented to person, place, and time.  Skin: Skin is warm and dry. No rash noted.  He is not diaphoretic. No erythema. No pallor.  Vitals reviewed.   Lab Results  Component Value Date   WBC 4.7 02/04/2016   HGB 14.1 02/04/2016   HCT 41.1 02/04/2016   PLT 169.0 02/04/2016   GLUCOSE 85 06/02/2016   CHOL 205 (H) 02/04/2016   TRIG 189.0 (H) 02/04/2016   HDL 63.60 02/04/2016   LDLCALC 104 (H) 02/04/2016   ALT 44 02/04/2016   AST 43 (H) 02/04/2016   NA 140 06/02/2016   K 3.5 06/02/2016   CL 101 06/02/2016   CREATININE 1.66 (H) 06/02/2016   BUN 19 06/02/2016   CO2 31 06/02/2016   TSH 1.88 02/04/2016   PSA 0.72 02/04/2016   INR 1.00 11/13/2012   HGBA1C 5.6 12/19/2014    Dg Chest 2 View  Result Date: 06/18/2014 CLINICAL DATA:  LEFT-sided chest pain since this morning. EXAM: CHEST  2 VIEW COMPARISON:  11/18/2012. FINDINGS: Cardiopericardial silhouette within normal limits. Mediastinal contours normal. Trachea midline. No airspace disease or effusion. IMPRESSION: No active cardiopulmonary disease. Electronically Signed   By: Dereck Ligas M.D.   On: 06/18/2014 20:33    Assessment & Plan:   Willie Foster was seen today for hypertension.  Diagnoses and all orders for this visit:  Diuretic-induced hypokalemia- he is taking the potassium supplement twice a day but his potassium level is only 3.5 so I have asked him to increase the potassium dose to 3 times a day. -     Magnesium; Future -     Basic metabolic panel; Future -     potassium chloride SA (KLOR-CON M20) 20 MEQ tablet; Take 1 tablet (20 mEq total) by mouth 3 (three) times daily.  Essential hypertension, benign- his blood pressure is adequately well controlled, his potassium level is slightly low at 3.5 so I will increase his dose of the potassium supplement, also his creatinine is slightly elevated but his calculated creatinine clearance is normal at 60 think the elevated creatinine is related to his overall muscle mass and not an indication of declining renal function. -     Magnesium; Future -     Basic  metabolic panel; Future -     potassium chloride SA (KLOR-CON M20) 20 MEQ tablet; Take 1 tablet (20 mEq total) by mouth 3 (three) times daily.   I have changed Willie Foster's potassium chloride SA. I am also having him maintain his Cetirizine HCl, Turmeric, Cholecalciferol, cyclobenzaprine, XARELTO, atenolol-chlorthalidone, acyclovir, sildenafil, and pravastatin.  Meds ordered this encounter  Medications  . potassium chloride SA (KLOR-CON M20) 20 MEQ tablet    Sig: Take 1 tablet (20 mEq total) by mouth 3 (three) times daily.    Dispense:  270 tablet    Refill:  1     Follow-up: Return in about 6 months (around 11/30/2016).  Scarlette Calico, MD

## 2016-06-03 ENCOUNTER — Encounter: Payer: Self-pay | Admitting: Internal Medicine

## 2016-06-03 MED ORDER — POTASSIUM CHLORIDE CRYS ER 20 MEQ PO TBCR: 20.0000 meq | EXTENDED_RELEASE_TABLET | Freq: Three times a day (TID) | ORAL | 1 refills | Status: DC

## 2016-06-09 ENCOUNTER — Encounter: Payer: 59 | Admitting: Oncology

## 2016-06-15 ENCOUNTER — Ambulatory Visit (INDEPENDENT_AMBULATORY_CARE_PROVIDER_SITE_OTHER): Payer: 59 | Admitting: Oncology

## 2016-06-15 ENCOUNTER — Encounter: Payer: Self-pay | Admitting: Oncology

## 2016-06-15 VITALS — BP 134/75 | HR 56 | Temp 97.9°F | Ht 69.0 in | Wt 220.0 lb

## 2016-06-15 DIAGNOSIS — R7989 Other specified abnormal findings of blood chemistry: Secondary | ICD-10-CM

## 2016-06-15 DIAGNOSIS — I10 Essential (primary) hypertension: Secondary | ICD-10-CM | POA: Diagnosis not present

## 2016-06-15 DIAGNOSIS — Z7901 Long term (current) use of anticoagulants: Secondary | ICD-10-CM

## 2016-06-15 DIAGNOSIS — Z801 Family history of malignant neoplasm of trachea, bronchus and lung: Secondary | ICD-10-CM

## 2016-06-15 DIAGNOSIS — I2782 Chronic pulmonary embolism: Secondary | ICD-10-CM | POA: Diagnosis not present

## 2016-06-15 DIAGNOSIS — Z79899 Other long term (current) drug therapy: Secondary | ICD-10-CM

## 2016-06-15 DIAGNOSIS — Z803 Family history of malignant neoplasm of breast: Secondary | ICD-10-CM

## 2016-06-15 DIAGNOSIS — Z8249 Family history of ischemic heart disease and other diseases of the circulatory system: Secondary | ICD-10-CM

## 2016-06-15 DIAGNOSIS — Z888 Allergy status to other drugs, medicaments and biological substances status: Secondary | ICD-10-CM

## 2016-06-15 HISTORY — DX: Other specified abnormal findings of blood chemistry: R79.89

## 2016-06-15 MED ORDER — RIVAROXABAN 10 MG PO TABS: 10.0000 mg | ORAL_TABLET | Freq: Every day | ORAL | 11 refills | Status: DC

## 2016-06-15 NOTE — Patient Instructions (Signed)
To lab today Return visit 6 months no lab

## 2016-06-16 ENCOUNTER — Encounter: Payer: 59 | Admitting: Oncology

## 2016-06-16 LAB — CBC WITH DIFFERENTIAL/PLATELET
BASOS ABS: 0 10*3/uL (ref 0.0–0.2)
BASOS: 0 %
EOS (ABSOLUTE): 0.1 10*3/uL (ref 0.0–0.4)
Eos: 1 %
Hematocrit: 40.2 % (ref 37.5–51.0)
Hemoglobin: 13.5 g/dL (ref 13.0–17.7)
IMMATURE GRANS (ABS): 0 10*3/uL (ref 0.0–0.1)
Immature Granulocytes: 0 %
Lymphocytes Absolute: 1.7 10*3/uL (ref 0.7–3.1)
Lymphs: 46 %
MCH: 26 pg — AB (ref 26.6–33.0)
MCHC: 33.6 g/dL (ref 31.5–35.7)
MCV: 77 fL — AB (ref 79–97)
MONOS ABS: 0.4 10*3/uL (ref 0.1–0.9)
Monocytes: 11 %
NEUTROS ABS: 1.5 10*3/uL (ref 1.4–7.0)
Neutrophils: 42 %
PLATELETS: 172 10*3/uL (ref 150–379)
RBC: 5.2 x10E6/uL (ref 4.14–5.80)
RDW: 15.3 % (ref 12.3–15.4)
WBC: 3.7 10*3/uL (ref 3.4–10.8)

## 2016-06-16 LAB — COMPREHENSIVE METABOLIC PANEL
A/G RATIO: 1.8 (ref 1.2–2.2)
ALK PHOS: 66 IU/L (ref 39–117)
ALT: 40 IU/L (ref 0–44)
AST: 38 IU/L (ref 0–40)
Albumin: 4.4 g/dL (ref 3.5–5.5)
BUN / CREAT RATIO: 11 (ref 9–20)
BUN: 19 mg/dL (ref 6–24)
Bilirubin Total: 0.3 mg/dL (ref 0.0–1.2)
CHLORIDE: 99 mmol/L (ref 96–106)
CO2: 27 mmol/L (ref 18–29)
Calcium: 9.5 mg/dL (ref 8.7–10.2)
Creatinine, Ser: 1.69 mg/dL — ABNORMAL HIGH (ref 0.76–1.27)
GFR calc non Af Amer: 46 mL/min/{1.73_m2} — ABNORMAL LOW (ref >59)
GFR, EST AFRICAN AMERICAN: 53 mL/min/{1.73_m2} — AB (ref >59)
GLUCOSE: 86 mg/dL (ref 65–99)
Globulin, Total: 2.5 g/dL (ref 1.5–4.5)
POTASSIUM: 3.6 mmol/L (ref 3.5–5.2)
Sodium: 142 mmol/L (ref 134–144)
TOTAL PROTEIN: 6.9 g/dL (ref 6.0–8.5)

## 2016-06-17 NOTE — Progress Notes (Signed)
Hematology and Oncology Follow Up Visit  Willie Foster [id number] 08/27/1965 51 y.o. 06/17/2016 11:49 AM   Principle Diagnosis: Encounter Diagnoses  Name Primary?  . Other chronic pulmonary embolism without acute cor pulmonale (HCC) Yes  . Elevated serum creatinine   Clinical summary: 51 year old man in overall excellent health without any major medical or surgical illness. He started to develop vague, intermittent, right-sided chest pain which occurred on and off during the month of May, 2014. He had a worsening of symptoms and presented to a local urgent care center where he was advised to go to the emergency department. CT scan angiogram of the chest done on June 22 showed pulmonary emboli in the right middle and right lower lobes. He was admitted to the hospital and initially started on unfractionated heparin and then changed to Xarelto. His symptoms completely resolved. Venous Doppler studies of his lower extremities done on November 14, 2012 negative for clot.  CXR on admission June 22 no mass or infiltrate CT angiogram other than the pulmonary emboli showed no mass or adenopathy. He had no recent prolonged travel, no infection, no immobilization; no surgery. He had no constitutional symptoms. He has no signs or symptoms of a collagen vascular disorder. He admits that on rare occasions when he strains at stools he sees a small amount of blood on the toilet tissue. A subsequent   colonoscopy was normal 05/03/13 except for internal hemorrhoids which were sclerosed at time of the procedure. There is a strong family history of cancer. His mother died of complications of breast cancer metastatic to brain. Father died of lung cancer. He has a 50 year old sister who has triplets. No one in the family with history of blood clots. He lost a child at age 28 with a ruptured cerebral aneurysm. He has no other children. He is single at this time but has a steady girlfriend. He has no HIV risk factors and  specifically denies use of recreational drugs, male sex, or previous blood transfusions.  Special hematology studies: Negative for the presence of the factor V Leiden and the prothrombin gene mutations Normal functional protein S, free protein S, and protein C activity Normal antithrombin III Low positive anticardiolipin antibody IgG 31 units but negative beta-2 glycoprotein 1 antibodies including IgG subclass. Positive lupus anticoagulant likely spurious due to the use of Xarelto with a negative hexagonal phospholipid screen. Serum immunoglobulins normal and no monoclonal proteins on immunofixation electrophoresis.  Interim History:   He is doing well. 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 He denies any chest pain, dyspnea, palpitations, or leg swelling. No other interim medical problems. He remains quite active. Still working out. The only thing I noticed on review of his lab done on 06/02/2016 was a rise in his creatinine from previous baseline of 1.2 in September 2017 up to 1.7. Normal BUN. Although he is very muscular and this may just be normal, I repeated the creatinine today and it is still elevated at 1.7. His blood pressure has been under excellent control on atenolol/chlorthalidone and is 134/75 today.  Medications: reviewed  Allergies:  Allergies  Allergen Reactions  . Lipitor [Atorvastatin]     Elevated LFT's    Review of Systems: See interim history Remaining ROS negative:   Physical Exam: Blood pressure 134/75, pulse (!) 56, temperature 97.9 F (36.6 C), temperature source Oral, height 5' 9"  (1.753 m), weight 220 lb (99.8 kg), SpO2 99 %. Wt Readings from Last 3 Encounters:  06/15/16 220 lb (99.8  kg)  06/02/16 219 lb (99.3 kg)  02/04/16 216 lb 4 oz (98.1 kg)     General appearance: Muscular, healthy appearing, African-American man HENNT: Pharynx no erythema, exudate, mass, or ulcer. No thyromegaly or thyroid nodules Lymph nodes: No cervical,  supraclavicular, or axillary lymphadenopathy Breasts:  Lungs: Clear to auscultation, resonant to percussion throughout Heart: Regular rhythm, no murmur, no gallop, no rub, no click, no edema Abdomen: Soft, nontender, normal bowel sounds, no mass, no organomegaly Extremities: No edema, no calf tenderness Musculoskeletal: no joint deformities GU:  Vascular: Carotid pulses 2+, Neurologic: Alert, oriented, PERRLA, optic discs sharp and vessels normal, no hemorrhage or exudate, cranial nerves grossly normal, motor strength 5 over 5, reflexes 1+ symmetric, upper body coordination normal, gait normal, Skin: No rash or ecchymosis  Lab Results: CBC W/Diff    Component Value Date/Time   WBC 3.7 06/15/2016 1535   WBC 4.7 02/04/2016 0939   RBC 5.20 06/15/2016 1535   RBC 5.36 02/04/2016 0939   HGB 14.1 02/04/2016 0939   HGB 13.3 05/12/2013 1536   HCT 40.2 06/15/2016 1535   HCT 41.9 05/12/2013 1536   PLT 172 06/15/2016 1535   MCV 77 (L) 06/15/2016 1535   MCV 78.2 (L) 05/12/2013 1536   MCH 26.0 (L) 06/15/2016 1535   MCH 26.5 01/02/2014 0914   MCHC 33.6 06/15/2016 1535   MCHC 34.4 02/04/2016 0939   RDW 15.3 06/15/2016 1535   RDW 14.0 05/12/2013 1536   LYMPHSABS 1.7 06/15/2016 1535   LYMPHSABS 1.8 05/12/2013 1536   MONOABS 0.4 02/04/2016 0939   MONOABS 0.5 05/12/2013 1536   EOSABS 0.1 06/15/2016 1535   BASOSABS 0.0 06/15/2016 1535   BASOSABS 0.0 05/12/2013 1536     Chemistry      Component Value Date/Time   NA 142 06/15/2016 1535   NA 141 01/18/2013 1158   K 3.6 06/15/2016 1535   K 3.6 01/18/2013 1158   CL 99 06/15/2016 1535   CO2 27 06/15/2016 1535   CO2 25 01/18/2013 1158   BUN 19 06/15/2016 1535   BUN 12.5 01/18/2013 1158   CREATININE 1.69 (H) 06/15/2016 1535   CREATININE 1.17 01/02/2014 0914   CREATININE 1.0 01/18/2013 1158      Component Value Date/Time   CALCIUM 9.5 06/15/2016 1535   CALCIUM 9.8 01/18/2013 1158   ALKPHOS 66 06/15/2016 1535   ALKPHOS 63 01/18/2013  1158   AST 38 06/15/2016 1535   AST 30 01/18/2013 1158   ALT 40 06/15/2016 1535   ALT 45 01/18/2013 1158   BILITOT 0.3 06/15/2016 1535   BILITOT 0.47 01/18/2013 1158       Radiological Studies: No results found.  Impression:  #1. Idiopathic unprovoked pulmonary emboli He remains free of any recurrent thrombotic events on full dose Xarelto almost 4 years from diagnosis in June 2014. I reviewed with him the results of 2 studies published in the [employer] looking at reduced dose anticoagulation after an interval of full dose anticoagulation. The first study published in 2013 looked at [place] full dose versus half dose versus placebo. The second study published in March 2017 looked at for dose Xarelto versus half dose versus aspirin 100 mg. Patient's were selected based on the clinicians'uncertainty as to whether they needed to continue on long-term anticoagulation after 6-12 months of full anticoagulation. Very high risk patients were excluded from both studies. Both of these studies had anywhere between 60-90% of patients with unprovoked initial events. Both study showed equivalent  protection against rethrombosis at 50% dose reduction compared with full dose. Both study showed unacceptable rethrombosis rate with either placebo or low-dose aspirin.  In view of the patient's heavy physical activity, ongoing bleeding risk has to be balanced against risk of recurrent clotting. We mutually agreed to decrease his Xarelto to 10 mg daily. I will see him again in 6 months to see how he is doing. We reviewed signs and symptoms of recurrent PE or DVT and he is advised to call if he develops any of these symptoms.  #2. Unexplained rise in creatinine I will call him with the results of today's labs. I told him that if the creatinine was reproducibly elevated, we should pursue a more detailed evaluation of his kidney function with a formal 24 hour urine creatinine clearance.  #3.  Essential hypertension Currently well controlled on Tenoretic. It does not appear that his decline in renal function is due to the diuretic component of this drug since his BUN is normal.   CC: Patient Care Team: [name], MD as PCP - General (Internal Medicine)   Annia Belt, MD 1/24/201811:49 AM

## 2016-06-18 ENCOUNTER — Other Ambulatory Visit: Payer: Self-pay | Admitting: Oncology

## 2016-06-18 DIAGNOSIS — R7989 Other specified abnormal findings of blood chemistry: Secondary | ICD-10-CM

## 2016-06-24 ENCOUNTER — Other Ambulatory Visit: Payer: 59

## 2016-07-06 ENCOUNTER — Other Ambulatory Visit (INDEPENDENT_AMBULATORY_CARE_PROVIDER_SITE_OTHER): Payer: 59

## 2016-07-06 DIAGNOSIS — R7989 Other specified abnormal findings of blood chemistry: Secondary | ICD-10-CM | POA: Diagnosis not present

## 2016-07-07 LAB — BASIC METABOLIC PANEL
BUN / CREAT RATIO: 15 (ref 9–20)
BUN: 17 mg/dL (ref 6–24)
CALCIUM: 9.8 mg/dL (ref 8.7–10.2)
CHLORIDE: 99 mmol/L (ref 96–106)
CO2: 25 mmol/L (ref 18–29)
Creatinine, Ser: 1.11 mg/dL (ref 0.76–1.27)
GFR calc non Af Amer: 76 mL/min/{1.73_m2} (ref >59)
GFR, EST AFRICAN AMERICAN: 88 mL/min/{1.73_m2} (ref >59)
Glucose: 84 mg/dL (ref 65–99)
POTASSIUM: 3.7 mmol/L (ref 3.5–5.2)
SODIUM: 142 mmol/L (ref 134–144)

## 2016-07-09 ENCOUNTER — Telehealth: Payer: Self-pay | Admitting: *Deleted

## 2016-07-09 LAB — CREATININE CLEARANCE, URINE, 24 HOUR
CREATININE, UR: 141.2 mg/dL
Creatinine Clearance: 110 mL/min (ref 97–137)
Creatinine, 24H Ur: 2047 mg/24 hr — ABNORMAL HIGH (ref 1000–2000)
Creatinine, Ser: 1.29 mg/dL — ABNORMAL HIGH (ref 0.76–1.27)
GFR calc Af Amer: 73 mL/min/{1.73_m2} (ref >59)
GFR, EST NON AFRICAN AMERICAN: 63 mL/min/{1.73_m2} (ref >59)

## 2016-07-09 NOTE — Telephone Encounter (Signed)
-----   Message from Annia Belt, MD sent at 07/08/2016  1:58 PM EST ----- Call pt: creat clearance shows normal kidney function at 110 ml/min. Have a nice day.

## 2016-07-09 NOTE — Telephone Encounter (Signed)
Pt called / informed "creat clearance shows normal kidney function at 110 ml/min. Have a nice day." per Dr Beryle Beams. Pt stated thank you.

## 2016-11-23 ENCOUNTER — Other Ambulatory Visit: Payer: Self-pay | Admitting: Internal Medicine

## 2016-11-23 DIAGNOSIS — I10 Essential (primary) hypertension: Secondary | ICD-10-CM

## 2016-11-23 MED ORDER — CHLORTHALIDONE 25 MG PO TABS: 25.0000 mg | ORAL_TABLET | Freq: Every day | ORAL | 1 refills | Status: DC

## 2016-11-23 MED ORDER — ATENOLOL 50 MG PO TABS: 50.0000 mg | ORAL_TABLET | Freq: Every day | ORAL | 1 refills | Status: DC

## 2016-12-23 ENCOUNTER — Encounter: Payer: Self-pay | Admitting: Internal Medicine

## 2016-12-23 ENCOUNTER — Ambulatory Visit (INDEPENDENT_AMBULATORY_CARE_PROVIDER_SITE_OTHER): Payer: 59 | Admitting: Internal Medicine

## 2016-12-23 VITALS — BP 132/84 | HR 62 | Ht 69.0 in | Wt 215.0 lb

## 2016-12-23 DIAGNOSIS — I10 Essential (primary) hypertension: Secondary | ICD-10-CM | POA: Diagnosis not present

## 2016-12-23 DIAGNOSIS — H9121 Sudden idiopathic hearing loss, right ear: Secondary | ICD-10-CM | POA: Diagnosis not present

## 2016-12-23 DIAGNOSIS — J029 Acute pharyngitis, unspecified: Secondary | ICD-10-CM

## 2016-12-23 MED ORDER — AZITHROMYCIN 250 MG PO TABS: ORAL_TABLET | ORAL | 1 refills | Status: DC

## 2016-12-23 NOTE — Progress Notes (Signed)
Subjective:    Patient ID: Willie Foster, male    DOB: Nov 11, 1965, 51 y.o.   MRN: [id number]  HPI   Here with 2-3 days acute onset fever, severe ST pain, pressure, headache, general weakness and malaise, but pt denies chest pain, wheezing, increased sob or doe, orthopnea, PND, increased LE swelling, palpitations, dizziness or syncope.  Nephew has strep throat on antibiotics.  Also with reduced hearing right ear -? Wax impaction Past Medical History:  Diagnosis Date  . Elevated serum creatinine 06/15/2016   1.66 06/02/16  . Fatty liver disease, nonalcoholic 1093  . Hyperlipidemia   . Hypertension   . Leukopenia 01/18/2013   WBC 3,400 01/18/13 was normal 8/22 Xarelto?  . Pulmonary embolism ([place]) 10/2012   Right lung   No past surgical history on file.  reports that he has never smoked. He has never used smokeless tobacco. He reports that he drinks alcohol. He reports that he does not use drugs. family history includes Breast cancer in his mother; Hypertension in his father; Lung cancer in his father. Allergies  Allergen Reactions  . Lipitor [Atorvastatin]     Elevated LFT's   Current Outpatient Prescriptions on File Prior to Visit  Medication Sig Dispense Refill  . acyclovir (ZOVIRAX) 400 MG tablet Take 1 tablet (400 mg total) by mouth 3 (three) times daily as needed (for fever blisters). 30 tablet 3  . atenolol (TENORMIN) 50 MG tablet Take 1 tablet (50 mg total) by mouth daily. 90 tablet 1  . Cetirizine HCl (ZYRTEC ALLERGY) 10 MG CAPS Take 10 mg by mouth daily.    . chlorthalidone (HYGROTON) 25 MG tablet Take 1 tablet (25 mg total) by mouth daily. 90 tablet 1  . Cholecalciferol 2000 UNITS CAPS Take 1 capsule (2,000 Units total) by mouth daily. 30 each   . cyclobenzaprine (FLEXERIL) 10 MG tablet Take 1 tablet (10 mg total) by mouth 3 (three) times daily as needed for muscle spasms. 90 tablet 1  . potassium chloride SA (KLOR-CON M20) 20 MEQ tablet Take 1 tablet (20 mEq total) by mouth 3  (three) times daily. 270 tablet 1  . pravastatin (PRAVACHOL) 40 MG tablet Take 1 tablet (40 mg total) by mouth daily. 90 tablet 3  . rivaroxaban (XARELTO) 10 MG TABS tablet Take 1 tablet (10 mg total) by mouth daily. 30 tablet 11  . sildenafil (VIAGRA) 100 MG tablet Take 1 tablet (100 mg total) by mouth daily as needed for erectile dysfunction. 10 tablet 11  . Turmeric 500 MG CAPS Take 1 tablet by mouth 2 (two) times daily.     No current facility-administered medications on file prior to visit.    Review of Systems  Constitutional: Negative for other unusual diaphoresis or sweats HENT: Negative for ear discharge or swelling Eyes: Negative for other worsening visual disturbances Respiratory: Negative for stridor or other swelling  Gastrointestinal: Negative for worsening distension or other blood Genitourinary: Negative for retention or other urinary change Musculoskeletal: Negative for other MSK pain or swelling Skin: Negative for color change or other new lesions Neurological: Negative for worsening tremors and other numbness  Psychiatric/Behavioral: Negative for worsening agitation or other fatigue All other system neg per pt    Objective:   Physical Exam BP 132/84   Pulse 62   Ht 5' 9"  (1.753 m)   Wt 215 lb (97.5 kg)   SpO2 99%   BMI 31.75 kg/m  VS noted,  Constitutional: Pt appears in NAD HENT: Head: NCAT.  Right  Ear: External ear normal. Right ear with wax impaction resolved with irrigation and hearing improved  Left Ear: External ear normal.  Eyes: . Pupils are equal, round, and reactive to light. Conjunctivae and EOM are normal Nose: without d/c or deformity Bilat tm's with mild erythema.  Max sinus areas non tender.  Pharynx with se vere erythema with exudate Neck: Neck supple. Gross normal ROM, + bilat tender submandibular LA Cardiovascular: Normal rate and regular rhythm.   Pulmonary/Chest: Effort normal and breath sounds without rales or wheezing.  Neurological:  Pt is alert. At baseline orientation, motor grossly intact Skin: Skin is warm. No rashes, other new lesions, no LE edema Psychiatric: Pt behavior is normal without agitation  No other exam findings    Assessment & Plan:

## 2016-12-23 NOTE — Patient Instructions (Signed)
Your right ear was irrigated of wax today  Please take all new medication as prescribed - the antibiotic  Please continue all other medications as before, and refills have been done if requested.  Please have the pharmacy call with any other refills you may need.  Please keep your appointments with your specialists as you may have planned

## 2016-12-25 NOTE — Assessment & Plan Note (Signed)
Resolved with right ear wax impaction and hearing improved

## 2016-12-25 NOTE — Assessment & Plan Note (Signed)
Mild to mod, for antibx course,  to f/u any worsening symptoms or concerns 

## 2016-12-25 NOTE — Assessment & Plan Note (Signed)
stable overall by history and exam, recent data reviewed with pt, and pt to continue medical treatment as before,  to f/u any worsening symptoms or concerns BP Readings from Last 3 Encounters:  12/23/16 132/84  06/15/16 134/75  06/02/16 130/80

## 2017-01-12 ENCOUNTER — Encounter: Payer: Self-pay | Admitting: Internal Medicine

## 2017-01-12 ENCOUNTER — Ambulatory Visit (INDEPENDENT_AMBULATORY_CARE_PROVIDER_SITE_OTHER): Payer: 59 | Admitting: Internal Medicine

## 2017-01-12 ENCOUNTER — Ambulatory Visit (INDEPENDENT_AMBULATORY_CARE_PROVIDER_SITE_OTHER)
Admission: RE | Admit: 2017-01-12 | Discharge: 2017-01-12 | Disposition: A | Discharge status: Home / Self Care | Payer: 59 | Source: Ambulatory Visit | Attending: Internal Medicine | Admitting: Internal Medicine

## 2017-01-12 VITALS — BP 130/80 | HR 58 | Temp 98.5°F | Resp 16 | Ht 69.0 in | Wt 220.0 lb

## 2017-01-12 DIAGNOSIS — R05 Cough: Secondary | ICD-10-CM

## 2017-01-12 DIAGNOSIS — R059 Cough, unspecified: Secondary | ICD-10-CM

## 2017-01-12 DIAGNOSIS — R051 Acute cough: Secondary | ICD-10-CM | POA: Insufficient documentation

## 2017-01-12 DIAGNOSIS — J301 Allergic rhinitis due to pollen: Secondary | ICD-10-CM

## 2017-01-12 LAB — POCT EXHALED NITRIC OXIDE: FENO LEVEL (PPB): 12

## 2017-01-12 MED ORDER — METHYLPREDNISOLONE 4 MG PO TBPK
ORAL_TABLET | ORAL | 0 refills | Status: DC
Start: 2017-01-12 — End: 2017-02-04

## 2017-01-12 NOTE — Patient Instructions (Signed)
Cough, Adult Coughing is a reflex that clears your throat and your airways. Coughing helps to heal and protect your lungs. It is normal to cough occasionally, but a cough that happens with other symptoms or lasts a long time may be a sign of a condition that needs treatment. A cough may last only 2-3 weeks (acute), or it may last longer than 8 weeks (chronic). What are the causes? Coughing is commonly caused by:  Breathing in substances that irritate your lungs.  A viral or bacterial respiratory infection.  Allergies.  Asthma.  Postnasal drip.  Smoking.  Acid backing up from the stomach into the esophagus (gastroesophageal reflux).  Certain medicines.  Chronic lung problems, including COPD (or rarely, lung cancer).  Other medical conditions such as heart failure.  Follow these instructions at home: Pay attention to any changes in your symptoms. Take these actions to help with your discomfort:  Take medicines only as told by your health care provider. ? If you were prescribed an antibiotic medicine, take it as told by your health care provider. Do not stop taking the antibiotic even if you start to feel better. ? Talk with your health care provider before you take a cough suppressant medicine.  Drink enough fluid to keep your urine clear or pale yellow.  If the air is dry, use a cold steam vaporizer or humidifier in your bedroom or your home to help loosen secretions.  Avoid anything that causes you to cough at work or at home.  If your cough is worse at night, try sleeping in a semi-upright position.  Avoid cigarette smoke. If you smoke, quit smoking. If you need help quitting, ask your health care provider.  Avoid caffeine.  Avoid alcohol.  Rest as needed.  Contact a health care provider if:  You have new symptoms.  You cough up pus.  Your cough does not get better after 2-3 weeks, or your cough gets worse.  You cannot control your cough with suppressant  medicines and you are losing sleep.  You develop pain that is getting worse or pain that is not controlled with pain medicines.  You have a fever.  You have unexplained weight loss.  You have night sweats. Get help right away if:  You cough up blood.  You have difficulty breathing.  Your heartbeat is very fast. This information is not intended to replace advice given to you by your health care provider. Make sure you discuss any questions you have with your health care provider. Document Released: 11/07/2010 Document Revised: 10/17/2015 Document Reviewed: 07/18/2014 Elsevier Interactive Patient Education  2017 [employer] Inc[employer]  

## 2017-01-12 NOTE — Progress Notes (Signed)
Subjective:  Patient ID: Willie Foster, male    DOB: Jan 01, 1966  Age: 51 y.o. MRN: [id number]  CC: URI   HPI Willie Foster presents for a 3 week hx of runny nose, tickling and scratchy sensation in his throat, sneezing, cough productive of white mucus, white nasal mucus, and fatigue. He has taken Benadryl which provided some relief. He was seen about 3 weeks ago by someone else and was prescribed a course of Zithromax which has not helped.  Outpatient Medications Prior to Visit  Medication Sig Dispense Refill  . acyclovir (ZOVIRAX) 400 MG tablet Take 1 tablet (400 mg total) by mouth 3 (three) times daily as needed (for fever blisters). 30 tablet 3  . atenolol (TENORMIN) 50 MG tablet Take 1 tablet (50 mg total) by mouth daily. 90 tablet 1  . Cetirizine HCl (ZYRTEC ALLERGY) 10 MG CAPS Take 10 mg by mouth daily.    . chlorthalidone (HYGROTON) 25 MG tablet Take 1 tablet (25 mg total) by mouth daily. 90 tablet 1  . Cholecalciferol 2000 UNITS CAPS Take 1 capsule (2,000 Units total) by mouth daily. 30 each   . cyclobenzaprine (FLEXERIL) 10 MG tablet Take 1 tablet (10 mg total) by mouth 3 (three) times daily as needed for muscle spasms. 90 tablet 1  . potassium chloride SA (KLOR-CON M20) 20 MEQ tablet Take 1 tablet (20 mEq total) by mouth 3 (three) times daily. 270 tablet 1  . pravastatin (PRAVACHOL) 40 MG tablet Take 1 tablet (40 mg total) by mouth daily. 90 tablet 3  . rivaroxaban (XARELTO) 10 MG TABS tablet Take 1 tablet (10 mg total) by mouth daily. 30 tablet 11  . sildenafil (VIAGRA) 100 MG tablet Take 1 tablet (100 mg total) by mouth daily as needed for erectile dysfunction. 10 tablet 11  . Turmeric 500 MG CAPS Take 1 tablet by mouth 2 (two) times daily.    Marland Kitchen azithromycin (ZITHROMAX Z-PAK) 250 MG tablet 2 tab by mouth day 1, then 1 per day 6 tablet 1   No facility-administered medications prior to visit.     ROS Review of Systems  Constitutional: Positive for fatigue. Negative for  appetite change, chills, diaphoresis and fever.  HENT: Positive for congestion, postnasal drip, rhinorrhea and sneezing. Negative for facial swelling, sinus pain, sinus pressure, sore throat, trouble swallowing and voice change.   Eyes: Negative.   Respiratory: Positive for cough. Negative for chest tightness, shortness of breath and wheezing.   Cardiovascular: Negative for chest pain, palpitations and leg swelling.  Gastrointestinal: Negative for abdominal pain, constipation, diarrhea, nausea and vomiting.  Endocrine: Negative.   Genitourinary: Negative.  Negative for difficulty urinating.  Musculoskeletal: Negative.   Skin: Negative.  Negative for color change and rash.  Allergic/Immunologic: Negative.   Neurological: Negative.  Negative for dizziness, weakness and light-headedness.  Hematological: Negative for adenopathy. Does not bruise/bleed easily.  Psychiatric/Behavioral: Negative.     Objective:  BP 130/80 (BP Location: Left Arm, Patient Position: Sitting, Cuff Size: Normal)   Pulse (!) 58   Temp 98.5 F (36.9 C) (Oral)   Resp 16   Ht 5' 9"  (1.753 m)   Wt 220 lb (99.8 kg)   SpO2 98%   BMI 32.49 kg/m   BP Readings from Last 3 Encounters:  01/12/17 130/80  12/23/16 132/84  06/15/16 134/75    Wt Readings from Last 3 Encounters:  01/12/17 220 lb (99.8 kg)  12/23/16 215 lb (97.5 kg)  06/15/16 220 lb (99.8 kg)  Physical Exam  Constitutional: He is oriented to person, place, and time. No distress.  HENT:  Nose: Mucosal edema and rhinorrhea present. No sinus tenderness. No epistaxis.  No foreign bodies. Right sinus exhibits no maxillary sinus tenderness and no frontal sinus tenderness. Left sinus exhibits no maxillary sinus tenderness and no frontal sinus tenderness.  Mouth/Throat: Oropharynx is clear and moist. No oropharyngeal exudate.  Eyes: Conjunctivae are normal. Right eye exhibits no discharge. Left eye exhibits no discharge. No scleral icterus.  Neck: Normal  range of motion. Neck supple. No JVD present. No tracheal deviation present. No thyromegaly present.  Cardiovascular: Normal rate, regular rhythm and intact distal pulses.  Exam reveals no gallop and no friction rub.   No murmur heard. Pulmonary/Chest: Effort normal and breath sounds normal. No stridor. No respiratory distress. He has no wheezes. He has no rales. He exhibits no tenderness.  Abdominal: Soft. Bowel sounds are normal. He exhibits no distension and no mass. There is no tenderness. There is no rebound and no guarding.  Musculoskeletal: Normal range of motion. He exhibits no edema, tenderness or deformity.  Lymphadenopathy:    He has no cervical adenopathy.  Neurological: He is alert and oriented to person, place, and time.  Skin: Skin is warm and dry. No rash noted. He is not diaphoretic. No erythema. No pallor.  Psychiatric: He has a normal mood and affect. His behavior is normal. Judgment and thought content normal.  Vitals reviewed.   Lab Results  Component Value Date   WBC 3.7 06/15/2016   HGB 13.5 06/15/2016   HCT 40.2 06/15/2016   PLT 172 06/15/2016   GLUCOSE 84 07/06/2016   CHOL 205 (H) 02/04/2016   TRIG 189.0 (H) 02/04/2016   HDL 63.60 02/04/2016   LDLCALC 104 (H) 02/04/2016   ALT 40 06/15/2016   AST 38 06/15/2016   NA 142 07/06/2016   K 3.7 07/06/2016   CL 99 07/06/2016   CREATININE 1.11 07/06/2016   BUN 17 07/06/2016   CO2 25 07/06/2016   TSH 1.88 02/04/2016   PSA 0.72 02/04/2016   INR 1.00 11/13/2012   HGBA1C 5.6 12/19/2014    Dg Chest 2 View  Result Date: 06/18/2014 CLINICAL DATA:  LEFT-sided chest pain since this morning. EXAM: CHEST  2 VIEW COMPARISON:  11/18/2012. FINDINGS: Cardiopericardial silhouette within normal limits. Mediastinal contours normal. Trachea midline. No airspace disease or effusion. IMPRESSION: No active cardiopulmonary disease. Electronically Signed   By: Dereck Ligas M.D.   On: 06/18/2014 20:33    Assessment & Plan:    Kaiel was seen today for uri.  Diagnoses and all orders for this visit:  Cough- he has a very mild increase in his FeNO score indicating that there may be some allergic phenomenon causing his cough. Will treat with a course of methylprednisolone. His CRX is neg for mass or infiltrate -     POCT EXHALED NITRIC OXIDE -     DG Chest 2 View; Future  Seasonal allergic rhinitis due to pollen- will cont benadryl and will add a course of steroids  Other orders -     methylPREDNISolone (MEDROL DOSEPAK) 4 MG TBPK tablet; TAKE AS DIRECTED   I have discontinued Mr. Hardie's azithromycin. I am also having him start on methylPREDNISolone. Additionally, I am having him maintain his Cetirizine HCl, Turmeric, Cholecalciferol, cyclobenzaprine, acyclovir, sildenafil, pravastatin, potassium chloride SA, rivaroxaban, atenolol, and chlorthalidone.  Meds ordered this encounter  Medications  . methylPREDNISolone (MEDROL DOSEPAK) 4 MG TBPK tablet  Sig: TAKE AS DIRECTED    Dispense:  21 tablet    Refill:  0     Follow-up: Return in about 4 weeks (around 02/09/2017).  Scarlette Calico, MD

## 2017-01-13 ENCOUNTER — Encounter: Payer: Self-pay | Admitting: Internal Medicine

## 2017-02-04 ENCOUNTER — Other Ambulatory Visit (INDEPENDENT_AMBULATORY_CARE_PROVIDER_SITE_OTHER): Payer: 59

## 2017-02-04 ENCOUNTER — Ambulatory Visit (INDEPENDENT_AMBULATORY_CARE_PROVIDER_SITE_OTHER): Payer: 59 | Admitting: Internal Medicine

## 2017-02-04 ENCOUNTER — Encounter: Payer: Self-pay | Admitting: Internal Medicine

## 2017-02-04 VITALS — BP 116/68 | HR 44 | Temp 97.7°F | Resp 16 | Ht 69.0 in | Wt 213.0 lb

## 2017-02-04 DIAGNOSIS — Z0000 Encounter for general adult medical examination without abnormal findings: Secondary | ICD-10-CM

## 2017-02-04 DIAGNOSIS — R001 Bradycardia, unspecified: Secondary | ICD-10-CM | POA: Diagnosis not present

## 2017-02-04 DIAGNOSIS — Z23 Encounter for immunization: Secondary | ICD-10-CM | POA: Diagnosis not present

## 2017-02-04 DIAGNOSIS — I10 Essential (primary) hypertension: Secondary | ICD-10-CM | POA: Diagnosis not present

## 2017-02-04 DIAGNOSIS — E785 Hyperlipidemia, unspecified: Secondary | ICD-10-CM | POA: Diagnosis not present

## 2017-02-04 LAB — COMPREHENSIVE METABOLIC PANEL
ALBUMIN: 4.5 g/dL (ref 3.5–5.2)
ALT: 20 U/L (ref 0–53)
AST: 29 U/L (ref 0–37)
Alkaline Phosphatase: 59 U/L (ref 39–117)
BUN: 15 mg/dL (ref 6–23)
CHLORIDE: 102 meq/L (ref 96–112)
CO2: 32 mEq/L (ref 19–32)
Calcium: 10 mg/dL (ref 8.4–10.5)
Creatinine, Ser: 1.16 mg/dL (ref 0.40–1.50)
GFR: 69.97 mL/min (ref >60.00)
Glucose, Bld: 92 mg/dL (ref 70–99)
POTASSIUM: 3.7 meq/L (ref 3.5–5.1)
SODIUM: 141 meq/L (ref 135–145)
Total Bilirubin: 0.8 mg/dL (ref 0.2–1.2)
Total Protein: 6.9 g/dL (ref 6.0–8.3)

## 2017-02-04 LAB — PSA: PSA: 0.9 ng/mL (ref 0.10–4.00)

## 2017-02-04 LAB — URINALYSIS, ROUTINE W REFLEX MICROSCOPIC
Bilirubin Urine: NEGATIVE
HGB URINE DIPSTICK: NEGATIVE
KETONES UR: NEGATIVE
Leukocytes, UA: NEGATIVE
NITRITE: NEGATIVE
SPECIFIC GRAVITY, URINE: 1.015 (ref 1.000–1.030)
URINE GLUCOSE: NEGATIVE
UROBILINOGEN UA: 0.2 (ref 0.0–1.0)
pH: 7 (ref 5.0–8.0)

## 2017-02-04 LAB — LIPID PANEL
CHOLESTEROL: 238 mg/dL — AB (ref 0–200)
HDL: 73.8 mg/dL (ref >39.00)
LDL CALC: 134 mg/dL — AB (ref 0–99)
NonHDL: 163.78
TRIGLYCERIDES: 148 mg/dL (ref 0.0–149.0)
Total CHOL/HDL Ratio: 3
VLDL: 29.6 mg/dL (ref 0.0–40.0)

## 2017-02-04 LAB — CBC WITH DIFFERENTIAL/PLATELET
BASOS PCT: 0.4 % (ref 0.0–3.0)
Basophils Absolute: 0 10*3/uL (ref 0.0–0.1)
EOS PCT: 1.2 % (ref 0.0–5.0)
Eosinophils Absolute: 0 10*3/uL (ref 0.0–0.7)
HCT: 41.9 % (ref 39.0–52.0)
HEMOGLOBIN: 13.8 g/dL (ref 13.0–17.0)
LYMPHS PCT: 40.6 % (ref 12.0–46.0)
Lymphs Abs: 1.4 10*3/uL (ref 0.7–4.0)
MCHC: 32.8 g/dL (ref 30.0–36.0)
MCV: 79.2 fl (ref 78.0–100.0)
MONOS PCT: 10.1 % (ref 3.0–12.0)
Monocytes Absolute: 0.4 10*3/uL (ref 0.1–1.0)
Neutro Abs: 1.7 10*3/uL (ref 1.4–7.7)
Neutrophils Relative %: 47.7 % (ref 43.0–77.0)
Platelets: 185 10*3/uL (ref 150.0–400.0)
RBC: 5.29 Mil/uL (ref 4.22–5.81)
RDW: 14.7 % (ref 11.5–15.5)
WBC: 3.5 10*3/uL — AB (ref 4.0–10.5)

## 2017-02-04 LAB — TSH: TSH: 0.99 u[IU]/mL (ref 0.35–4.50)

## 2017-02-04 LAB — HIV ANTIBODY (ROUTINE TESTING W REFLEX): HIV: NONREACTIVE

## 2017-02-04 NOTE — Patient Instructions (Signed)

## 2017-02-04 NOTE — Progress Notes (Signed)
Subjective:  Patient ID: Willie Foster, male    DOB: [birth year]  Age: 51 y.o. MRN: [id number]  CC: Annual Exam; Hypertension; and Hyperlipidemia   HPI Willie Foster presents for a CPX.  He feels well and offers no complaints. He has good exercise endurance. He completes 30 minute exercises at the gym and runs 1.5 miles. He does not experience CP, DOE, SOB, palpitations, edema, or fatigue.  Outpatient Medications Prior to Visit  Medication Sig Dispense Refill  . Cetirizine HCl (ZYRTEC ALLERGY) 10 MG CAPS Take 10 mg by mouth daily.    . chlorthalidone (HYGROTON) 25 MG tablet Take 1 tablet (25 mg total) by mouth daily. 90 tablet 1  . Cholecalciferol 2000 UNITS CAPS Take 1 capsule (2,000 Units total) by mouth daily. 30 each   . cyclobenzaprine (FLEXERIL) 10 MG tablet Take 1 tablet (10 mg total) by mouth 3 (three) times daily as needed for muscle spasms. 90 tablet 1  . potassium chloride SA (KLOR-CON M20) 20 MEQ tablet Take 1 tablet (20 mEq total) by mouth 3 (three) times daily. 270 tablet 1  . pravastatin (PRAVACHOL) 40 MG tablet Take 1 tablet (40 mg total) by mouth daily. 90 tablet 3  . rivaroxaban (XARELTO) 10 MG TABS tablet Take 1 tablet (10 mg total) by mouth daily. 30 tablet 11  . sildenafil (VIAGRA) 100 MG tablet Take 1 tablet (100 mg total) by mouth daily as needed for erectile dysfunction. 10 tablet 11  . Turmeric 500 MG CAPS Take 1 tablet by mouth 2 (two) times daily.    Marland Kitchen atenolol (TENORMIN) 50 MG tablet Take 1 tablet (50 mg total) by mouth daily. 90 tablet 1  . acyclovir (ZOVIRAX) 400 MG tablet Take 1 tablet (400 mg total) by mouth 3 (three) times daily as needed (for fever blisters). (Patient not taking: Reported on 02/04/2017) 30 tablet 3  . methylPREDNISolone (MEDROL DOSEPAK) 4 MG TBPK tablet TAKE AS DIRECTED 21 tablet 0   No facility-administered medications prior to visit.     ROS Review of Systems  Constitutional: Negative.  Negative for appetite change, chills,  diaphoresis, fatigue and unexpected weight change.  Eyes: Negative.   Respiratory: Negative for cough, chest tightness, shortness of breath and wheezing.   Cardiovascular: Negative for chest pain, palpitations and leg swelling.  Gastrointestinal: Negative for abdominal pain, constipation, diarrhea, nausea and vomiting.  Endocrine: Negative.   Genitourinary: Negative.  Negative for decreased urine volume, difficulty urinating and urgency.  Musculoskeletal: Negative.   Skin: Negative.   Allergic/Immunologic: Negative.   Neurological: Negative.  Negative for dizziness, syncope, weakness, light-headedness and numbness.  Hematological: Negative.  Negative for adenopathy. Does not bruise/bleed easily.  Psychiatric/Behavioral: Negative.     Objective:  BP 116/68 (BP Location: Left Arm, Patient Position: Sitting, Cuff Size: Large)   Pulse (!) 44   Temp 97.7 F (36.5 C) (Oral)   Resp 16   Ht 5' 9"  (1.753 m)   Wt 213 lb (96.6 kg)   SpO2 98%   BMI 31.45 kg/m   BP Readings from Last 3 Encounters:  02/04/17 116/68  01/12/17 130/80  12/23/16 132/84    Wt Readings from Last 3 Encounters:  02/04/17 213 lb (96.6 kg)  01/12/17 220 lb (99.8 kg)  12/23/16 215 lb (97.5 kg)    Physical Exam  Constitutional: He is oriented to person, place, and time. No distress.  HENT:  Mouth/Throat: Oropharynx is clear and moist. No oropharyngeal exudate.  Eyes: Conjunctivae are normal. Right eye exhibits no  discharge. Left eye exhibits no discharge. No scleral icterus.  Neck: Normal range of motion. Neck supple. No JVD present. No thyromegaly present.  Cardiovascular: Regular rhythm and intact distal pulses.  Bradycardia present.  Exam reveals no gallop and no friction rub.   No murmur heard. EKG--    Marked sinus  Bradycardia  BORDERLINE RHYTHM   Pulmonary/Chest: Effort normal and breath sounds normal. No respiratory distress. He has no wheezes. He has no rales. He exhibits no tenderness.    Abdominal: Soft. Bowel sounds are normal. He exhibits no distension and no mass. There is no tenderness. There is no rebound and no guarding. Hernia confirmed negative in the right inguinal area and confirmed negative in the left inguinal area.  Genitourinary: Rectum normal, prostate normal, testes normal and penis normal. Rectal exam shows no external hemorrhoid, no internal hemorrhoid, no fissure, no mass, no tenderness and guaiac negative stool. Prostate is not enlarged and not tender. Right testis shows no mass, no swelling and no tenderness. Right testis is descended. Left testis shows no mass, no swelling and no tenderness. Left testis is descended. Circumcised. No penile erythema or penile tenderness. No discharge found.  Musculoskeletal: Normal range of motion. He exhibits no edema, tenderness or deformity.  Lymphadenopathy:    He has no cervical adenopathy.       Right: No inguinal adenopathy present.       Left: No inguinal adenopathy present.  Neurological: He is alert and oriented to person, place, and time.  Skin: Skin is warm and dry. No rash noted. He is not diaphoretic. No erythema. No pallor.  Vitals reviewed.   Lab Results  Component Value Date   WBC 3.5 (L) 02/04/2017   HGB 13.8 02/04/2017   HCT 41.9 02/04/2017   PLT 185.0 02/04/2017   GLUCOSE 92 02/04/2017   CHOL 238 (H) 02/04/2017   TRIG 148.0 02/04/2017   HDL 73.80 02/04/2017   LDLCALC 134 (H) 02/04/2017   ALT 20 02/04/2017   AST 29 02/04/2017   NA 141 02/04/2017   K 3.7 02/04/2017   CL 102 02/04/2017   CREATININE 1.16 02/04/2017   BUN 15 02/04/2017   CO2 32 02/04/2017   TSH 0.99 02/04/2017   PSA 0.90 02/04/2017   INR 1.00 11/13/2012   HGBA1C 5.6 12/19/2014    Dg Chest 2 View  Result Date: 01/12/2017 CLINICAL DATA:  Cough EXAM: CHEST  2 VIEW COMPARISON:  June 18, 2014 FINDINGS: Lungs are clear. Heart size and pulmonary vascularity are normal. No adenopathy. No pneumothorax. No bone lesions.  IMPRESSION: No edema or consolidation. Electronically Signed   By: Lowella Grip III M.D.   On: 01/12/2017 17:59    Assessment & Plan:   Willie Foster was seen today for annual exam, hypertension and hyperlipidemia.  Diagnoses and all orders for this visit:  Routine general medical examination at a health care facility- exam completed, labs reviewed, vaccines reviewed and updated, colon cancer screening is up-to-date, patient education material was given. -     Lipid panel; Future -     Comprehensive metabolic panel; Future -     CBC with Differential/Platelet; Future -     TSH; Future -     Urinalysis, Routine w reflex microscopic; Future -     PSA; Future -     HIV antibody; Future  Bradycardia- his heart rate is down to 44 but fortunately he is asymptomatic. His labs are negative for any secondary or metabolic causes of low HR. I'm  concerned the beta blocker therapy is causing this so I have asked him to stop taking atenolol. -     EKG 12-Lead  Need for influenza vaccination -     Flu Vaccine QUAD 36+ mos IM  Hyperlipidemia with target LDL less than 130- he has achieved his LDL goal and is doing well on the statin  Essential hypertension, benign- his BP is over-controlled and his HR is low, will discontinue the BB, will continue the thiazide diuretic   I have discontinued Mr. Gomillion's atenolol and methylPREDNISolone. I am also having him maintain his Cetirizine HCl, Turmeric, Cholecalciferol, cyclobenzaprine, acyclovir, sildenafil, pravastatin, potassium chloride SA, rivaroxaban, and chlorthalidone.  No orders of the defined types were placed in this encounter.    Follow-up: Return in about 4 months (around 06/06/2017).  Scarlette Calico, MD

## 2017-02-05 ENCOUNTER — Encounter: Payer: Self-pay | Admitting: Internal Medicine

## 2017-02-12 ENCOUNTER — Encounter: Payer: Self-pay | Admitting: Nurse Practitioner

## 2017-02-12 ENCOUNTER — Ambulatory Visit (INDEPENDENT_AMBULATORY_CARE_PROVIDER_SITE_OTHER): Payer: 59 | Admitting: Nurse Practitioner

## 2017-02-12 VITALS — BP 122/86 | HR 62 | Temp 98.2°F | Ht 69.0 in | Wt 214.0 lb

## 2017-02-12 DIAGNOSIS — H1032 Unspecified acute conjunctivitis, left eye: Secondary | ICD-10-CM | POA: Diagnosis not present

## 2017-02-12 MED ORDER — NEOMYCIN-POLYMYXIN-HC 3.5-10000-1 OP SUSP: 3.0000 [drp] | Freq: Three times a day (TID) | OPHTHALMIC | 0 refills | Status: DC

## 2017-02-12 NOTE — Progress Notes (Signed)
Subjective:  Patient ID: Willie Foster, male    DOB: 14-May-1966  Age: 51 y.o. MRN: [id number]  CC: Eye Problem (left eye sore,red,drainage--going on 1 eye--had style--eye doc told him to use warm compression/ pt just need to know how to take care of it,contagious?)   Eye Problem   The left eye is affected. This is a new problem. The current episode started yesterday. The problem occurs constantly. The problem has been rapidly worsening. There was no injury mechanism. The patient is experiencing no pain. There is no known exposure to pink eye. He wears contacts. Associated symptoms include eye redness and a foreign body sensation. Pertinent negatives include no blurred vision, eye discharge, double vision, itching, photophobia, recent URI or vomiting. He has tried commercial eye wash for the symptoms. The treatment provided no relief.    Outpatient Medications Prior to Visit  Medication Sig Dispense Refill  . acyclovir (ZOVIRAX) 400 MG tablet Take 1 tablet (400 mg total) by mouth 3 (three) times daily as needed (for fever blisters). 30 tablet 3  . Cetirizine HCl (ZYRTEC ALLERGY) 10 MG CAPS Take 10 mg by mouth daily.    . chlorthalidone (HYGROTON) 25 MG tablet Take 1 tablet (25 mg total) by mouth daily. 90 tablet 1  . Cholecalciferol 2000 UNITS CAPS Take 1 capsule (2,000 Units total) by mouth daily. 30 each   . cyclobenzaprine (FLEXERIL) 10 MG tablet Take 1 tablet (10 mg total) by mouth 3 (three) times daily as needed for muscle spasms. 90 tablet 1  . potassium chloride SA (KLOR-CON M20) 20 MEQ tablet Take 1 tablet (20 mEq total) by mouth 3 (three) times daily. 270 tablet 1  . pravastatin (PRAVACHOL) 40 MG tablet Take 1 tablet (40 mg total) by mouth daily. 90 tablet 3  . rivaroxaban (XARELTO) 10 MG TABS tablet Take 1 tablet (10 mg total) by mouth daily. 30 tablet 11  . sildenafil (VIAGRA) 100 MG tablet Take 1 tablet (100 mg total) by mouth daily as needed for erectile dysfunction. 10 tablet 11    . Turmeric 500 MG CAPS Take 1 tablet by mouth 2 (two) times daily.     No facility-administered medications prior to visit.     ROS See HPI  Objective:  BP 122/86   Pulse 62   Temp 98.2 F (36.8 C)   Ht 5' 9"  (1.753 m)   Wt 214 lb (97.1 kg)   SpO2 98%   BMI 31.60 kg/m   BP Readings from Last 3 Encounters:  02/12/17 122/86  02/04/17 116/68  01/12/17 130/80    Wt Readings from Last 3 Encounters:  02/12/17 214 lb (97.1 kg)  02/04/17 213 lb (96.6 kg)  01/12/17 220 lb (99.8 kg)    Physical Exam  Constitutional: He is oriented to person, place, and time. No distress.  Eyes: Pupils are equal, round, and reactive to light. EOM are normal. Right eye exhibits no chemosis, no discharge, no exudate and no hordeolum. No foreign body present in the right eye. Left eye exhibits chemosis. Left eye exhibits no discharge, no exudate and no hordeolum. No foreign body present in the left eye. Right conjunctiva is not injected. Right conjunctiva has no hemorrhage. Left conjunctiva is injected. Left conjunctiva has no hemorrhage.  Neck: Normal range of motion. Neck supple.  Cardiovascular: Normal rate.   Pulmonary/Chest: Effort normal.  Neurological: He is alert and oriented to person, place, and time.  Skin: No rash noted. No erythema.  Vitals reviewed.   Lab  Results  Component Value Date   WBC 3.5 (L) 02/04/2017   HGB 13.8 02/04/2017   HCT 41.9 02/04/2017   PLT 185.0 02/04/2017   GLUCOSE 92 02/04/2017   CHOL 238 (H) 02/04/2017   TRIG 148.0 02/04/2017   HDL 73.80 02/04/2017   LDLCALC 134 (H) 02/04/2017   ALT 20 02/04/2017   AST 29 02/04/2017   NA 141 02/04/2017   K 3.7 02/04/2017   CL 102 02/04/2017   CREATININE 1.16 02/04/2017   BUN 15 02/04/2017   CO2 32 02/04/2017   TSH 0.99 02/04/2017   PSA 0.90 02/04/2017   INR 1.00 11/13/2012   HGBA1C 5.6 12/19/2014    Dg Chest 2 View  Result Date: 01/12/2017 CLINICAL DATA:  Cough EXAM: CHEST  2 VIEW COMPARISON:  June 18, 2014 FINDINGS: Lungs are clear. Heart size and pulmonary vascularity are normal. No adenopathy. No pneumothorax. No bone lesions. IMPRESSION: No edema or consolidation. Electronically Signed   By: Lowella Grip III M.D.   On: 01/12/2017 17:59    Assessment & Plan:   Willie Foster was seen today for eye problem.  Diagnoses and all orders for this visit:  Acute conjunctivitis of left eye, unspecified acute conjunctivitis type -     neomycin-polymyxin-hydrocortisone (CORTISPORIN) 3.5-10000-1 ophthalmic suspension; Place 3 drops into the left eye 3 (three) times daily.   I am having Willie Foster start on neomycin-polymyxin-hydrocortisone. I am also having him maintain his Cetirizine HCl, Turmeric, Cholecalciferol, cyclobenzaprine, acyclovir, sildenafil, pravastatin, potassium chloride SA, rivaroxaban, and chlorthalidone.  Meds ordered this encounter  Medications  . neomycin-polymyxin-hydrocortisone (CORTISPORIN) 3.5-10000-1 ophthalmic suspension    Sig: Place 3 drops into the left eye 3 (three) times daily.    Dispense:  7.5 mL    Refill:  0    Order Specific Question:   Supervising Provider    Answer:   Cassandria Anger [[id number]]    Follow-up: Return if symptoms worsen or fail to improve.  Wilfred Lacy, NP

## 2017-02-12 NOTE — Patient Instructions (Signed)
Continue warm compress 2-3times a day (78mns at a time)   Bacterial Conjunctivitis Bacterial conjunctivitis is an infection of your conjunctiva. This is the clear membrane that covers the white part of your eye and the inner surface of your eyelid. This condition can make your eye:  Red or pink.  Itchy.  This condition is caused by bacteria. This condition spreads very easily from person to person (is contagious) and from one eye to the other eye. Follow these instructions at home: Medicines  Take or apply your antibiotic medicine as told by your doctor. Do not stop taking or applying the antibiotic even if you start to feel better.  Take or apply over-the-counter and prescription medicines only as told by your doctor.  Do not touch your eyelid with the eye drop bottle or the ointment tube. Managing discomfort  Wipe any fluid from your eye with a warm, wet washcloth or a cotton ball.  Place a cool, clean washcloth on your eye. Do this for 10-20 minutes, 3-4 times per day. General instructions  Do not wear contact lenses until the irritation is gone. Wear glasses until your doctor says it is okay to wear contacts.  Do not wear eye makeup until your symptoms are gone. Throw away any old makeup.  Change or wash your pillowcase every day.  Do not share towels or washcloths with anyone.  Wash your hands often with soap and water. Use paper towels to dry your hands.  Do not touch or rub your eyes.  Do not drive or use heavy machinery if your vision is blurry. Contact a doctor if:  You have a fever.  Your symptoms do not get better after 10 days. Get help right away if:  You have a fever and your symptoms suddenly get worse.  You have very bad pain when you move your eye.  Your face: ? Hurts. ? Is red. ? Is swollen.  You have sudden loss of vision. This information is not intended to replace advice given to you by your health care provider. Make sure you discuss any  questions you have with your health care provider. Document Released: 02/18/2008 Document Revised: 10/17/2015 Document Reviewed: 02/21/2015 Elsevier Interactive Patient Education  2[employer]

## 2017-03-28 ENCOUNTER — Other Ambulatory Visit: Payer: Self-pay | Admitting: Internal Medicine

## 2017-03-28 DIAGNOSIS — I10 Essential (primary) hypertension: Secondary | ICD-10-CM

## 2017-03-28 DIAGNOSIS — E876 Hypokalemia: Secondary | ICD-10-CM

## 2017-03-28 DIAGNOSIS — T502X5A Adverse effect of carbonic-anhydrase inhibitors, benzothiadiazides and other diuretics, initial encounter: Secondary | ICD-10-CM

## 2017-05-02 ENCOUNTER — Other Ambulatory Visit: Payer: Self-pay | Admitting: Internal Medicine

## 2017-05-02 DIAGNOSIS — E785 Hyperlipidemia, unspecified: Secondary | ICD-10-CM

## 2017-05-06 ENCOUNTER — Ambulatory Visit: Payer: BLUE CROSS/BLUE SHIELD | Admitting: Internal Medicine

## 2017-05-06 ENCOUNTER — Encounter: Payer: Self-pay | Admitting: Internal Medicine

## 2017-05-06 ENCOUNTER — Other Ambulatory Visit (INDEPENDENT_AMBULATORY_CARE_PROVIDER_SITE_OTHER): Payer: BLUE CROSS/BLUE SHIELD

## 2017-05-06 VITALS — BP 128/78 | HR 82 | Temp 98.2°F | Ht 69.0 in | Wt 213.0 lb

## 2017-05-06 DIAGNOSIS — I10 Essential (primary) hypertension: Secondary | ICD-10-CM

## 2017-05-06 DIAGNOSIS — B001 Herpesviral vesicular dermatitis: Secondary | ICD-10-CM

## 2017-05-06 DIAGNOSIS — E876 Hypokalemia: Secondary | ICD-10-CM

## 2017-05-06 DIAGNOSIS — I2782 Chronic pulmonary embolism: Secondary | ICD-10-CM

## 2017-05-06 DIAGNOSIS — T502X5A Adverse effect of carbonic-anhydrase inhibitors, benzothiadiazides and other diuretics, initial encounter: Secondary | ICD-10-CM

## 2017-05-06 LAB — BASIC METABOLIC PANEL
BUN: 15 mg/dL (ref 6–23)
CHLORIDE: 103 meq/L (ref 96–112)
CO2: 30 mEq/L (ref 19–32)
CREATININE: 1.02 mg/dL (ref 0.40–1.50)
Calcium: 9.4 mg/dL (ref 8.4–10.5)
GFR: 81.09 mL/min (ref >60.00)
GLUCOSE: 94 mg/dL (ref 70–99)
POTASSIUM: 4 meq/L (ref 3.5–5.1)
Sodium: 141 mEq/L (ref 135–145)

## 2017-05-06 MED ORDER — CHLORTHALIDONE 25 MG PO TABS: 25.0000 mg | ORAL_TABLET | Freq: Every day | ORAL | 1 refills | Status: DC

## 2017-05-06 MED ORDER — RIVAROXABAN 10 MG PO TABS: 10.0000 mg | ORAL_TABLET | Freq: Every day | ORAL | 1 refills | Status: DC

## 2017-05-06 MED ORDER — ACYCLOVIR 400 MG PO TABS
ORAL_TABLET | ORAL | 3 refills | Status: DC
Start: 2017-05-06 — End: 2019-01-12

## 2017-05-06 MED ORDER — POTASSIUM CHLORIDE CRYS ER 20 MEQ PO TBCR: 20.0000 meq | EXTENDED_RELEASE_TABLET | Freq: Every day | ORAL | 1 refills | Status: DC

## 2017-05-06 NOTE — Progress Notes (Signed)
Subjective:  Patient ID: Willie Foster, male    DOB: 1966/01/29  Age: 51 y.o. MRN: [id number]  CC: Hypertension   HPI Dickie Cloe presents for a BP check - he tells me that his BP has been well controlled. He feels well and offers no complaints.  Outpatient Medications Prior to Visit  Medication Sig Dispense Refill  . Cetirizine HCl (ZYRTEC ALLERGY) 10 MG CAPS Take 10 mg by mouth daily.    . Cholecalciferol 2000 UNITS CAPS Take 1 capsule (2,000 Units total) by mouth daily. 30 each   . cyclobenzaprine (FLEXERIL) 10 MG tablet Take 1 tablet (10 mg total) by mouth 3 (three) times daily as needed for muscle spasms. 90 tablet 1  . pravastatin (PRAVACHOL) 40 MG tablet TAKE ONE TABLET BY MOUTH ONCE DAILY 90 tablet 1  . sildenafil (VIAGRA) 100 MG tablet Take 1 tablet (100 mg total) by mouth daily as needed for erectile dysfunction. 10 tablet 11  . Turmeric 500 MG CAPS Take 1 tablet by mouth 2 (two) times daily.    Marland Kitchen acyclovir (ZOVIRAX) 400 MG tablet TAKE ONE TABLET BY MOUTH THREE TIMES DAILY AS NEEDED FOR  FEVER  BLISTERS 30 tablet 3  . chlorthalidone (HYGROTON) 25 MG tablet Take 1 tablet (25 mg total) by mouth daily. 90 tablet 1  . KLOR-CON M20 20 MEQ tablet TAKE ONE TABLET BY MOUTH TWICE DAILY 60 tablet 5  . rivaroxaban (XARELTO) 10 MG TABS tablet Take 1 tablet (10 mg total) by mouth daily. 30 tablet 11  . neomycin-polymyxin-hydrocortisone (CORTISPORIN) 3.5-10000-1 ophthalmic suspension Place 3 drops into the left eye 3 (three) times daily. 7.5 mL 0  . potassium chloride SA (KLOR-CON M20) 20 MEQ tablet Take 1 tablet (20 mEq total) by mouth 3 (three) times daily. 270 tablet 1   No facility-administered medications prior to visit.     ROS Review of Systems  Constitutional: Negative for appetite change, diaphoresis, fatigue and unexpected weight change.  HENT: Negative.  Negative for sore throat.   Eyes: Negative for visual disturbance.  Respiratory: Negative for cough, chest tightness,  shortness of breath and wheezing.   Cardiovascular: Negative for chest pain, palpitations and leg swelling.  Gastrointestinal: Negative for abdominal pain, diarrhea, nausea and vomiting.  Endocrine: Negative.   Genitourinary: Negative.  Negative for difficulty urinating, dysuria, hematuria and urgency.  Musculoskeletal: Negative.  Negative for back pain, myalgias and neck pain.  Skin: Negative.  Negative for color change and rash.  Neurological: Negative.  Negative for dizziness, weakness and light-headedness.  Hematological: Negative for adenopathy. Does not bruise/bleed easily.  Psychiatric/Behavioral: Negative.     Objective:  BP 128/78 (BP Location: Left Arm, Patient Position: Sitting, Cuff Size: Large)   Pulse 82   Temp 98.2 F (36.8 C) (Oral)   Ht 5' 9"  (1.753 m)   Wt 213 lb (96.6 kg)   SpO2 97%   BMI 31.45 kg/m   BP Readings from Last 3 Encounters:  05/06/17 128/78  02/12/17 122/86  02/04/17 116/68    Wt Readings from Last 3 Encounters:  05/06/17 213 lb (96.6 kg)  02/12/17 214 lb (97.1 kg)  02/04/17 213 lb (96.6 kg)    Physical Exam  Constitutional: He is oriented to person, place, and time. No distress.  HENT:  Mouth/Throat: Oropharynx is clear and moist.  Eyes: Conjunctivae are normal. Left eye exhibits no discharge. No scleral icterus.  Neck: Normal range of motion. Neck supple. No JVD present. No thyromegaly present.  Cardiovascular: Normal rate, regular  rhythm and normal heart sounds.  No murmur heard. Pulmonary/Chest: Effort normal and breath sounds normal. No respiratory distress. He has no wheezes. He has no rales.  Abdominal: Soft. Bowel sounds are normal. He exhibits no mass. There is no tenderness. There is no guarding.  Musculoskeletal: Normal range of motion. He exhibits no edema, tenderness or deformity.  Neurological: He is alert and oriented to person, place, and time.  Skin: Skin is warm and dry. No rash noted. He is not diaphoretic. No erythema.  No pallor.  Vitals reviewed.   Lab Results  Component Value Date   WBC 3.5 (L) 02/04/2017   HGB 13.8 02/04/2017   HCT 41.9 02/04/2017   PLT 185.0 02/04/2017   GLUCOSE 94 05/06/2017   CHOL 238 (H) 02/04/2017   TRIG 148.0 02/04/2017   HDL 73.80 02/04/2017   LDLCALC 134 (H) 02/04/2017   ALT 20 02/04/2017   AST 29 02/04/2017   NA 141 05/06/2017   K 4.0 05/06/2017   CL 103 05/06/2017   CREATININE 1.02 05/06/2017   BUN 15 05/06/2017   CO2 30 05/06/2017   TSH 0.99 02/04/2017   PSA 0.90 02/04/2017   INR 1.00 11/13/2012   HGBA1C 5.6 12/19/2014    Dg Chest 2 View  Result Date: 01/12/2017 CLINICAL DATA:  Cough EXAM: CHEST  2 VIEW COMPARISON:  June 18, 2014 FINDINGS: Lungs are clear. Heart size and pulmonary vascularity are normal. No adenopathy. No pneumothorax. No bone lesions. IMPRESSION: No edema or consolidation. Electronically Signed   By: Lowella Grip III M.D.   On: 01/12/2017 17:59    Assessment & Plan:   Akoni was seen today for hypertension.  Diagnoses and all orders for this visit:  Other chronic pulmonary embolism without acute cor pulmonale ([place])- Will cont xarelto at the current dose to avoid new thrombotic events -     rivaroxaban (XARELTO) 10 MG TABS tablet; Take 1 tablet (10 mg total) by mouth daily.  Essential hypertension, benign- His BP is well controlled, lytes and renal fxn are normal -     potassium chloride SA (KLOR-CON M20) 20 MEQ tablet; Take 1 tablet (20 mEq total) by mouth daily. -     chlorthalidone (HYGROTON) 25 MG tablet; Take 1 tablet (25 mg total) by mouth daily. -     Basic metabolic panel; Future  Diuretic-induced hypokalemia- Potassium level is normal, will cont the current dose of K+ supplementation -     potassium chloride SA (KLOR-CON M20) 20 MEQ tablet; Take 1 tablet (20 mEq total) by mouth daily. -     Basic metabolic panel; Future  Recurrent cold sores- Will cont zovirax at the current dose -     acyclovir (ZOVIRAX) 400 MG  tablet; TAKE ONE TABLET BY MOUTH THREE TIMES DAILY AS NEEDED FOR  FEVER  BLISTERS   I have discontinued Kysen Crossley's potassium chloride SA and neomycin-polymyxin-hydrocortisone. I have also changed his KLOR-CON M20 to potassium chloride SA. Additionally, I am having him maintain his Cetirizine HCl, Turmeric, Cholecalciferol, cyclobenzaprine, sildenafil, pravastatin, rivaroxaban, chlorthalidone, and acyclovir.  Meds ordered this encounter  Medications  . rivaroxaban (XARELTO) 10 MG TABS tablet    Sig: Take 1 tablet (10 mg total) by mouth daily.    Dispense:  90 tablet    Refill:  1  . potassium chloride SA (KLOR-CON M20) 20 MEQ tablet    Sig: Take 1 tablet (20 mEq total) by mouth daily.    Dispense:  90 tablet    Refill:  1    Please consider 90 day supplies to promote better adherence  . chlorthalidone (HYGROTON) 25 MG tablet    Sig: Take 1 tablet (25 mg total) by mouth daily.    Dispense:  90 tablet    Refill:  1  . acyclovir (ZOVIRAX) 400 MG tablet    Sig: TAKE ONE TABLET BY MOUTH THREE TIMES DAILY AS NEEDED FOR  FEVER  BLISTERS    Dispense:  30 tablet    Refill:  3    Please consider 90 day supplies to promote better adherence     Follow-up: Return in about 6 months (around 11/04/2017).  Scarlette Calico, MD

## 2017-05-06 NOTE — Patient Instructions (Signed)

## 2017-06-15 ENCOUNTER — Encounter: Payer: BLUE CROSS/BLUE SHIELD | Admitting: Oncology

## 2017-06-24 DIAGNOSIS — H524 Presbyopia: Secondary | ICD-10-CM | POA: Diagnosis not present

## 2017-06-24 DIAGNOSIS — H5213 Myopia, bilateral: Secondary | ICD-10-CM | POA: Diagnosis not present

## 2017-07-03 ENCOUNTER — Other Ambulatory Visit: Payer: Self-pay | Admitting: Oncology

## 2017-07-05 ENCOUNTER — Telehealth: Payer: Self-pay | Admitting: Internal Medicine

## 2017-07-05 NOTE — Telephone Encounter (Signed)
Copied from [place]. Topic: General - Other >> Jul 05, 2017 10:39 AM Darl Householder, RMA wrote: Reason for CRM: Medication refill request for  rivaroxaban (XARELTO) 10 MG to be sent to [facility], pt is currently out of medication

## 2017-07-05 NOTE — Telephone Encounter (Signed)
[place] to verify patient has refills, it was verified patient has refills and it will be filled today.

## 2017-07-12 ENCOUNTER — Encounter: Payer: Self-pay | Admitting: Oncology

## 2017-07-12 ENCOUNTER — Ambulatory Visit: Payer: BLUE CROSS/BLUE SHIELD | Admitting: Oncology

## 2017-07-12 ENCOUNTER — Other Ambulatory Visit: Payer: Self-pay

## 2017-07-12 VITALS — BP 158/92 | HR 62 | Temp 98.0°F | Ht 69.0 in | Wt 213.4 lb

## 2017-07-12 DIAGNOSIS — D6862 Lupus anticoagulant syndrome: Secondary | ICD-10-CM

## 2017-07-12 DIAGNOSIS — Z888 Allergy status to other drugs, medicaments and biological substances status: Secondary | ICD-10-CM | POA: Diagnosis not present

## 2017-07-12 DIAGNOSIS — Z7901 Long term (current) use of anticoagulants: Secondary | ICD-10-CM | POA: Diagnosis not present

## 2017-07-12 DIAGNOSIS — I2782 Chronic pulmonary embolism: Secondary | ICD-10-CM

## 2017-07-12 DIAGNOSIS — I10 Essential (primary) hypertension: Secondary | ICD-10-CM | POA: Diagnosis not present

## 2017-07-12 DIAGNOSIS — Z8719 Personal history of other diseases of the digestive system: Secondary | ICD-10-CM

## 2017-07-12 NOTE — Progress Notes (Signed)
Hematology and Oncology Follow Up Visit  Willie Foster [id number] 11-10-1965 52 y.o. 07/12/2017 6:19 PM   Principle Diagnosis: Encounter Diagnosis  Name Primary?  . Other chronic pulmonary embolism without acute cor pulmonale (HCC) Yes  Clinical summary: 52 year old man in overall excellent health without any major medical or surgical illness. He started to develop vague, intermittent, right-sided chest pain which occurred on and off during the month of May, 2014. He had a worsening of symptoms and presented to a local urgent care center where he was advised to go to the emergency department. CT scan angiogram of the chest done on June 22 showed pulmonary emboli in the right middle and right lower lobes. He was admitted to the hospital and initially started on unfractionated heparin and then changed to Xarelto. His symptoms completely resolved. Venous Doppler studies of his lower extremities done on November 14, 2012 negative for clot.  CXR on admission June 22 no mass or infiltrate CT angiogram other than the pulmonary emboli showed no mass or adenopathy. He had no recent prolonged travel, no infection, no immobilization; no surgery. He had no constitutional symptoms. He had no signs or symptoms of a collagen vascular disorder. He admitted that on rare occasions when he strains at stools he sees a small amount of blood on the toilet tissue. A subsequent colonoscopy was normal 05/03/13 except for internal hemorrhoids which were sclerosed at time of the procedure. There is a strong family history of cancer. His mother died of complications of breast cancer metastatic to brain. Father died of lung cancer. He has a 53 year old sister who has triplets. No one in the family with history of blood clots. He lost a child at age 21 with a ruptured cerebral aneurysm. He has no other children. He is single at this time but has a steady girlfriend. He has no HIV risk factors and specifically denied use of  recreational drugs, male sex, or previous blood transfusions.  Special hematology studies: Negative for the presence of the factor V Leiden and the prothrombin gene mutations Normal functional protein S, free protein S, and protein C activity Normal antithrombin III Low positive anticardiolipin antibody IgG 31 units but negative beta-2 glycoprotein 1 antibodies including IgG subclass. Positive lupus anticoagulant likely spurious due to the use of Xarelto with a negative hexagonal phospholipid screen. Serum immunoglobulins normal and no monoclonal proteins on immunofixation electrophoresis.  Due to the unprovoked nature of his pulmonary emboli, I advised long-term anticoagulation.   Interim History: He is doing well.  He lost some weight and his primary care physician was able to discontinue his antihypertensives. He denies any dyspnea, chest pain, palpitations, leg pain or swelling. No bleeding problems on the Xarelto and he denies any epistaxis, gum bleeding, hematuria, severe headache, hematochezia, or melena. He is still employed as a Press photographer person for expensive cars.  He is still in a steady relationship with the same woman.  He is increasing his exercise.  Medications: reviewed  Allergies:  Allergies  Allergen Reactions  . Lipitor [Atorvastatin]     Elevated LFT's    Review of Systems: See interim history Remaining ROS negative:   Physical Exam: Blood pressure (!) 158/92, pulse 62, temperature 98 F (36.7 C), temperature source Oral, height 5' 9"  (1.753 m), weight 213 lb 6.4 oz (96.8 kg), SpO2 98 %. Wt Readings from Last 3 Encounters:  07/12/17 213 lb 6.4 oz (96.8 kg)  05/06/17 213 lb (96.6 kg)  02/12/17 214 lb (97.1 kg)  General appearance: Muscular, well-nourished African-American man HENNT: Pharynx no erythema, exudate, mass, or ulcer. No thyromegaly or thyroid nodules Lymph nodes: No cervical, supraclavicular, or axillary lymphadenopathy Breasts:  Lungs: Clear  to auscultation, resonant to percussion throughout Heart: Regular rhythm, no murmur, no gallop, no rub, no click, no edema Abdomen: Soft, nontender, normal bowel sounds, no mass, no organomegaly Extremities: No edema, no calf tenderness Musculoskeletal: no joint deformities GU:  Vascular: Carotid pulses 2+, no bruits,  Neurologic: Alert, oriented, PERRLA, optic discs sharp and vessels normal, no hemorrhage or exudate, cranial nerves grossly normal, motor strength 5 over 5, reflexes 1+ symmetric, upper body coordination normal, gait normal, Skin: No rash or ecchymosis  Lab Results: CBC W/Diff    Component Value Date/Time   WBC 3.5 (L) 02/04/2017 0956   RBC 5.29 02/04/2017 0956   HGB 13.8 02/04/2017 0956   HGB 13.5 06/15/2016 1535   HGB 13.3 05/12/2013 1536   HCT 41.9 02/04/2017 0956   HCT 40.2 06/15/2016 1535   HCT 41.9 05/12/2013 1536   PLT 185.0 02/04/2017 0956   PLT 172 06/15/2016 1535   MCV 79.2 02/04/2017 0956   MCV 77 (L) 06/15/2016 1535   MCV 78.2 (L) 05/12/2013 1536   MCH 26.0 (L) 06/15/2016 1535   MCH 26.5 01/02/2014 0914   MCHC 32.8 02/04/2017 0956   RDW 14.7 02/04/2017 0956   RDW 15.3 06/15/2016 1535   RDW 14.0 05/12/2013 1536   LYMPHSABS 1.4 02/04/2017 0956   LYMPHSABS 1.7 06/15/2016 1535   LYMPHSABS 1.8 05/12/2013 1536   MONOABS 0.4 02/04/2017 0956   MONOABS 0.5 05/12/2013 1536   EOSABS 0.0 02/04/2017 0956   EOSABS 0.1 06/15/2016 1535   BASOSABS 0.0 02/04/2017 0956   BASOSABS 0.0 06/15/2016 1535   BASOSABS 0.0 05/12/2013 1536     Chemistry      Component Value Date/Time   NA 141 05/06/2017 0931   NA 142 07/06/2016 0840   NA 141 01/18/2013 1158   K 4.0 05/06/2017 0931   K 3.6 01/18/2013 1158   CL 103 05/06/2017 0931   CO2 30 05/06/2017 0931   CO2 25 01/18/2013 1158   BUN 15 05/06/2017 0931   BUN 17 07/06/2016 0840   BUN 12.5 01/18/2013 1158   CREATININE 1.02 05/06/2017 0931   CREATININE 1.17 01/02/2014 0914   CREATININE 1.0 01/18/2013 1158       Component Value Date/Time   CALCIUM 9.4 05/06/2017 0931   CALCIUM 9.8 01/18/2013 1158   ALKPHOS 59 02/04/2017 0956   ALKPHOS 63 01/18/2013 1158   AST 29 02/04/2017 0956   AST 30 01/18/2013 1158   ALT 20 02/04/2017 0956   ALT 45 01/18/2013 1158   BILITOT 0.8 02/04/2017 0956   BILITOT 0.3 06/15/2016 1535   BILITOT 0.47 01/18/2013 1158       Radiological Studies: No results found.  Impression:  1.  Idiopathic unprovoked pulmonary emboli now out almost 5 years from diagnosis in May 2014. No subsequent thrombotic events on chronic Xarelto anticoagulation. Plan: Continue the same.  2.  Essential hypertension. Blood pressure creeping up again off his antihypertensives.  I will defer further management to his primary care physician.  CC: Patient Care Team: [name], MD as PCP - General (Internal Medicine)   Murriel Hopper, MD, [place]  Hematology-Oncology/Internal Medicine     2/18/20196:19 PM

## 2017-07-12 NOTE — Patient Instructions (Addendum)
Return visit December 2019 or January  2020 Lab 1 week before visit

## 2017-07-13 NOTE — Addendum Note (Signed)
Addended by: Truddie Crumble on: 07/13/2017 08:52 AM   Modules accepted: Orders

## 2017-07-15 ENCOUNTER — Other Ambulatory Visit (INDEPENDENT_AMBULATORY_CARE_PROVIDER_SITE_OTHER): Payer: BLUE CROSS/BLUE SHIELD

## 2017-07-15 ENCOUNTER — Ambulatory Visit: Payer: BLUE CROSS/BLUE SHIELD | Admitting: Internal Medicine

## 2017-07-15 ENCOUNTER — Encounter: Payer: Self-pay | Admitting: Internal Medicine

## 2017-07-15 ENCOUNTER — Telehealth: Payer: Self-pay | Admitting: *Deleted

## 2017-07-15 VITALS — BP 154/90 | HR 70 | Temp 98.4°F | Resp 16 | Wt 207.0 lb

## 2017-07-15 DIAGNOSIS — T502X5A Adverse effect of carbonic-anhydrase inhibitors, benzothiadiazides and other diuretics, initial encounter: Secondary | ICD-10-CM | POA: Diagnosis not present

## 2017-07-15 DIAGNOSIS — I10 Essential (primary) hypertension: Secondary | ICD-10-CM

## 2017-07-15 DIAGNOSIS — R0683 Snoring: Secondary | ICD-10-CM

## 2017-07-15 DIAGNOSIS — E876 Hypokalemia: Secondary | ICD-10-CM

## 2017-07-15 LAB — BASIC METABOLIC PANEL
BUN: 15 mg/dL (ref 6–23)
CALCIUM: 9.9 mg/dL (ref 8.4–10.5)
CO2: 30 meq/L (ref 19–32)
CREATININE: 1.1 mg/dL (ref 0.40–1.50)
Chloride: 103 mEq/L (ref 96–112)
GFR: 74.27 mL/min (ref >60.00)
GLUCOSE: 92 mg/dL (ref 70–99)
Potassium: 4.1 mEq/L (ref 3.5–5.1)
Sodium: 142 mEq/L (ref 135–145)

## 2017-07-15 LAB — VITAMIN D 25 HYDROXY (VIT D DEFICIENCY, FRACTURES): VITD: 39.79 ng/mL (ref 30.00–100.00)

## 2017-07-15 MED ORDER — NEBIVOLOL HCL 5 MG PO TABS: 5.0000 mg | ORAL_TABLET | Freq: Every day | ORAL | 1 refills | Status: DC

## 2017-07-15 NOTE — Patient Instructions (Signed)

## 2017-07-15 NOTE — Progress Notes (Signed)
Subjective:  Patient ID: Willie Foster, male    DOB: 10-20-1965  Age: 52 y.o. MRN: [id number]  CC: Hypertension   HPI Zayn Selley presents for concerns about an elevated blood pressure.  He was seen by a hematologist a few days ago and his blood pressure was up to 158/92.  He tells me he is compliant with chlorthalidone.  He complains of snoring but does not know if he has sleep apnea.  He is diligent with his lifestyle modifications including diet, exercise, and weight loss.  Outpatient Medications Prior to Visit  Medication Sig Dispense Refill  . acyclovir (ZOVIRAX) 400 MG tablet TAKE ONE TABLET BY MOUTH THREE TIMES DAILY AS NEEDED FOR  FEVER  BLISTERS 30 tablet 3  . Cetirizine HCl (ZYRTEC ALLERGY) 10 MG CAPS Take 10 mg by mouth daily.    . chlorthalidone (HYGROTON) 25 MG tablet Take 1 tablet (25 mg total) by mouth daily. 90 tablet 1  . Cholecalciferol 2000 UNITS CAPS Take 1 capsule (2,000 Units total) by mouth daily. 30 each   . cyclobenzaprine (FLEXERIL) 10 MG tablet Take 1 tablet (10 mg total) by mouth 3 (three) times daily as needed for muscle spasms. 90 tablet 1  . potassium chloride SA (KLOR-CON M20) 20 MEQ tablet Take 1 tablet (20 mEq total) by mouth daily. 90 tablet 1  . pravastatin (PRAVACHOL) 40 MG tablet TAKE ONE TABLET BY MOUTH ONCE DAILY 90 tablet 1  . rivaroxaban (XARELTO) 10 MG TABS tablet Take 1 tablet (10 mg total) by mouth daily. 90 tablet 1  . sildenafil (VIAGRA) 100 MG tablet Take 1 tablet (100 mg total) by mouth daily as needed for erectile dysfunction. 10 tablet 11  . Turmeric 500 MG CAPS Take 1 tablet by mouth 2 (two) times daily.     No facility-administered medications prior to visit.     ROS Review of Systems  Constitutional: Negative for appetite change, diaphoresis, fatigue and unexpected weight change.  HENT: Negative.   Eyes: Negative for photophobia and visual disturbance.  Respiratory: Negative for cough, chest tightness, shortness of breath and  wheezing.        +snoring  Cardiovascular: Negative for chest pain, palpitations and leg swelling.  Gastrointestinal: Negative for abdominal pain, constipation, diarrhea, nausea and vomiting.  Endocrine: Negative.   Genitourinary: Negative.  Negative for difficulty urinating, dysuria and hematuria.  Musculoskeletal: Negative.   Skin: Negative.   Allergic/Immunologic: Negative.   Neurological: Negative.  Negative for dizziness, weakness, light-headedness and headaches.  Hematological: Negative for adenopathy. Does not bruise/bleed easily.  Psychiatric/Behavioral: Negative.     Objective:  BP (!) 154/90 (BP Location: Left Arm, Patient Position: Sitting, Cuff Size: Large)   Pulse 70   Temp 98.4 F (36.9 C) (Oral)   Resp 16   Wt 207 lb (93.9 kg)   SpO2 97%   BMI 30.57 kg/m   BP Readings from Last 3 Encounters:  07/15/17 (!) 154/90  07/12/17 (!) 158/92  05/06/17 128/78    Wt Readings from Last 3 Encounters:  07/15/17 207 lb (93.9 kg)  07/12/17 213 lb 6.4 oz (96.8 kg)  05/06/17 213 lb (96.6 kg)    Physical Exam  Constitutional: He is oriented to person, place, and time. No distress.  HENT:  Mouth/Throat: Oropharynx is clear and moist. No oropharyngeal exudate.  Eyes: Conjunctivae are normal. Left eye exhibits no discharge. No scleral icterus.  Neck: Normal range of motion. Neck supple. No JVD present. No thyromegaly present.  Cardiovascular: Normal rate, regular  rhythm and normal heart sounds. Exam reveals no gallop.  No murmur heard. Pulmonary/Chest: Effort normal and breath sounds normal. No respiratory distress. He has no wheezes. He has no rales.  Abdominal: Soft. Bowel sounds are normal. He exhibits no distension and no mass. There is no tenderness. There is no guarding.  Musculoskeletal: Normal range of motion. He exhibits no edema or tenderness.  Lymphadenopathy:    He has no cervical adenopathy.  Neurological: He is alert and oriented to person, place, and time.    Skin: Skin is warm and dry. No rash noted. He is not diaphoretic. No erythema. No pallor.  Vitals reviewed.   Lab Results  Component Value Date   WBC 3.5 (L) 02/04/2017   HGB 13.8 02/04/2017   HCT 41.9 02/04/2017   PLT 185.0 02/04/2017   GLUCOSE 92 07/15/2017   CHOL 238 (H) 02/04/2017   TRIG 148.0 02/04/2017   HDL 73.80 02/04/2017   LDLCALC 134 (H) 02/04/2017   ALT 20 02/04/2017   AST 29 02/04/2017   NA 142 07/15/2017   K 4.1 07/15/2017   CL 103 07/15/2017   CREATININE 1.10 07/15/2017   BUN 15 07/15/2017   CO2 30 07/15/2017   TSH 0.99 02/04/2017   PSA 0.90 02/04/2017   INR 1.00 11/13/2012   HGBA1C 5.6 12/19/2014    Dg Chest 2 View  Result Date: 01/12/2017 CLINICAL DATA:  Cough EXAM: CHEST  2 VIEW COMPARISON:  June 18, 2014 FINDINGS: Lungs are clear. Heart size and pulmonary vascularity are normal. No adenopathy. No pneumothorax. No bone lesions. IMPRESSION: No edema or consolidation. Electronically Signed   By: Lowella Grip III M.D.   On: 01/12/2017 17:59    Assessment & Plan:   Devarious was seen today for hypertension.  Diagnoses and all orders for this visit:  Snoring -     Ambulatory referral to Sleep Studies  Diuretic-induced hypokalemia- His potassium level is in the normal range.  He will continue the current potassium supplementation. -     Basic metabolic panel; Future  Essential hypertension, benign- His blood pressure is not adequately well controlled on the thiazide diuretic.  Will add nebivolol.  Will also screen for sleep apnea. -     Basic metabolic panel; Future -     VITAMIN D 25 Hydroxy (Vit-D Deficiency, Fractures); Future -     nebivolol (BYSTOLIC) 5 MG tablet; Take 1 tablet (5 mg total) by mouth daily.   I am having [name] start on nebivolol. I am also having him maintain his Cetirizine HCl, Turmeric, Cholecalciferol, cyclobenzaprine, sildenafil, pravastatin, rivaroxaban, potassium chloride SA, chlorthalidone, and  acyclovir.  Meds ordered this encounter  Medications  . nebivolol (BYSTOLIC) 5 MG tablet    Sig: Take 1 tablet (5 mg total) by mouth daily.    Dispense:  90 tablet    Refill:  1     Follow-up: Return in about 3 months (around 10/12/2017).  Scarlette Calico, MD

## 2017-07-15 NOTE — Telephone Encounter (Signed)
PA for Bystolic 5 mg sent to [place] via CoverMyMeds.

## 2017-07-16 ENCOUNTER — Encounter: Payer: Self-pay | Admitting: Internal Medicine

## 2017-07-16 ENCOUNTER — Telehealth: Payer: Self-pay | Admitting: Internal Medicine

## 2017-07-16 NOTE — Telephone Encounter (Signed)
Copied from [place]. Topic: Quick Communication - See Telephone Encounter >> Jul 16, 2017  4:03 PM Ether Griffins B wrote: CRM for notification. See Telephone encounter for:  Pt calling in regards to bystolic he has went to [facility] to pick up the medication and they are needing PA in order for insurance to cover this medication.  07/16/17.

## 2017-07-19 ENCOUNTER — Other Ambulatory Visit: Payer: Self-pay | Admitting: Internal Medicine

## 2017-07-19 DIAGNOSIS — I10 Essential (primary) hypertension: Secondary | ICD-10-CM

## 2017-07-19 MED ORDER — CARVEDILOL 3.125 MG PO TABS: 3.1250 mg | ORAL_TABLET | Freq: Two times a day (BID) | ORAL | 0 refills | Status: DC

## 2017-07-19 NOTE — Telephone Encounter (Signed)
Left detailed for pt on mobile number with information regarding the PA for Bystolic and the new medication that was prescribed.

## 2017-07-19 NOTE — Telephone Encounter (Signed)
There is already a phone note started regarding the PA for the bystolic. I am closing this note

## 2017-07-19 NOTE — Telephone Encounter (Signed)
PA for Bystolic was denied. Is there an alternative?

## 2017-07-19 NOTE — Telephone Encounter (Signed)
Changed and sent

## 2017-07-19 NOTE — Progress Notes (Signed)
RX changed

## 2017-08-18 ENCOUNTER — Institutional Professional Consult (permissible substitution): Payer: Self-pay | Admitting: Neurology

## 2017-08-18 ENCOUNTER — Encounter: Payer: Self-pay | Admitting: Internal Medicine

## 2017-08-18 ENCOUNTER — Ambulatory Visit: Payer: BLUE CROSS/BLUE SHIELD | Admitting: Internal Medicine

## 2017-08-18 VITALS — BP 132/80 | HR 72 | Temp 98.0°F | Resp 16 | Ht 69.0 in | Wt 207.2 lb

## 2017-08-18 DIAGNOSIS — K648 Other hemorrhoids: Secondary | ICD-10-CM | POA: Insufficient documentation

## 2017-08-18 MED ORDER — HYDROCORTISONE ACE-PRAMOXINE 1-1 % RE FOAM: 1.0000 | Freq: Two times a day (BID) | RECTAL | 1 refills | Status: DC

## 2017-08-18 NOTE — Patient Instructions (Signed)
Hemorrhoids Hemorrhoids are swollen veins in and around the rectum or anus. There are two types of hemorrhoids:  Internal hemorrhoids. These occur in the veins that are just inside the rectum. They may poke through to the outside and become irritated and painful.  External hemorrhoids. These occur in the veins that are outside of the anus and can be felt as a painful swelling or hard lump near the anus.  Most hemorrhoids do not cause serious problems, and they can be managed with home treatments such as diet and lifestyle changes. If home treatments do not help your symptoms, procedures can be done to shrink or remove the hemorrhoids. What are the causes? This condition is caused by increased pressure in the anal area. This pressure may result from various things, including:  Constipation.  Straining to have a bowel movement.  Diarrhea.  Pregnancy.  Obesity.  Sitting for long periods of time.  Heavy lifting or other activity that causes you to strain.  Anal sex.  What are the signs or symptoms? Symptoms of this condition include:  Pain.  Anal itching or irritation.  Rectal bleeding.  Leakage of stool (feces).  Anal swelling.  One or more lumps around the anus.  How is this diagnosed? This condition can often be diagnosed through a visual exam. Other exams or tests may also be done, such as:  Examination of the rectal area with a gloved hand (digital rectal exam).  Examination of the anal canal using a small tube (anoscope).  A blood test, if you have lost a significant amount of blood.  A test to look inside the colon (sigmoidoscopy or colonoscopy).  How is this treated? This condition can usually be treated at home. However, various procedures may be done if dietary changes, lifestyle changes, and other home treatments do not help your symptoms. These procedures can help make the hemorrhoids smaller or remove them completely. Some of these procedures involve  surgery, and others do not. Common procedures include:  Rubber band ligation. Rubber bands are placed at the base of the hemorrhoids to cut off the blood supply to them.  Sclerotherapy. Medicine is injected into the hemorrhoids to shrink them.  Infrared coagulation. A type of light energy is used to get rid of the hemorrhoids.  Hemorrhoidectomy surgery. The hemorrhoids are surgically removed, and the veins that supply them are tied off.  Stapled hemorrhoidopexy surgery. A circular stapling device is used to remove the hemorrhoids and use staples to cut off the blood supply to them.  Follow these instructions at home: Eating and drinking  Eat foods that have a lot of fiber in them, such as whole grains, beans, nuts, fruits, and vegetables. Ask your health care provider about taking products that have added fiber (fiber supplements).  Drink enough fluid to keep your urine clear or pale yellow. Managing pain and swelling  Take warm sitz baths for 20 minutes, 3-4 times a day to ease pain and discomfort.  If directed, apply ice to the affected area. Using ice packs between sitz baths may be helpful. ? Put ice in a plastic bag. ? Place a towel between your skin and the bag. ? Leave the ice on for 20 minutes, 2-3 times a day. General instructions  Take over-the-counter and prescription medicines only as told by your health care provider.  Use medicated creams or suppositories as told.  Exercise regularly.  Go to the bathroom when you have the urge to have a bowel movement. Do not wait.    Avoid straining to have bowel movements.  Keep the anal area dry and clean. Use wet toilet paper or moist towelettes after a bowel movement.  Do not sit on the toilet for long periods of time. This increases blood pooling and pain. Contact a health care provider if:  You have increasing pain and swelling that are not controlled by treatment or medicine.  You have uncontrolled bleeding.  You  have difficulty having a bowel movement, or you are unable to have a bowel movement.  You have pain or inflammation outside the area of the hemorrhoids. This information is not intended to replace advice given to you by your health care provider. Make sure you discuss any questions you have with your health care provider. Document Released: 05/08/2000 Document Revised: 10/09/2015 Document Reviewed: 01/23/2015 Elsevier Interactive Patient Education  2018 [employer] Inc[employer]  

## 2017-08-22 NOTE — Progress Notes (Signed)
Subjective:  Patient ID: Willie Foster, male    DOB: Feb 24, 1966  Age: 52 y.o. MRN: [id number]  CC: Hemorrhoids   HPI [name] presents for a several week history of feeling an asymptomatic bump on his anus when he wipes.  Outpatient Medications Prior to Visit  Medication Sig Dispense Refill  . acyclovir (ZOVIRAX) 400 MG tablet TAKE ONE TABLET BY MOUTH THREE TIMES DAILY AS NEEDED FOR  FEVER  BLISTERS 30 tablet 3  . carvedilol (COREG) 3.125 MG tablet Take 1 tablet (3.125 mg total) by mouth 2 (two) times daily with a meal. 180 tablet 0  . Cetirizine HCl (ZYRTEC ALLERGY) 10 MG CAPS Take 10 mg by mouth daily.    . chlorthalidone (HYGROTON) 25 MG tablet Take 1 tablet (25 mg total) by mouth daily. 90 tablet 1  . Cholecalciferol 2000 UNITS CAPS Take 1 capsule (2,000 Units total) by mouth daily. 30 each   . Omega-3 Fatty Acids (FISH OIL PO) Take by mouth.    . potassium chloride SA (KLOR-CON M20) 20 MEQ tablet Take 1 tablet (20 mEq total) by mouth daily. 90 tablet 1  . pravastatin (PRAVACHOL) 40 MG tablet TAKE ONE TABLET BY MOUTH ONCE DAILY 90 tablet 1  . rivaroxaban (XARELTO) 10 MG TABS tablet Take 1 tablet (10 mg total) by mouth daily. 90 tablet 1  . Turmeric 500 MG CAPS Take 1 tablet by mouth 2 (two) times daily.    . vitamin C (ASCORBIC ACID) 500 MG tablet Take 500 mg by mouth daily.    . sildenafil (VIAGRA) 100 MG tablet Take 1 tablet (100 mg total) by mouth daily as needed for erectile dysfunction. (Patient not taking: Reported on 08/18/2017) 10 tablet 11  . cyclobenzaprine (FLEXERIL) 10 MG tablet Take 1 tablet (10 mg total) by mouth 3 (three) times daily as needed for muscle spasms. (Patient not taking: Reported on 08/18/2017) 90 tablet 1   No facility-administered medications prior to visit.     ROS Review of Systems  Constitutional: Negative.   HENT: Negative.   Eyes: Negative.   Respiratory: Negative.  Negative for cough, chest tightness, shortness of breath and wheezing.     Cardiovascular: Negative for chest pain, palpitations and leg swelling.  Gastrointestinal: Negative for abdominal pain, anal bleeding, blood in stool, constipation, diarrhea, nausea and rectal pain.  Endocrine: Negative.   Genitourinary: Negative.  Negative for difficulty urinating.  Musculoskeletal: Negative.   Skin: Negative.   Allergic/Immunologic: Negative.   Neurological: Negative.   Hematological: Negative for adenopathy. Does not bruise/bleed easily.  Psychiatric/Behavioral: Negative.     Objective:  BP 132/80 (BP Location: Left Arm, Patient Position: Sitting, Cuff Size: Large)   Pulse 72   Temp 98 F (36.7 C) (Oral)   Ht 5' 9"  (1.753 m)   Wt 207 lb 4 oz (94 kg)   SpO2 98%   BMI 30.61 kg/m   BP Readings from Last 3 Encounters:  08/18/17 132/80  07/15/17 (!) 154/90  07/12/17 (!) 158/92    Wt Readings from Last 3 Encounters:  08/18/17 207 lb 4 oz (94 kg)  07/15/17 207 lb (93.9 kg)  07/12/17 213 lb 6.4 oz (96.8 kg)    Physical Exam  Constitutional: He is oriented to person, place, and time. No distress.  HENT:  Mouth/Throat: Oropharynx is clear and moist. No oropharyngeal exudate.  Eyes: Conjunctivae are normal. Left eye exhibits no discharge. No scleral icterus.  Neck: Normal range of motion. Neck supple. No JVD present. No thyromegaly  present.  Cardiovascular: Normal rate, regular rhythm and normal heart sounds. Exam reveals no gallop and no friction rub.  No murmur heard. Pulmonary/Chest: Effort normal and breath sounds normal. No respiratory distress. He has no wheezes. He has no rales.  Abdominal: Soft. Bowel sounds are normal. He exhibits no distension and no mass. There is no tenderness. There is no guarding.  Genitourinary: Rectal exam shows internal hemorrhoid. Rectal exam shows no external hemorrhoid, no fissure, no mass, no tenderness, anal tone normal and guaiac negative stool. Prostate is not enlarged.  Genitourinary Comments: He has small,  uncomplicated internal anal hemorrhoids.  Musculoskeletal: Normal range of motion. He exhibits no edema or tenderness.  Lymphadenopathy:    He has no cervical adenopathy.  Neurological: He is alert and oriented to person, place, and time.  Skin: Skin is warm and dry. No rash noted. He is not diaphoretic. No erythema. No pallor.  Vitals reviewed.   Lab Results  Component Value Date   WBC 3.5 (L) 02/04/2017   HGB 13.8 02/04/2017   HCT 41.9 02/04/2017   PLT 185.0 02/04/2017   GLUCOSE 92 07/15/2017   CHOL 238 (H) 02/04/2017   TRIG 148.0 02/04/2017   HDL 73.80 02/04/2017   LDLCALC 134 (H) 02/04/2017   ALT 20 02/04/2017   AST 29 02/04/2017   NA 142 07/15/2017   K 4.1 07/15/2017   CL 103 07/15/2017   CREATININE 1.10 07/15/2017   BUN 15 07/15/2017   CO2 30 07/15/2017   TSH 0.99 02/04/2017   PSA 0.90 02/04/2017   INR 1.00 11/13/2012   HGBA1C 5.6 12/19/2014    Dg Chest 2 View  Result Date: 01/12/2017 CLINICAL DATA:  Cough EXAM: CHEST  2 VIEW COMPARISON:  June 18, 2014 FINDINGS: Lungs are clear. Heart size and pulmonary vascularity are normal. No adenopathy. No pneumothorax. No bone lesions. IMPRESSION: No edema or consolidation. Electronically Signed   By: Lowella Grip III M.D.   On: 01/12/2017 17:59    Assessment & Plan:   Jazz was seen today for hemorrhoids.  Diagnoses and all orders for this visit:  Hemorrhoids, internal- I offered him reassurance about this.  I do not recommend that he have a surgical intervention as the hemorrhoids are very small and do not cause any symptoms.  For now, he will use Proctofoam HC cream to try to shrink the hemorrhoids. -     hydrocortisone-pramoxine (PROCTOFOAM HC) rectal foam; Place 1 applicator rectally 2 (two) times daily.   I have discontinued Raynaldo Heft's cyclobenzaprine. I am also having him start on hydrocortisone-pramoxine. Additionally, I am having him maintain his Cetirizine HCl, Turmeric, Cholecalciferol,  sildenafil, pravastatin, rivaroxaban, potassium chloride SA, chlorthalidone, acyclovir, carvedilol, vitamin C, and Omega-3 Fatty Acids (FISH OIL PO).  Meds ordered this encounter  Medications  . hydrocortisone-pramoxine (PROCTOFOAM HC) rectal foam    Sig: Place 1 applicator rectally 2 (two) times daily.    Dispense:  10 g    Refill:  1     Follow-up: Return if symptoms worsen or fail to improve.  Scarlette Calico, MD

## 2017-09-13 ENCOUNTER — Other Ambulatory Visit: Payer: Self-pay | Admitting: Internal Medicine

## 2017-09-13 DIAGNOSIS — I10 Essential (primary) hypertension: Secondary | ICD-10-CM

## 2017-09-13 DIAGNOSIS — E785 Hyperlipidemia, unspecified: Secondary | ICD-10-CM

## 2017-10-08 ENCOUNTER — Ambulatory Visit: Payer: BLUE CROSS/BLUE SHIELD | Admitting: Family

## 2017-10-08 ENCOUNTER — Encounter: Payer: Self-pay | Admitting: Family

## 2017-10-08 VITALS — BP 160/110 | HR 65 | Temp 99.2°F | Ht 69.0 in | Wt 208.8 lb

## 2017-10-08 DIAGNOSIS — I10 Essential (primary) hypertension: Secondary | ICD-10-CM | POA: Diagnosis not present

## 2017-10-08 DIAGNOSIS — J069 Acute upper respiratory infection, unspecified: Secondary | ICD-10-CM | POA: Diagnosis not present

## 2017-10-08 DIAGNOSIS — E876 Hypokalemia: Secondary | ICD-10-CM | POA: Diagnosis not present

## 2017-10-08 DIAGNOSIS — T502X5A Adverse effect of carbonic-anhydrase inhibitors, benzothiadiazides and other diuretics, initial encounter: Secondary | ICD-10-CM | POA: Diagnosis not present

## 2017-10-08 DIAGNOSIS — I2782 Chronic pulmonary embolism: Secondary | ICD-10-CM

## 2017-10-08 MED ORDER — POTASSIUM CHLORIDE CRYS ER 20 MEQ PO TBCR: 20.0000 meq | EXTENDED_RELEASE_TABLET | Freq: Every day | ORAL | 1 refills | Status: DC

## 2017-10-08 MED ORDER — CHLORTHALIDONE 25 MG PO TABS: 25.0000 mg | ORAL_TABLET | Freq: Every day | ORAL | 1 refills | Status: DC

## 2017-10-08 MED ORDER — FLUTICASONE PROPIONATE 50 MCG/ACT NA SUSP: 2.0000 | Freq: Every day | NASAL | 6 refills | Status: DC

## 2017-10-08 MED ORDER — AZITHROMYCIN 250 MG PO TABS: ORAL_TABLET | ORAL | 0 refills | Status: DC

## 2017-10-08 MED ORDER — RIVAROXABAN 10 MG PO TABS: 10.0000 mg | ORAL_TABLET | Freq: Every day | ORAL | 1 refills | Status: DC

## 2017-10-08 NOTE — Patient Instructions (Signed)
Take your Coreg twice a day until Dr. Ronnald Ramp changes your medication;

## 2017-10-08 NOTE — Progress Notes (Signed)
Willie Foster is a 52 y.o. male with the following history as recorded in Epic[facility]:  Patient Active Problem List   Diagnosis Date Noted  . Hemorrhoids, internal 08/18/2017  . Snoring 07/15/2017  . Seasonal allergic rhinitis due to pollen 01/12/2017  . Elevated serum creatinine 06/15/2016  . Drug-induced erectile dysfunction 02/04/2016  . Routine general medical examination at a health care facility 01/31/2014  . Fatty liver 01/31/2014  . SI (sacroiliac) joint dysfunction 08/15/2013  . Hyperglycemia 01/11/2013  . Obesity (BMI 30-39.9) 12/07/2012  . Pulmonary embolism ([place]) 11/13/2012  . Diuretic-induced hypokalemia 09/16/2012  . BPH (benign prostatic hyperplasia) 09/15/2012  . Essential hypertension, benign 07/20/2012  . Hyperlipidemia with target LDL less than 130 07/20/2012  . Herpes labialis 01/06/2011    Current Outpatient Medications  Medication Sig Dispense Refill  . acyclovir (ZOVIRAX) 400 MG tablet TAKE ONE TABLET BY MOUTH THREE TIMES DAILY AS NEEDED FOR  FEVER  BLISTERS 30 tablet 3  . carvedilol (COREG) 3.125 MG tablet TAKE 1 TABLET BY MOUTH TWICE DAILY WITH A MEAL 180 tablet 1  . Cetirizine HCl (ZYRTEC ALLERGY) 10 MG CAPS Take 10 mg by mouth daily.    . chlorthalidone (HYGROTON) 25 MG tablet Take 1 tablet (25 mg total) by mouth daily. 90 tablet 1  . Cholecalciferol 2000 UNITS CAPS Take 1 capsule (2,000 Units total) by mouth daily. 30 each   . hydrocortisone-pramoxine (PROCTOFOAM HC) rectal foam Place 1 applicator rectally 2 (two) times daily. 10 g 1  . Omega-3 Fatty Acids (FISH OIL PO) Take by mouth.    . potassium chloride SA (KLOR-CON M20) 20 MEQ tablet Take 1 tablet (20 mEq total) by mouth daily. 90 tablet 1  . pravastatin (PRAVACHOL) 40 MG tablet TAKE 1 TABLET BY MOUTH ONCE DAILY 90 tablet 1  . rivaroxaban (XARELTO) 10 MG TABS tablet Take 1 tablet (10 mg total) by mouth daily. 90 tablet 1  . sildenafil (VIAGRA) 100 MG tablet Take 1 tablet (100 mg total) by mouth daily  as needed for erectile dysfunction. 10 tablet 11  . Turmeric 500 MG CAPS Take 1 tablet by mouth 2 (two) times daily.    . vitamin C (ASCORBIC ACID) 500 MG tablet Take 500 mg by mouth daily.    Marland Kitchen azithromycin (ZITHROMAX) 250 MG tablet 2 tabs po qd x 1 day; 1 tablet per day x 4 days; 6 tablet 0  . fluticasone (FLONASE) 50 MCG/ACT nasal spray Place 2 sprays into both nostrils daily. 16 g 6   No current facility-administered medications for this visit.     Allergies: Lipitor [atorvastatin]  Past Medical History:  Diagnosis Date  . Elevated serum creatinine 06/15/2016   1.66 06/02/16  . Fatty liver disease, nonalcoholic 7619  . Hyperlipidemia   . Hypertension   . Leukopenia 01/18/2013   WBC 3,400 01/18/13 was normal 8/22 Xarelto?  . Pulmonary embolism ([place]) 10/2012   Right lung    History reviewed. No pertinent surgical history.  Family History  Problem Relation Age of Onset  . Hypertension Father   . Alcohol abuse Neg Hx   . COPD Neg Hx   . Diabetes Neg Hx   . Drug abuse Neg Hx   . Early death Neg Hx   . Heart disease Neg Hx   . Hyperlipidemia Neg Hx   . Kidney disease Neg Hx   . Stroke Neg Hx   . Lung cancer Father   . Breast cancer Mother  mets to brain    Social History   Tobacco Use  . Smoking status: Never Smoker  . Smokeless tobacco: Never Used  Substance Use Topics  . Alcohol use: Yes    Alcohol/week: 0.0 oz    Comment: occasional-twice a week    Subjective:  Sore throat started suddenly yesterday; +feeling post-nasal drainage; low grade fever; no chest pain or shortness of breath;  Also admits that not taking his blood pressure medication as prescribed; forgetting to take his Coreg twice a day; thinks this and illness are why his pressure is up today; Also needs updated refills for some of his chronic medications;  Objective:  Vitals:   10/08/17 1312  BP: (!) 160/110  Pulse: 65  Temp: 99.2 F (37.3 C)  TempSrc: Oral  SpO2: 97%  Weight: 208 lb 12 oz  (94.7 kg)  Height: 5' 9"  (1.753 m)    General: Well developed, well nourished, in no acute distress  Skin : Warm and dry.  Head: Normocephalic and atraumatic  Eyes: Sclera and conjunctiva clear; pupils round and reactive to light; extraocular movements intact  Ears: External normal; canals clear; tympanic membranes normal  Oropharynx: Pink, supple. No suspicious lesions  Neck: Supple without thyromegaly, adenopathy  Lungs: Respirations unlabored; clear to auscultation bilaterally without wheeze, rales, rhonchi  CVS exam: normal rate and regular rhythm.  Neurologic: Alert and oriented; speech intact; face symmetrical; moves all extremities well; CNII-XII intact without focal deficit  Assessment:  1. Acute URI   2. Essential hypertension, benign   3. Other chronic pulmonary embolism without acute cor pulmonale (HCC)   4. Diuretic-induced hypokalemia     Plan:  1. Suspect allergy component; Rx for Z-pak and Flonase; follow-up worse, no better. 2. ? Control; will check with his PCP to see about qd option for beta blocker; follow-up to be determined; 3. Stable; refill on Xarelto; 4. Refill on potassium; potassium normal in 06/2017;   No follow-ups on file.  No orders of the defined types were placed in this encounter.   Requested Prescriptions   Signed Prescriptions Disp Refills  . chlorthalidone (HYGROTON) 25 MG tablet 90 tablet 1    Sig: Take 1 tablet (25 mg total) by mouth daily.  . rivaroxaban (XARELTO) 10 MG TABS tablet 90 tablet 1    Sig: Take 1 tablet (10 mg total) by mouth daily.  . potassium chloride SA (KLOR-CON M20) 20 MEQ tablet 90 tablet 1    Sig: Take 1 tablet (20 mEq total) by mouth daily.  . fluticasone (FLONASE) 50 MCG/ACT nasal spray 16 g 6    Sig: Place 2 sprays into both nostrils daily.  Marland Kitchen azithromycin (ZITHROMAX) 250 MG tablet 6 tablet 0    Sig: 2 tabs po qd x 1 day; 1 tablet per day x 4 days;

## 2017-10-27 ENCOUNTER — Encounter: Payer: Self-pay | Admitting: Family

## 2017-10-27 ENCOUNTER — Ambulatory Visit: Payer: BLUE CROSS/BLUE SHIELD | Admitting: Family

## 2017-10-27 ENCOUNTER — Ambulatory Visit: Payer: BLUE CROSS/BLUE SHIELD | Admitting: Internal Medicine

## 2017-10-27 VITALS — BP 132/80 | HR 58 | Temp 98.4°F | Ht 69.0 in | Wt 206.0 lb

## 2017-10-27 DIAGNOSIS — B009 Herpesviral infection, unspecified: Secondary | ICD-10-CM

## 2017-10-27 DIAGNOSIS — Z0289 Encounter for other administrative examinations: Secondary | ICD-10-CM

## 2017-10-27 DIAGNOSIS — J9801 Acute bronchospasm: Secondary | ICD-10-CM

## 2017-10-27 MED ORDER — VALACYCLOVIR HCL 1 G PO TABS: 2000.0000 mg | ORAL_TABLET | Freq: Two times a day (BID) | ORAL | 0 refills | Status: DC

## 2017-10-27 MED ORDER — METHYLPREDNISOLONE 4 MG PO TBPK: ORAL_TABLET | ORAL | 0 refills | Status: DC

## 2017-10-27 NOTE — Progress Notes (Signed)
Willie Foster is a 52 y.o. male with the following history as recorded in EpicCare:  Patient Active Problem List   Diagnosis Date Noted  . Hemorrhoids, internal 08/18/2017  . Snoring 07/15/2017  . Seasonal allergic rhinitis due to pollen 01/12/2017  . Elevated serum creatinine 06/15/2016  . Drug-induced erectile dysfunction 02/04/2016  . Routine general medical examination at a health care facility 01/31/2014  . Fatty liver 01/31/2014  . SI (sacroiliac) joint dysfunction 08/15/2013  . Hyperglycemia 01/11/2013  . Obesity (BMI 30-39.9) 12/07/2012  . Pulmonary embolism ([place]) 11/13/2012  . Diuretic-induced hypokalemia 09/16/2012  . BPH (benign prostatic hyperplasia) 09/15/2012  . Essential hypertension, benign 07/20/2012  . Hyperlipidemia with target LDL less than 130 07/20/2012  . Herpes labialis 01/06/2011    Current Outpatient Medications  Medication Sig Dispense Refill  . acyclovir (ZOVIRAX) 400 MG tablet TAKE ONE TABLET BY MOUTH THREE TIMES DAILY AS NEEDED FOR  FEVER  BLISTERS 30 tablet 3  . carvedilol (COREG) 3.125 MG tablet TAKE 1 TABLET BY MOUTH TWICE DAILY WITH A MEAL 180 tablet 1  . Cetirizine HCl (ZYRTEC ALLERGY) 10 MG CAPS Take 10 mg by mouth daily.    . chlorthalidone (HYGROTON) 25 MG tablet Take 1 tablet (25 mg total) by mouth daily. 90 tablet 1  . Cholecalciferol 2000 UNITS CAPS Take 1 capsule (2,000 Units total) by mouth daily. 30 each   . fluticasone (FLONASE) 50 MCG/ACT nasal spray Place 2 sprays into both nostrils daily. 16 g 6  . hydrocortisone-pramoxine (PROCTOFOAM HC) rectal foam Place 1 applicator rectally 2 (two) times daily. 10 g 1  . methylPREDNISolone (MEDROL DOSEPAK) 4 MG TBPK tablet Taper as directed 21 tablet 0  . Omega-3 Fatty Acids (FISH OIL PO) Take by mouth.    . potassium chloride SA (KLOR-CON M20) 20 MEQ tablet Take 1 tablet (20 mEq total) by mouth daily. 90 tablet 1  . pravastatin (PRAVACHOL) 40 MG tablet TAKE 1 TABLET BY MOUTH ONCE DAILY 90 tablet  1  . rivaroxaban (XARELTO) 10 MG TABS tablet Take 1 tablet (10 mg total) by mouth daily. 90 tablet 1  . sildenafil (VIAGRA) 100 MG tablet Take 1 tablet (100 mg total) by mouth daily as needed for erectile dysfunction. 10 tablet 11  . Turmeric 500 MG CAPS Take 1 tablet by mouth 2 (two) times daily.    . valACYclovir (VALTREX) 1000 MG tablet Take 2 tablets (2,000 mg total) by mouth 2 (two) times daily. Use x 1 day as directed for outbreak 30 tablet 0  . vitamin C (ASCORBIC ACID) 500 MG tablet Take 500 mg by mouth daily.     No current facility-administered medications for this visit.     Allergies: Lipitor [atorvastatin]  Past Medical History:  Diagnosis Date  . Elevated serum creatinine 06/15/2016   1.66 06/02/16  . Fatty liver disease, nonalcoholic 6759  . Hyperlipidemia   . Hypertension   . Leukopenia 01/18/2013   WBC 3,400 01/18/13 was normal 8/22 Xarelto?  . Pulmonary embolism ([place]) 10/2012   Right lung    History reviewed. No pertinent surgical history.  Family History  Problem Relation Age of Onset  . Hypertension Father   . Alcohol abuse Neg Hx   . COPD Neg Hx   . Diabetes Neg Hx   . Drug abuse Neg Hx   . Early death Neg Hx   . Heart disease Neg Hx   . Hyperlipidemia Neg Hx   . Kidney disease Neg Hx   . Stroke  Neg Hx   . Lung cancer Father   . Breast cancer Mother        mets to brain    Social History   Tobacco Use  . Smoking status: Never Smoker  . Smokeless tobacco: Never Used  Substance Use Topics  . Alcohol use: Yes    Alcohol/week: 0.0 oz    Comment: occasional-twice a week    Subjective:  Patient was seen on 10/08/2017 with concerns for acute bronchitis. Was treated with Z-pak with some benefit; Just feels like there is lingering congestion/ inflammation; not currently using the Flonase that was prescribed at last office visit; no chest pain or shortness of breath; will be traveling to [place] soon;  Also would like to discuss alternative to Acyclovir for cold  sores; does not feel as effective;   Objective:  Vitals:   10/27/17 1545  BP: 132/80  Pulse: (!) 58  Temp: 98.4 F (36.9 C)  TempSrc: Oral  SpO2: 97%  Weight: 206 lb (93.4 kg)  Height: 5' 9"  (1.753 m)    General: Well developed, well nourished, in no acute distress  Skin : Warm and dry.  Head: Normocephalic and atraumatic  Eyes: Sclera and conjunctiva clear; pupils round and reactive to light; extraocular movements intact  Ears: External normal; canals clear; tympanic membranes normal  Oropharynx: Pink, supple. No suspicious lesions  Neck: Supple without thyromegaly, adenopathy  Lungs: Respirations unlabored; clear to auscultation bilaterally without wheeze, rales, rhonchi  CVS exam: normal rate and regular rhythm.  Neurologic: Alert and oriented; speech intact; face symmetrical; moves all extremities well; CNII-XII intact without focal deficit   Assessment:  1. Cough due to bronchospasm   2. HSV-1 infection     Plan:  1. Rx for Medrol Dose Pak; follow-up worse, no better; 2. Trial of Valtrex 1 gm 2 po bid x 1 day for outbreak;   Medication review done with patient and medication list is up to date;     No follow-ups on file.  No orders of the defined types were placed in this encounter.   Requested Prescriptions   Signed Prescriptions Disp Refills  . valACYclovir (VALTREX) 1000 MG tablet 30 tablet 0    Sig: Take 2 tablets (2,000 mg total) by mouth 2 (two) times daily. Use x 1 day as directed for outbreak  . methylPREDNISolone (MEDROL DOSEPAK) 4 MG TBPK tablet 21 tablet 0    Sig: Taper as directed

## 2017-12-01 ENCOUNTER — Telehealth: Payer: Self-pay | Admitting: Internal Medicine

## 2017-12-01 DIAGNOSIS — S99829A Other specified injuries of unspecified foot, initial encounter: Secondary | ICD-10-CM

## 2017-12-01 NOTE — Telephone Encounter (Signed)
Copied from [place] [id number]. Topic: Referral - Request >> Dec 01, 2017  8:38 AM Margot Ables wrote: Reason for CRM: pt is wanting referral to podiatry in [place]. He gets manicures and pedicures and for a while he has had a split in toenail on big toe. It is getting worse and going backwards. Pt has a photo of this that was taken last night but the salon has been applying acrylic to cover it to look normal. Pt wanting to know if copy of photo is needed and if he can have referral.

## 2017-12-01 NOTE — Telephone Encounter (Signed)
Referral has been entered. Can you inform patient of same.

## 2017-12-02 NOTE — Telephone Encounter (Signed)
Referral sent to [facility] and pt is aware

## 2017-12-02 NOTE — Telephone Encounter (Signed)
Informed patient. Would like to know if this could be entered as soon as possible.

## 2017-12-10 ENCOUNTER — Ambulatory Visit: Payer: BLUE CROSS/BLUE SHIELD | Admitting: Podiatry

## 2017-12-10 ENCOUNTER — Encounter: Payer: Self-pay | Admitting: Podiatry

## 2017-12-10 VITALS — BP 124/79 | HR 65 | Resp 16

## 2017-12-10 DIAGNOSIS — B351 Tinea unguium: Secondary | ICD-10-CM

## 2017-12-10 DIAGNOSIS — L603 Nail dystrophy: Secondary | ICD-10-CM

## 2017-12-10 MED ORDER — FLUCONAZOLE 150 MG PO TABS: 150.0000 mg | ORAL_TABLET | ORAL | 1 refills | Status: DC

## 2017-12-12 NOTE — Progress Notes (Signed)
Subjective:  Patient ID: Willie Foster, male    DOB: [birth year],  MRN: [id number]  Chief Complaint  Patient presents with  . Nail Problem    Hallux nail right - brittle, thickened, discolored, cracked nail x 6 months, getting pedicures regularly - usually gets acrylic to cover  . New Patient (Initial Visit)    52 y.o. male presents with the above complaint.  Reports brittle thickened discoloration to the right great toenail.  Has not for the past 6 months.  Gets routine pedicures usually has been putting an acrylic nail on top of it. Past Medical History:  Diagnosis Date  . Elevated serum creatinine 06/15/2016   1.66 06/02/16  . Fatty liver disease, nonalcoholic 2694  . Hyperlipidemia   . Hypertension   . Leukopenia 01/18/2013   WBC 3,400 01/18/13 was normal 8/22 Xarelto?  . Pulmonary embolism ([place]) 10/2012   Right lung   No past surgical history on file.  Current Outpatient Medications:  .  acyclovir (ZOVIRAX) 400 MG tablet, TAKE ONE TABLET BY MOUTH THREE TIMES DAILY AS NEEDED FOR  FEVER  BLISTERS, Disp: 30 tablet, Rfl: 3 .  carvedilol (COREG) 3.125 MG tablet, TAKE 1 TABLET BY MOUTH TWICE DAILY WITH A MEAL, Disp: 180 tablet, Rfl: 1 .  Cetirizine HCl (ZYRTEC ALLERGY) 10 MG CAPS, Take 10 mg by mouth daily., Disp: , Rfl:  .  chlorthalidone (HYGROTON) 25 MG tablet, Take 1 tablet (25 mg total) by mouth daily., Disp: 90 tablet, Rfl: 1 .  Cholecalciferol 2000 UNITS CAPS, Take 1 capsule (2,000 Units total) by mouth daily., Disp: 30 each, Rfl:  .  fluconazole (DIFLUCAN) 150 MG tablet, Take 1 tablet (150 mg total) by mouth once a week., Disp: 6 tablet, Rfl: 1 .  fluticasone (FLONASE) 50 MCG/ACT nasal spray, Place 2 sprays into both nostrils daily., Disp: 16 g, Rfl: 6 .  hydrocortisone-pramoxine (PROCTOFOAM HC) rectal foam, Place 1 applicator rectally 2 (two) times daily., Disp: 10 g, Rfl: 1 .  Omega-3 Fatty Acids (FISH OIL PO), Take by mouth., Disp: , Rfl:  .  potassium chloride SA (KLOR-CON  M20) 20 MEQ tablet, Take 1 tablet (20 mEq total) by mouth daily., Disp: 90 tablet, Rfl: 1 .  pravastatin (PRAVACHOL) 40 MG tablet, TAKE 1 TABLET BY MOUTH ONCE DAILY, Disp: 90 tablet, Rfl: 1 .  rivaroxaban (XARELTO) 10 MG TABS tablet, Take 1 tablet (10 mg total) by mouth daily., Disp: 90 tablet, Rfl: 1 .  sildenafil (VIAGRA) 100 MG tablet, Take 1 tablet (100 mg total) by mouth daily as needed for erectile dysfunction., Disp: 10 tablet, Rfl: 11 .  Turmeric 500 MG CAPS, Take 1 tablet by mouth 2 (two) times daily., Disp: , Rfl:  .  valACYclovir (VALTREX) 1000 MG tablet, Take 2 tablets (2,000 mg total) by mouth 2 (two) times daily. Use x 1 day as directed for outbreak, Disp: 30 tablet, Rfl: 0 .  vitamin C (ASCORBIC ACID) 500 MG tablet, Take 500 mg by mouth daily., Disp: , Rfl:   Allergies  Allergen Reactions  . Lipitor [Atorvastatin]     Elevated LFT's   Review of Systems: Negative except as noted in the HPI. Denies N/V/F/Ch. Objective:   Vitals:   12/10/17 0830  BP: 124/79  Pulse: 65  Resp: 16   General AA&O x3. Normal mood and affect.  Vascular Dorsalis pedis and posterior tibial pulses  present 2+ bilaterally  Capillary refill normal to all digits. Pedal hair growth normal.  Neurologic Epicritic sensation grossly present.  Dermatologic No open lesions. Interspaces clear of maceration. Right hallux nail thickened dystrophic slight brown discoloration, ridging  Orthopedic: MMT 5/5 in dorsiflexion, plantarflexion, inversion, and eversion. Normal joint ROM without pain or crepitus.   Assessment & Plan:  Patient was evaluated and treated and all questions answered.  Onychomycosis -Educated on etiology of nail fungus. -Nail sample taken for microbiology and histology. -Rx Fluconazole weekly.  Return in about 5 weeks (around 01/14/2018) for Nail Fungus.

## 2017-12-27 DIAGNOSIS — H25013 Cortical age-related cataract, bilateral: Secondary | ICD-10-CM | POA: Diagnosis not present

## 2018-01-14 ENCOUNTER — Ambulatory Visit: Payer: BLUE CROSS/BLUE SHIELD | Admitting: Podiatry

## 2018-01-14 DIAGNOSIS — L603 Nail dystrophy: Secondary | ICD-10-CM | POA: Diagnosis not present

## 2018-01-14 DIAGNOSIS — B351 Tinea unguium: Secondary | ICD-10-CM

## 2018-01-14 DIAGNOSIS — Z79899 Other long term (current) drug therapy: Secondary | ICD-10-CM

## 2018-01-14 LAB — HEPATIC FUNCTION PANEL
AG Ratio: 1.9 (calc) (ref 1.0–2.5)
ALBUMIN MSPROF: 4.5 g/dL (ref 3.6–5.1)
ALT: 37 U/L (ref 9–46)
AST: 34 U/L (ref 10–35)
Alkaline phosphatase (APISO): 58 U/L (ref 40–115)
Bilirubin, Direct: 0.1 mg/dL (ref 0.0–0.2)
Globulin: 2.4 g/dL (calc) (ref 1.9–3.7)
Indirect Bilirubin: 0.3 mg/dL (calc) (ref 0.2–1.2)
TOTAL PROTEIN: 6.9 g/dL (ref 6.1–8.1)
Total Bilirubin: 0.4 mg/dL (ref 0.2–1.2)

## 2018-01-14 MED ORDER — TERBINAFINE HCL 250 MG PO TABS: 250.0000 mg | ORAL_TABLET | Freq: Every day | ORAL | 0 refills | Status: AC

## 2018-01-14 NOTE — Progress Notes (Signed)
Subjective:  Patient ID: Willie Foster, male    DOB: 1966-02-27,  MRN: [id number]  Chief Complaint  Patient presents with  . Nail Problem    nail dystrophy - [place] results - doing well    52 y.o. male presents with the above complaint. Using fluconazole only has one tablet left. Here for review of nail sample.  Review of Systems: Negative except as noted in the HPI. Denies N/V/F/Ch.  Past Medical History:  Diagnosis Date  . Elevated serum creatinine 06/15/2016   1.66 06/02/16  . Fatty liver disease, nonalcoholic 6283  . Hyperlipidemia   . Hypertension   . Leukopenia 01/18/2013   WBC 3,400 01/18/13 was normal 8/22 Xarelto?  . Pulmonary embolism ([place]) 10/2012   Right lung    Current Outpatient Medications:  .  acyclovir (ZOVIRAX) 400 MG tablet, TAKE ONE TABLET BY MOUTH THREE TIMES DAILY AS NEEDED FOR  FEVER  BLISTERS, Disp: 30 tablet, Rfl: 3 .  carvedilol (COREG) 3.125 MG tablet, TAKE 1 TABLET BY MOUTH TWICE DAILY WITH A MEAL, Disp: 180 tablet, Rfl: 1 .  Cetirizine HCl (ZYRTEC ALLERGY) 10 MG CAPS, Take 10 mg by mouth daily., Disp: , Rfl:  .  chlorthalidone (HYGROTON) 25 MG tablet, Take 1 tablet (25 mg total) by mouth daily., Disp: 90 tablet, Rfl: 1 .  Cholecalciferol 2000 UNITS CAPS, Take 1 capsule (2,000 Units total) by mouth daily., Disp: 30 each, Rfl:  .  fluconazole (DIFLUCAN) 150 MG tablet, Take 1 tablet (150 mg total) by mouth once a week., Disp: 6 tablet, Rfl: 1 .  fluticasone (FLONASE) 50 MCG/ACT nasal spray, Place 2 sprays into both nostrils daily., Disp: 16 g, Rfl: 6 .  hydrocortisone-pramoxine (PROCTOFOAM HC) rectal foam, Place 1 applicator rectally 2 (two) times daily., Disp: 10 g, Rfl: 1 .  Omega-3 Fatty Acids (FISH OIL PO), Take by mouth., Disp: , Rfl:  .  potassium chloride SA (KLOR-CON M20) 20 MEQ tablet, Take 1 tablet (20 mEq total) by mouth daily., Disp: 90 tablet, Rfl: 1 .  pravastatin (PRAVACHOL) 40 MG tablet, TAKE 1 TABLET BY MOUTH ONCE DAILY, Disp: 90 tablet, Rfl:  1 .  rivaroxaban (XARELTO) 10 MG TABS tablet, Take 1 tablet (10 mg total) by mouth daily., Disp: 90 tablet, Rfl: 1 .  sildenafil (VIAGRA) 100 MG tablet, Take 1 tablet (100 mg total) by mouth daily as needed for erectile dysfunction., Disp: 10 tablet, Rfl: 11 .  terbinafine (LAMISIL) 250 MG tablet, Take 1 tablet (250 mg total) by mouth daily., Disp: 30 tablet, Rfl: 0 .  Turmeric 500 MG CAPS, Take 1 tablet by mouth 2 (two) times daily., Disp: , Rfl:  .  valACYclovir (VALTREX) 1000 MG tablet, Take 2 tablets (2,000 mg total) by mouth 2 (two) times daily. Use x 1 day as directed for outbreak, Disp: 30 tablet, Rfl: 0 .  vitamin C (ASCORBIC ACID) 500 MG tablet, Take 500 mg by mouth daily., Disp: , Rfl:   Social History   Tobacco Use  Smoking Status Never Smoker  Smokeless Tobacco Never Used    Allergies  Allergen Reactions  . Lipitor [Atorvastatin]     Elevated LFT's   Objective:  There were no vitals filed for this visit. There is no height or weight on file to calculate BMI. Constitutional Well developed. Well nourished.  Vascular Dorsalis pedis pulses palpable bilaterally. Posterior tibial pulses palpable bilaterally. Capillary refill normal to all digits.  No cyanosis or clubbing noted. Pedal hair growth normal.  Neurologic Normal speech.  Oriented to person, place, and time. Epicritic sensation to light touch grossly present bilaterally.  Dermatologic R hallux nail thickened dystrophic distally. No open wounds. No skin lesions.  Orthopedic: Normal joint ROM without pain or crepitus bilaterally. No visible deformities. No bony tenderness.   Radiographs: None Assessment:   1. Encounter for long-term (current) use of high-risk medication   2. Onychomycosis   3. Nail dystrophy    Plan:  Patient was evaluated and treated and all questions answered.  Onychomycosis -Educated on etiology of nail fungus. -Nail histology reviewed - T [place] noted. -Baseline liver function  studies ordered. Will d/c terbinafine if elevated during therapy. -eRx for oral terbinafine #30. Educated on risks and benefits of the medication.  Return in about 5 weeks (around 02/18/2018) for nail fungus.

## 2018-01-30 ENCOUNTER — Other Ambulatory Visit: Payer: Self-pay | Admitting: Podiatry

## 2018-02-07 ENCOUNTER — Ambulatory Visit: Payer: BLUE CROSS/BLUE SHIELD | Admitting: Nurse Practitioner

## 2018-02-07 ENCOUNTER — Encounter: Payer: Self-pay | Admitting: Nurse Practitioner

## 2018-02-07 VITALS — BP 124/80 | HR 71 | Temp 98.8°F | Ht 69.0 in | Wt 203.0 lb

## 2018-02-07 DIAGNOSIS — J329 Chronic sinusitis, unspecified: Secondary | ICD-10-CM | POA: Diagnosis not present

## 2018-02-07 DIAGNOSIS — J310 Chronic rhinitis: Secondary | ICD-10-CM

## 2018-02-07 MED ORDER — FLUTICASONE PROPIONATE 50 MCG/ACT NA SUSP: 2.0000 | Freq: Every day | NASAL | 6 refills | Status: DC

## 2018-02-07 MED ORDER — AMOXICILLIN-POT CLAVULANATE 875-125 MG PO TABS: 1.0000 | ORAL_TABLET | Freq: Two times a day (BID) | ORAL | 0 refills | Status: DC

## 2018-02-07 NOTE — Patient Instructions (Signed)
Please start augmentin twice daily for 5 days for your symptoms  Please follow up for fevers >101, worsening symptoms or if symptoms persist after antibiotics.   Sinusitis, Adult Sinusitis is soreness and inflammation of your sinuses. Sinuses are hollow spaces in the bones around your face. They are located:  Around your eyes.  In the middle of your forehead.  Behind your nose.  In your cheekbones.  Your sinuses and nasal passages are lined with a stringy fluid (mucus). Mucus normally drains out of your sinuses. When your nasal tissues get inflamed or swollen, the mucus can get trapped or blocked so air cannot flow through your sinuses. This lets bacteria, viruses, and funguses grow, and that leads to infection. Follow these instructions at home: Medicines  Take, use, or apply over-the-counter and prescription medicines only as told by your doctor. These may include nasal sprays.  If you were prescribed an antibiotic medicine, take it as told by your doctor. Do not stop taking the antibiotic even if you start to feel better. Hydrate and Humidify  Drink enough water to keep your pee (urine) clear or pale yellow.  Use a cool mist humidifier to keep the humidity level in your home above 50%.  Breathe in steam for 10-15 minutes, 3-4 times a day or as told by your doctor. You can do this in the bathroom while a hot shower is running.  Try not to spend time in cool or dry air. Rest  Rest as much as possible.  Sleep with your head raised (elevated).  Make sure to get enough sleep each night. General instructions  Put a warm, moist washcloth on your face 3-4 times a day or as told by your doctor. This will help with discomfort.  Wash your hands often with soap and water. If there is no soap and water, use hand sanitizer.  Do not smoke. Avoid being around people who are smoking (secondhand smoke).  Keep all follow-up visits as told by your doctor. This is important. Contact a  doctor if:  You have a fever.  Your symptoms get worse.  Your symptoms do not get better within 10 days. Get help right away if:  You have a very bad headache.  You cannot stop throwing up (vomiting).  You have pain or swelling around your face or eyes.  You have trouble seeing.  You feel confused.  Your neck is stiff.  You have trouble breathing. This information is not intended to replace advice given to you by your health care provider. Make sure you discuss any questions you have with your health care provider. Document Released: 10/28/2007 Document Revised: 01/05/2016 Document Reviewed: 03/06/2015 Elsevier Interactive Patient Education  [employer].

## 2018-02-07 NOTE — Progress Notes (Addendum)
Name: Willie Foster   MRN: [id number]    DOB: Dec 29, 1965   Date:02/07/2018       Progress Note  Subjective  Chief Complaint Cold symptoms  HPI Willie Foster is here today for evaluation of an acute complaint of nasal congestion, rhinorrhea, symptoms began about 1 week ago. He reports initially had sore throat, malaise as well which have gotten better but nasal congestion, rhinorrhea have persisted, now having clear to yellow drainage from his nose. He denies fevers, chills, cough, chest pain, shortness of breath, abdominal pain, [place], vomiting, diarrhea. He has tried mucinex, sudafed, Human resources officer for his symptoms with no relief. He is requesting antibiotics today, says he is going out of town this weekend and doesnot want to be sick, and asking for something other than z-pak, as this has not helped for similar symptoms in the past.    Patient Active Problem List   Diagnosis Date Noted  . Hemorrhoids, internal 08/18/2017  . Snoring 07/15/2017  . Seasonal allergic rhinitis due to pollen 01/12/2017  . Elevated serum creatinine 06/15/2016  . Drug-induced erectile dysfunction 02/04/2016  . Routine general medical examination at a health care facility 01/31/2014  . Fatty liver 01/31/2014  . SI (sacroiliac) joint dysfunction 08/15/2013  . Hyperglycemia 01/11/2013  . Obesity (BMI 30-39.9) 12/07/2012  . Pulmonary embolism ([place]) 11/13/2012  . Diuretic-induced hypokalemia 09/16/2012  . BPH (benign prostatic hyperplasia) 09/15/2012  . Essential hypertension, benign 07/20/2012  . Hyperlipidemia with target LDL less than 130 07/20/2012  . Herpes labialis 01/06/2011    Social History   Tobacco Use  . Smoking status: Never Smoker  . Smokeless tobacco: Never Used  Substance Use Topics  . Alcohol use: Yes    Alcohol/week: 0.0 standard drinks    Comment: occasional-twice a week     Current Outpatient Medications:  .  acyclovir (ZOVIRAX) 400 MG tablet, TAKE ONE TABLET BY MOUTH THREE TIMES DAILY AS  NEEDED FOR  FEVER  BLISTERS, Disp: 30 tablet, Rfl: 3 .  carvedilol (COREG) 3.125 MG tablet, TAKE 1 TABLET BY MOUTH TWICE DAILY WITH A MEAL, Disp: 180 tablet, Rfl: 1 .  Cetirizine HCl (ZYRTEC ALLERGY) 10 MG CAPS, Take 10 mg by mouth daily., Disp: , Rfl:  .  chlorthalidone (HYGROTON) 25 MG tablet, Take 1 tablet (25 mg total) by mouth daily., Disp: 90 tablet, Rfl: 1 .  Cholecalciferol 2000 UNITS CAPS, Take 1 capsule (2,000 Units total) by mouth daily., Disp: 30 each, Rfl:  .  fluconazole (DIFLUCAN) 150 MG tablet, Take 1 tablet (150 mg total) by mouth once a week., Disp: 6 tablet, Rfl: 1 .  fluticasone (FLONASE) 50 MCG/ACT nasal spray, Place 2 sprays into both nostrils daily., Disp: 16 g, Rfl: 6 .  hydrocortisone-pramoxine (PROCTOFOAM HC) rectal foam, Place 1 applicator rectally 2 (two) times daily., Disp: 10 g, Rfl: 1 .  Omega-3 Fatty Acids (FISH OIL PO), Take by mouth., Disp: , Rfl:  .  potassium chloride SA (KLOR-CON M20) 20 MEQ tablet, Take 1 tablet (20 mEq total) by mouth daily., Disp: 90 tablet, Rfl: 1 .  pravastatin (PRAVACHOL) 40 MG tablet, TAKE 1 TABLET BY MOUTH ONCE DAILY, Disp: 90 tablet, Rfl: 1 .  rivaroxaban (XARELTO) 10 MG TABS tablet, Take 1 tablet (10 mg total) by mouth daily., Disp: 90 tablet, Rfl: 1 .  sildenafil (VIAGRA) 100 MG tablet, Take 1 tablet (100 mg total) by mouth daily as needed for erectile dysfunction., Disp: 10 tablet, Rfl: 11 .  terbinafine (LAMISIL) 250 MG tablet, Take  1 tablet (250 mg total) by mouth daily., Disp: 30 tablet, Rfl: 0 .  Turmeric 500 MG CAPS, Take 1 tablet by mouth 2 (two) times daily., Disp: , Rfl:  .  valACYclovir (VALTREX) 1000 MG tablet, Take 2 tablets (2,000 mg total) by mouth 2 (two) times daily. Use x 1 day as directed for outbreak, Disp: 30 tablet, Rfl: 0 .  vitamin C (ASCORBIC ACID) 500 MG tablet, Take 500 mg by mouth daily., Disp: , Rfl:   Allergies  Allergen Reactions  . Lipitor [Atorvastatin]     Elevated LFT's    ROS  No other  specific complaints in a complete review of systems (except as listed in HPI above).  Objective  Vitals:   02/07/18 1318  BP: 124/80  Pulse: 71  Temp: 98.8 F (37.1 C)  TempSrc: Oral  SpO2: 98%  Weight: 203 lb (92.1 kg)  Height: 5' 9"  (1.753 m)    Body mass index is 29.98 kg/m.  Nursing Note and Vital Signs reviewed.  Physical Exam  Constitutional: Patient appears well-developed and well-nourished.  No distress.  HEENT: head atraumatic, normocephalic, pupils equal and reactive to light, EOM's intact, TM's bulging bilaterally, without erythema,  no maxillary or frontal sinus tenderness , neck supple without lymphadenopathy, oropharynx pink and moist without exudate Cardiovascular: Normal rate, regular rhythm, distal pulses intact Pulmonary/Chest: Effort normal and breath sounds clear. No respiratory distress or retractions. Neurological: he is alert and oriented to person, place, and time. No cranial nerve deficit. Coordination, balance, strength, speech and gait are normal.  Skin: Skin is warm and dry. No rash noted. No erythema.  Psychiatric: Patient has a normal mood and affect. behavior is normal. Judgment and thought content normal.   Assessment & Plan  1. Rhinosinusitis Due to duration of symptoms and patient request, will start course of augmentin- dosing and side effects discussed Also discussed use of flonase for his symptoms, this is on his  Medication list, refill sent at his request -Red flags and when to present for emergency care or RTC including fever >101.53F, chest pain, shortness of breath, new/worsening/un-resolving symptoms, reviewed with patient at time of visit. Follow up and care instructions discussed and provided in AVS. - amoxicillin-clavulanate (AUGMENTIN) 875-125 MG tablet; Take 1 tablet by mouth 2 (two) times daily.  Dispense: 10 tablet; Refill: 0

## 2018-02-16 ENCOUNTER — Encounter: Payer: BLUE CROSS/BLUE SHIELD | Admitting: Internal Medicine

## 2018-02-18 ENCOUNTER — Encounter: Payer: Self-pay | Admitting: Podiatry

## 2018-02-18 ENCOUNTER — Ambulatory Visit: Payer: BLUE CROSS/BLUE SHIELD | Admitting: Podiatry

## 2018-02-18 DIAGNOSIS — L603 Nail dystrophy: Secondary | ICD-10-CM | POA: Diagnosis not present

## 2018-02-18 DIAGNOSIS — B351 Tinea unguium: Secondary | ICD-10-CM

## 2018-02-18 DIAGNOSIS — Z79899 Other long term (current) drug therapy: Secondary | ICD-10-CM

## 2018-02-18 LAB — HEPATIC FUNCTION PANEL
AG Ratio: 1.9 (calc) (ref 1.0–2.5)
ALBUMIN MSPROF: 4.5 g/dL (ref 3.6–5.1)
ALT: 31 U/L (ref 9–46)
AST: 37 U/L — AB (ref 10–35)
Alkaline phosphatase (APISO): 62 U/L (ref 40–115)
BILIRUBIN DIRECT: 0.1 mg/dL (ref 0.0–0.2)
Globulin: 2.4 g/dL (calc) (ref 1.9–3.7)
Indirect Bilirubin: 0.5 mg/dL (calc) (ref 0.2–1.2)
Total Bilirubin: 0.6 mg/dL (ref 0.2–1.2)
Total Protein: 6.9 g/dL (ref 6.1–8.1)

## 2018-02-18 MED ORDER — TERBINAFINE HCL 250 MG PO TABS: 250.0000 mg | ORAL_TABLET | Freq: Every day | ORAL | 0 refills | Status: DC

## 2018-02-18 NOTE — Progress Notes (Signed)
Subjective:  Patient ID: Willie Foster, male    DOB: 03/10/1966,  MRN: [id number]  Chief Complaint  Patient presents with  . Nail Problem    Right foot toenail fungus, medication is working and effective    52 y.o. male presents with the above complaint. Thinks the medicine is working. Denies issues with the medication.   Review of Systems: Negative except as noted in the HPI. Denies N/V/F/Ch.  Past Medical History:  Diagnosis Date  . Elevated serum creatinine 06/15/2016   1.66 06/02/16  . Fatty liver disease, nonalcoholic 9798  . Hyperlipidemia   . Hypertension   . Leukopenia 01/18/2013   WBC 3,400 01/18/13 was normal 8/22 Xarelto?  . Pulmonary embolism ([place]) 10/2012   Right lung    Current Outpatient Medications:  .  acyclovir (ZOVIRAX) 400 MG tablet, TAKE ONE TABLET BY MOUTH THREE TIMES DAILY AS NEEDED FOR  FEVER  BLISTERS, Disp: 30 tablet, Rfl: 3 .  amoxicillin-clavulanate (AUGMENTIN) 875-125 MG tablet, Take 1 tablet by mouth 2 (two) times daily., Disp: 10 tablet, Rfl: 0 .  atenolol-chlorthalidone (TENORETIC) 50-25 MG tablet, Take by mouth., Disp: , Rfl:  .  carvedilol (COREG) 3.125 MG tablet, TAKE 1 TABLET BY MOUTH TWICE DAILY WITH A MEAL, Disp: 180 tablet, Rfl: 1 .  Cetirizine HCl (ZYRTEC ALLERGY) 10 MG CAPS, Take 10 mg by mouth daily., Disp: , Rfl:  .  chlorthalidone (HYGROTON) 25 MG tablet, Take 1 tablet (25 mg total) by mouth daily., Disp: 90 tablet, Rfl: 1 .  Cholecalciferol 2000 UNITS CAPS, Take 1 capsule (2,000 Units total) by mouth daily., Disp: 30 each, Rfl:  .  fluticasone (FLONASE) 50 MCG/ACT nasal spray, Place 2 sprays into both nostrils daily., Disp: 16 g, Rfl: 6 .  hydrocortisone-pramoxine (PROCTOFOAM HC) rectal foam, Place 1 applicator rectally 2 (two) times daily., Disp: 10 g, Rfl: 1 .  Omega-3 Fatty Acids (FISH OIL PO), Take by mouth., Disp: , Rfl:  .  potassium chloride SA (KLOR-CON M20) 20 MEQ tablet, Take 1 tablet (20 mEq total) by mouth daily., Disp: 90  tablet, Rfl: 1 .  pravastatin (PRAVACHOL) 40 MG tablet, TAKE 1 TABLET BY MOUTH ONCE DAILY, Disp: 90 tablet, Rfl: 1 .  rivaroxaban (XARELTO) 10 MG TABS tablet, Take 1 tablet (10 mg total) by mouth daily., Disp: 90 tablet, Rfl: 1 .  sildenafil (VIAGRA) 100 MG tablet, Take 1 tablet (100 mg total) by mouth daily as needed for erectile dysfunction., Disp: 10 tablet, Rfl: 11 .  terbinafine (LAMISIL) 250 MG tablet, Take 1 tablet (250 mg total) by mouth daily., Disp: 30 tablet, Rfl: 0 .  Turmeric 500 MG CAPS, Take 1 tablet by mouth 2 (two) times daily., Disp: , Rfl:  .  valACYclovir (VALTREX) 1000 MG tablet, Take 2 tablets (2,000 mg total) by mouth 2 (two) times daily. Use x 1 day as directed for outbreak, Disp: 30 tablet, Rfl: 0 .  vitamin C (ASCORBIC ACID) 500 MG tablet, Take 500 mg by mouth daily., Disp: , Rfl:   Social History   Tobacco Use  Smoking Status Never Smoker  Smokeless Tobacco Never Used    Allergies  Allergen Reactions  . Lipitor [Atorvastatin]     Elevated LFT's   Objective:  There were no vitals filed for this visit. There is no height or weight on file to calculate BMI. Constitutional Well developed. Well nourished.  Vascular Dorsalis pedis pulses palpable bilaterally. Posterior tibial pulses palpable bilaterally. Capillary refill normal to all digits.  No cyanosis  or clubbing noted. Pedal hair growth normal.  Neurologic Normal speech. Oriented to person, place, and time. Epicritic sensation to light touch grossly present bilaterally.  Dermatologic R hallux nail thickened dystrophic distally. No open wounds. No skin lesions.  Orthopedic: Normal joint ROM without pain or crepitus bilaterally. No visible deformities. No bony tenderness.   Radiographs: None Assessment:   1. Encounter for long-term (current) use of high-risk medication   2. Onychomycosis    Plan:  Patient was evaluated and treated and all questions answered.  Onychomycosis -Educated on  etiology of nail fungus. -Nails improving per patient. -New LFTs ordered. -eRx refiill for oral terbinafine #30. Educated on risks and benefits of the medication.  Return if symptoms worsen or fail to improve.

## 2018-02-21 ENCOUNTER — Telehealth: Payer: Self-pay | Admitting: Podiatry

## 2018-02-21 MED ORDER — FLUCONAZOLE 150 MG PO TABS: 150.0000 mg | ORAL_TABLET | ORAL | 1 refills | Status: DC

## 2018-02-21 NOTE — Telephone Encounter (Signed)
Slight elevation of LFTs noted on new labs. Discussed with patient stopping Lamisil for right now and the LFTs should normalize. Will instead do Fluconazole weekly. Patient verbalized understanding. New Rx sent to pharmacy

## 2018-03-10 ENCOUNTER — Ambulatory Visit (INDEPENDENT_AMBULATORY_CARE_PROVIDER_SITE_OTHER): Payer: BLUE CROSS/BLUE SHIELD | Admitting: Internal Medicine

## 2018-03-10 ENCOUNTER — Encounter: Payer: Self-pay | Admitting: Internal Medicine

## 2018-03-10 ENCOUNTER — Other Ambulatory Visit (INDEPENDENT_AMBULATORY_CARE_PROVIDER_SITE_OTHER): Payer: BLUE CROSS/BLUE SHIELD

## 2018-03-10 VITALS — BP 146/96 | HR 78 | Temp 98.1°F | Resp 16 | Ht 69.0 in | Wt 200.0 lb

## 2018-03-10 DIAGNOSIS — N400 Enlarged prostate without lower urinary tract symptoms: Secondary | ICD-10-CM | POA: Diagnosis not present

## 2018-03-10 DIAGNOSIS — T502X5A Adverse effect of carbonic-anhydrase inhibitors, benzothiadiazides and other diuretics, initial encounter: Secondary | ICD-10-CM | POA: Diagnosis not present

## 2018-03-10 DIAGNOSIS — E785 Hyperlipidemia, unspecified: Secondary | ICD-10-CM

## 2018-03-10 DIAGNOSIS — Z0000 Encounter for general adult medical examination without abnormal findings: Secondary | ICD-10-CM

## 2018-03-10 DIAGNOSIS — K760 Fatty (change of) liver, not elsewhere classified: Secondary | ICD-10-CM | POA: Diagnosis not present

## 2018-03-10 DIAGNOSIS — E876 Hypokalemia: Secondary | ICD-10-CM

## 2018-03-10 DIAGNOSIS — I10 Essential (primary) hypertension: Secondary | ICD-10-CM

## 2018-03-10 DIAGNOSIS — Z23 Encounter for immunization: Secondary | ICD-10-CM

## 2018-03-10 LAB — LIPID PANEL
CHOLESTEROL: 257 mg/dL — AB (ref 0–200)
HDL: 61.4 mg/dL (ref >39.00)
LDL CALC: 174 mg/dL — AB (ref 0–99)
NonHDL: 195.19
Total CHOL/HDL Ratio: 4
Triglycerides: 107 mg/dL (ref 0.0–149.0)
VLDL: 21.4 mg/dL (ref 0.0–40.0)

## 2018-03-10 LAB — COMPREHENSIVE METABOLIC PANEL
ALT: 40 U/L (ref 0–53)
AST: 32 U/L (ref 0–37)
Albumin: 4.6 g/dL (ref 3.5–5.2)
Alkaline Phosphatase: 62 U/L (ref 39–117)
BUN: 16 mg/dL (ref 6–23)
CALCIUM: 10 mg/dL (ref 8.4–10.5)
CHLORIDE: 102 meq/L (ref 96–112)
CO2: 31 meq/L (ref 19–32)
CREATININE: 1.08 mg/dL (ref 0.40–1.50)
GFR: 75.68 mL/min (ref >60.00)
Glucose, Bld: 90 mg/dL (ref 70–99)
Potassium: 3.8 mEq/L (ref 3.5–5.1)
Sodium: 141 mEq/L (ref 135–145)
Total Bilirubin: 0.4 mg/dL (ref 0.2–1.2)
Total Protein: 7.2 g/dL (ref 6.0–8.3)

## 2018-03-10 LAB — URINALYSIS, ROUTINE W REFLEX MICROSCOPIC
Bilirubin Urine: NEGATIVE
HGB URINE DIPSTICK: NEGATIVE
Ketones, ur: NEGATIVE
Leukocytes, UA: NEGATIVE
NITRITE: NEGATIVE
RBC / HPF: NONE SEEN (ref 0–?)
Specific Gravity, Urine: 1.015 (ref 1.000–1.030)
Total Protein, Urine: 30 — AB
URINE GLUCOSE: NEGATIVE
Urobilinogen, UA: 0.2 (ref 0.0–1.0)
pH: 6.5 (ref 5.0–8.0)

## 2018-03-10 LAB — CBC WITH DIFFERENTIAL/PLATELET
BASOS PCT: 0.3 % (ref 0.0–3.0)
Basophils Absolute: 0 10*3/uL (ref 0.0–0.1)
Eosinophils Absolute: 0 10*3/uL (ref 0.0–0.7)
Eosinophils Relative: 1.2 % (ref 0.0–5.0)
HEMATOCRIT: 42.4 % (ref 39.0–52.0)
Hemoglobin: 14.2 g/dL (ref 13.0–17.0)
LYMPHS PCT: 44.8 % (ref 12.0–46.0)
Lymphs Abs: 1.3 10*3/uL (ref 0.7–4.0)
MCHC: 33.5 g/dL (ref 30.0–36.0)
MCV: 78.3 fl (ref 78.0–100.0)
MONOS PCT: 10.6 % (ref 3.0–12.0)
Monocytes Absolute: 0.3 10*3/uL (ref 0.1–1.0)
Neutro Abs: 1.3 10*3/uL — ABNORMAL LOW (ref 1.4–7.7)
Neutrophils Relative %: 43.1 % (ref 43.0–77.0)
PLATELETS: 174 10*3/uL (ref 150.0–400.0)
RBC: 5.41 Mil/uL (ref 4.22–5.81)
RDW: 14.2 % (ref 11.5–15.5)
WBC: 3 10*3/uL — ABNORMAL LOW (ref 4.0–10.5)

## 2018-03-10 LAB — TSH: TSH: 1.18 u[IU]/mL (ref 0.35–4.50)

## 2018-03-10 LAB — PSA: PSA: 0.79 ng/mL (ref 0.10–4.00)

## 2018-03-10 MED ORDER — PITAVASTATIN CALCIUM 4 MG PO TABS: 1.0000 | ORAL_TABLET | Freq: Every day | ORAL | 1 refills | Status: DC

## 2018-03-10 MED ORDER — NEBIVOLOL HCL 10 MG PO TABS: 10.0000 mg | ORAL_TABLET | Freq: Every day | ORAL | 0 refills | Status: DC

## 2018-03-10 NOTE — Progress Notes (Signed)
Subjective:  Patient ID: Willie Foster, male    DOB: 1966-03-27  Age: 52 y.o. MRN: [id number]  CC: Annual Exam and Hypertension   HPI Eriberto Felch presents for a CPX.  He complains that his blood pressure is not well controlled.  He is not compliant with the second dose of carvedilol each day.  He tells me he is taking the thiazide diuretic.  He is very active and denies any recent episodes of CP, DOE, palpitations, edema, or fatigue.  Outpatient Medications Prior to Visit  Medication Sig Dispense Refill  . acyclovir (ZOVIRAX) 400 MG tablet TAKE ONE TABLET BY MOUTH THREE TIMES DAILY AS NEEDED FOR  FEVER  BLISTERS 30 tablet 3  . Cetirizine HCl (ZYRTEC ALLERGY) 10 MG CAPS Take 10 mg by mouth daily.    . chlorthalidone (HYGROTON) 25 MG tablet Take 1 tablet (25 mg total) by mouth daily. 90 tablet 1  . Cholecalciferol 2000 UNITS CAPS Take 1 capsule (2,000 Units total) by mouth daily. 30 each   . fluconazole (DIFLUCAN) 150 MG tablet Take 1 tablet (150 mg total) by mouth once a week. 6 tablet 1  . fluticasone (FLONASE) 50 MCG/ACT nasal spray Place 2 sprays into both nostrils daily. 16 g 6  . hydrocortisone-pramoxine (PROCTOFOAM HC) rectal foam Place 1 applicator rectally 2 (two) times daily. 10 g 1  . Omega-3 Fatty Acids (FISH OIL PO) Take by mouth.    . potassium chloride SA (KLOR-CON M20) 20 MEQ tablet Take 1 tablet (20 mEq total) by mouth daily. 90 tablet 1  . rivaroxaban (XARELTO) 10 MG TABS tablet Take 1 tablet (10 mg total) by mouth daily. 90 tablet 1  . sildenafil (VIAGRA) 100 MG tablet Take 1 tablet (100 mg total) by mouth daily as needed for erectile dysfunction. 10 tablet 11  . Turmeric 500 MG CAPS Take 1 tablet by mouth 2 (two) times daily.    . valACYclovir (VALTREX) 1000 MG tablet Take 2 tablets (2,000 mg total) by mouth 2 (two) times daily. Use x 1 day as directed for outbreak 30 tablet 0  . vitamin C (ASCORBIC ACID) 500 MG tablet Take 500 mg by mouth daily.    Marland Kitchen  atenolol-chlorthalidone (TENORETIC) 50-25 MG tablet Take by mouth.    . carvedilol (COREG) 3.125 MG tablet TAKE 1 TABLET BY MOUTH TWICE DAILY WITH A MEAL 180 tablet 1  . pravastatin (PRAVACHOL) 40 MG tablet TAKE 1 TABLET BY MOUTH ONCE DAILY 90 tablet 1  . terbinafine (LAMISIL) 250 MG tablet Take 1 tablet (250 mg total) by mouth daily. 30 tablet 0  . amoxicillin-clavulanate (AUGMENTIN) 875-125 MG tablet Take 1 tablet by mouth 2 (two) times daily. 10 tablet 0   No facility-administered medications prior to visit.     ROS Review of Systems  Constitutional: Negative.  Negative for appetite change, diaphoresis, fatigue and unexpected weight change.  HENT: Negative.   Eyes: Negative for visual disturbance.  Respiratory: Negative for cough, chest tightness, shortness of breath and wheezing.   Cardiovascular: Negative for chest pain, palpitations and leg swelling.  Gastrointestinal: Negative for abdominal pain, blood in stool, constipation, diarrhea, nausea and vomiting.  Endocrine: Negative.   Genitourinary: Negative.  Negative for difficulty urinating, discharge, dysuria, hematuria, penile swelling, scrotal swelling, testicular pain and urgency.  Musculoskeletal: Negative.  Negative for arthralgias and myalgias.  Skin: Negative for color change.  Neurological: Negative.  Negative for dizziness, weakness, light-headedness and headaches.  Hematological: Negative for adenopathy. Does not bruise/bleed easily.  Psychiatric/Behavioral:  Negative.     Objective:  BP (!) 146/96 (BP Location: Left Arm, Patient Position: Sitting, Cuff Size: Large)   Pulse 78   Temp 98.1 F (36.7 C) (Oral)   Resp 16   Ht 5' 9"  (1.753 m)   Wt 200 lb (90.7 kg)   SpO2 98%   BMI 29.53 kg/m   BP Readings from Last 3 Encounters:  03/10/18 (!) 146/96  02/07/18 124/80  12/10/17 124/79    Wt Readings from Last 3 Encounters:  03/10/18 200 lb (90.7 kg)  02/07/18 203 lb (92.1 kg)  10/27/17 206 lb (93.4 kg)     Physical Exam  Constitutional: He is oriented to person, place, and time. No distress.  HENT:  Mouth/Throat: Oropharynx is clear and moist. No oropharyngeal exudate.  Eyes: Pupils are equal, round, and reactive to light. EOM are normal. No scleral icterus.  Neck: Normal range of motion. Neck supple. No JVD present. No thyromegaly present.  Cardiovascular: Normal rate, regular rhythm and normal heart sounds. Exam reveals no gallop and no friction rub.  No murmur heard. EKG ---  Sinus  Rhythm  -First degree A-V block  - frequent multiform ectopic ventricular beats  PRi = 222  # VECs = 2, # types 2 BORDERLINE RHYTHM   Pulmonary/Chest: Effort normal and breath sounds normal. No respiratory distress. He has no wheezes. He has no rales.  Abdominal: Soft. Normal appearance and bowel sounds are normal. He exhibits no mass. There is no hepatosplenomegaly. There is no tenderness. Hernia confirmed negative in the right inguinal area and confirmed negative in the left inguinal area.  Genitourinary: Rectum normal, testes normal and penis normal. Rectal exam shows no external hemorrhoid, no internal hemorrhoid, no fissure, no mass, no tenderness, anal tone normal and guaiac negative stool. Prostate is enlarged (1+ smooth symm BPH). Prostate is not tender. Right testis shows no mass, no swelling and no tenderness. Left testis shows no mass, no swelling and no tenderness. No penile erythema or penile tenderness. No discharge found.  Musculoskeletal: Normal range of motion. He exhibits no edema, tenderness or deformity.  Lymphadenopathy:    He has no cervical adenopathy. No inguinal adenopathy noted on the right or left side.  Neurological: He is alert and oriented to person, place, and time.  Skin: Skin is warm and dry. No rash noted. He is not diaphoretic.  Vitals reviewed.   Lab Results  Component Value Date   WBC 3.0 (L) 03/10/2018   HGB 14.2 03/10/2018   HCT 42.4 03/10/2018   PLT 174.0  03/10/2018   GLUCOSE 90 03/10/2018   CHOL 257 (H) 03/10/2018   TRIG 107.0 03/10/2018   HDL 61.40 03/10/2018   LDLCALC 174 (H) 03/10/2018   ALT 40 03/10/2018   AST 32 03/10/2018   NA 141 03/10/2018   K 3.8 03/10/2018   CL 102 03/10/2018   CREATININE 1.08 03/10/2018   BUN 16 03/10/2018   CO2 31 03/10/2018   TSH 1.18 03/10/2018   PSA 0.79 03/10/2018   INR 1.00 11/13/2012   HGBA1C 5.6 12/19/2014    Dg Chest 2 View  Result Date: 01/12/2017 CLINICAL DATA:  Cough EXAM: CHEST  2 VIEW COMPARISON:  June 18, 2014 FINDINGS: Lungs are clear. Heart size and pulmonary vascularity are normal. No adenopathy. No pneumothorax. No bone lesions. IMPRESSION: No edema or consolidation. Electronically Signed   By: Lowella Grip III M.D.   On: 01/12/2017 17:59    Assessment & Plan:   Garet was seen  today for annual exam and hypertension.  Diagnoses and all orders for this visit:  Need for influenza vaccination -     Flu Vaccine QUAD 36+ mos IM  Essential hypertension, benign- His EKG shows a few benign PVCs but he is asymptomatic with this.  There is no LVH or ischemia.  His blood pressure is not adequately well controlled so I will change carvedilol to nebivolol which can be dosed once a day and will improve compliance and hopefully get better blood pressure control.  He will continue chlorthalidone at the current dose. -     CBC with Differential/Platelet; Future -     Comprehensive metabolic panel; Future -     TSH; Future -     nebivolol (BYSTOLIC) 10 MG tablet; Take 1 tablet (10 mg total) by mouth daily. -     Urinalysis, Routine w reflex microscopic; Future -     EKG 12-Lead  Hyperlipidemia with target LDL less than 130- He has an elevated ASCVD risk score and has not achieved his LDL goal.  It looks like either he is not taking the pravastatin or it's not effective.  Nonetheless, I have asked him to upgrade to [place]. -     Pitavastatin Calcium (LIVALO) 4 MG TABS; Take 1 tablet (4  mg total) by mouth daily.  Routine general medical examination at a health care facility- Exam completed, labs reviewed, vaccines reviewed and updated, screening for colon cancer is up-to-date, patient education material was given. -     Lipid panel; Future -     PSA; Future -     HIV Antibody (routine testing w rflx); Future  Diuretic-induced hypokalemia- His potassium level is normal now. -     Comprehensive metabolic panel; Future  Benign prostatic hyperplasia without lower urinary tract symptoms- His PSA is low which is reassuring that he does not have prostate cancer.  He has no symptoms that need to be treated.  Fatty liver- His LFTs are normal.  He was praised for his lifestyle modifications. -     Comprehensive metabolic panel; Future   I have discontinued Dathan Moscoso's pravastatin, carvedilol, amoxicillin-clavulanate, atenolol-chlorthalidone, and terbinafine. I am also having him start on nebivolol and Pitavastatin Calcium. Additionally, I am having him maintain his Cetirizine HCl, Turmeric, Cholecalciferol, sildenafil, acyclovir, vitamin C, Omega-3 Fatty Acids (FISH OIL PO), hydrocortisone-pramoxine, chlorthalidone, rivaroxaban, potassium chloride SA, valACYclovir, fluticasone, and fluconazole.  Meds ordered this encounter  Medications  . nebivolol (BYSTOLIC) 10 MG tablet    Sig: Take 1 tablet (10 mg total) by mouth daily.    Dispense:  84 tablet    Refill:  0  . Pitavastatin Calcium (LIVALO) 4 MG TABS    Sig: Take 1 tablet (4 mg total) by mouth daily.    Dispense:  90 tablet    Refill:  1     Follow-up: Return in about 2 months (around 05/10/2018).  Scarlette Calico, MD

## 2018-03-10 NOTE — Patient Instructions (Signed)

## 2018-03-11 ENCOUNTER — Encounter: Payer: Self-pay | Admitting: Internal Medicine

## 2018-03-11 LAB — HIV ANTIBODY (ROUTINE TESTING W REFLEX): HIV: NONREACTIVE

## 2018-05-12 ENCOUNTER — Other Ambulatory Visit: Payer: Self-pay | Admitting: Internal Medicine

## 2018-05-12 DIAGNOSIS — I10 Essential (primary) hypertension: Secondary | ICD-10-CM

## 2018-05-12 DIAGNOSIS — T502X5A Adverse effect of carbonic-anhydrase inhibitors, benzothiadiazides and other diuretics, initial encounter: Secondary | ICD-10-CM

## 2018-05-12 DIAGNOSIS — E785 Hyperlipidemia, unspecified: Secondary | ICD-10-CM

## 2018-05-12 DIAGNOSIS — E876 Hypokalemia: Secondary | ICD-10-CM

## 2018-05-12 MED ORDER — CHLORTHALIDONE 25 MG PO TABS: 25.0000 mg | ORAL_TABLET | Freq: Every day | ORAL | 0 refills | Status: DC

## 2018-05-12 MED ORDER — POTASSIUM CHLORIDE CRYS ER 20 MEQ PO TBCR: 20.0000 meq | EXTENDED_RELEASE_TABLET | Freq: Every day | ORAL | 1 refills | Status: DC

## 2018-05-13 ENCOUNTER — Other Ambulatory Visit: Payer: Self-pay | Admitting: Oncology

## 2018-05-13 DIAGNOSIS — Z7901 Long term (current) use of anticoagulants: Secondary | ICD-10-CM

## 2018-05-13 DIAGNOSIS — I2782 Chronic pulmonary embolism: Secondary | ICD-10-CM

## 2018-06-16 ENCOUNTER — Ambulatory Visit: Payer: BLUE CROSS/BLUE SHIELD

## 2018-06-20 ENCOUNTER — Other Ambulatory Visit (INDEPENDENT_AMBULATORY_CARE_PROVIDER_SITE_OTHER): Payer: BLUE CROSS/BLUE SHIELD

## 2018-06-20 ENCOUNTER — Ambulatory Visit: Payer: BLUE CROSS/BLUE SHIELD | Admitting: Internal Medicine

## 2018-06-20 ENCOUNTER — Encounter: Payer: Self-pay | Admitting: Internal Medicine

## 2018-06-20 ENCOUNTER — Other Ambulatory Visit: Payer: BLUE CROSS/BLUE SHIELD

## 2018-06-20 VITALS — BP 126/80 | HR 52 | Temp 97.8°F | Resp 16 | Ht 69.0 in | Wt 207.2 lb

## 2018-06-20 DIAGNOSIS — E785 Hyperlipidemia, unspecified: Secondary | ICD-10-CM

## 2018-06-20 DIAGNOSIS — I2782 Chronic pulmonary embolism: Secondary | ICD-10-CM

## 2018-06-20 DIAGNOSIS — T502X5A Adverse effect of carbonic-anhydrase inhibitors, benzothiadiazides and other diuretics, initial encounter: Secondary | ICD-10-CM

## 2018-06-20 DIAGNOSIS — E876 Hypokalemia: Secondary | ICD-10-CM

## 2018-06-20 DIAGNOSIS — I10 Essential (primary) hypertension: Secondary | ICD-10-CM

## 2018-06-20 LAB — COMPREHENSIVE METABOLIC PANEL
ALT: 46 U/L (ref 0–53)
AST: 37 U/L (ref 0–37)
Albumin: 4.8 g/dL (ref 3.5–5.2)
Alkaline Phosphatase: 59 U/L (ref 39–117)
BUN: 15 mg/dL (ref 6–23)
CO2: 30 mEq/L (ref 19–32)
Calcium: 10.3 mg/dL (ref 8.4–10.5)
Chloride: 101 mEq/L (ref 96–112)
Creatinine, Ser: 1.12 mg/dL (ref 0.40–1.50)
GFR: 82.53 mL/min (ref >60.00)
GLUCOSE: 92 mg/dL (ref 70–99)
Potassium: 3.7 mEq/L (ref 3.5–5.1)
Sodium: 140 mEq/L (ref 135–145)
Total Bilirubin: 0.5 mg/dL (ref 0.2–1.2)
Total Protein: 7.1 g/dL (ref 6.0–8.3)

## 2018-06-20 LAB — LIPID PANEL
Cholesterol: 199 mg/dL (ref 0–200)
HDL: 65.1 mg/dL (ref >39.00)
LDL Cholesterol: 113 mg/dL — ABNORMAL HIGH (ref 0–99)
NonHDL: 133.46
Total CHOL/HDL Ratio: 3
Triglycerides: 104 mg/dL (ref 0.0–149.0)
VLDL: 20.8 mg/dL (ref 0.0–40.0)

## 2018-06-20 LAB — MAGNESIUM: Magnesium: 2 mg/dL (ref 1.5–2.5)

## 2018-06-20 NOTE — Patient Instructions (Signed)

## 2018-06-20 NOTE — Progress Notes (Signed)
Subjective:  Patient ID: Willie Foster, male    DOB: 06-Apr-1966  Age: 53 y.o. MRN: [id number]  CC: Hypertension and Hyperlipidemia   HPI Willie Foster presents for f/up - He feels well, offers no complaints. He has very good endurance with his [employer] work outs.  Outpatient Medications Prior to Visit  Medication Sig Dispense Refill  . acyclovir (ZOVIRAX) 400 MG tablet TAKE ONE TABLET BY MOUTH THREE TIMES DAILY AS NEEDED FOR  FEVER  BLISTERS 30 tablet 3  . Cetirizine HCl (ZYRTEC ALLERGY) 10 MG CAPS Take 10 mg by mouth daily.    . chlorthalidone (HYGROTON) 25 MG tablet Take 1 tablet (25 mg total) by mouth daily. 90 tablet 0  . Cholecalciferol 2000 UNITS CAPS Take 1 capsule (2,000 Units total) by mouth daily. 30 each   . nebivolol (BYSTOLIC) 10 MG tablet Take 1 tablet (10 mg total) by mouth daily. 84 tablet 0  . Omega-3 Fatty Acids (FISH OIL PO) Take by mouth.    . Pitavastatin Calcium (LIVALO) 4 MG TABS Take 1 tablet (4 mg total) by mouth daily. 90 tablet 1  . potassium chloride SA (KLOR-CON M20) 20 MEQ tablet Take 1 tablet (20 mEq total) by mouth daily. 90 tablet 1  . rivaroxaban (XARELTO) 10 MG TABS tablet Take 1 tablet (10 mg total) by mouth daily. 90 tablet 1  . sildenafil (VIAGRA) 100 MG tablet Take 1 tablet (100 mg total) by mouth daily as needed for erectile dysfunction. 10 tablet 11  . Turmeric 500 MG CAPS Take 1 tablet by mouth 2 (two) times daily.    . valACYclovir (VALTREX) 1000 MG tablet Take 2 tablets (2,000 mg total) by mouth 2 (two) times daily. Use x 1 day as directed for outbreak 30 tablet 0  . vitamin C (ASCORBIC ACID) 500 MG tablet Take 500 mg by mouth daily.    . fluconazole (DIFLUCAN) 150 MG tablet Take 1 tablet (150 mg total) by mouth once a week. 6 tablet 1  . fluticasone (FLONASE) 50 MCG/ACT nasal spray Place 2 sprays into both nostrils daily. 16 g 6  . hydrocortisone-pramoxine (PROCTOFOAM HC) rectal foam Place 1 applicator rectally 2 (two) times daily. 10 g 1    No facility-administered medications prior to visit.     ROS Review of Systems  Constitutional: Negative for appetite change, diaphoresis, fatigue and unexpected weight change.  HENT: Negative.   Eyes: Negative for visual disturbance.  Respiratory: Negative for chest tightness, shortness of breath and wheezing.   Cardiovascular: Negative for chest pain, palpitations and leg swelling.  Gastrointestinal: Negative for abdominal pain, constipation, diarrhea, nausea and vomiting.  Endocrine: Negative.   Genitourinary: Negative.  Negative for difficulty urinating and dysuria.  Musculoskeletal: Negative for arthralgias and myalgias.  Skin: Negative.  Negative for color change and pallor.  Neurological: Negative.  Negative for weakness, light-headedness and numbness.  Hematological: Negative for adenopathy. Does not bruise/bleed easily.  Psychiatric/Behavioral: Negative.     Objective:  BP 126/80 (BP Location: Left Arm, Patient Position: Sitting, Cuff Size: Large)   Pulse (!) 52   Temp 97.8 F (36.6 C) (Oral)   Resp 16   Ht 5' 9"  (1.753 m)   Wt 207 lb 4 oz (94 kg)   SpO2 98%   BMI 30.61 kg/m   BP Readings from Last 3 Encounters:  06/20/18 126/80  03/10/18 (!) 146/96  02/07/18 124/80    Wt Readings from Last 3 Encounters:  06/20/18 207 lb 4 oz (94 kg)  03/10/18 200 lb (90.7 kg)  02/07/18 203 lb (92.1 kg)    Physical Exam Constitutional:      Appearance: Normal appearance. He is normal weight.  HENT:     Nose: No congestion.     Mouth/Throat:     Mouth: Mucous membranes are moist.     Pharynx: Oropharynx is clear. No oropharyngeal exudate or posterior oropharyngeal erythema.  Eyes:     Conjunctiva/sclera: Conjunctivae normal.  Neck:     Musculoskeletal: Normal range of motion and neck supple. No muscular tenderness.  Cardiovascular:     Rate and Rhythm: Normal rate and regular rhythm.     Heart sounds: No murmur. No friction rub. No gallop.   Pulmonary:      Effort: Pulmonary effort is normal.     Breath sounds: No stridor. No wheezing, rhonchi or rales.  Abdominal:     General: Bowel sounds are normal.     Palpations: There is no hepatomegaly, splenomegaly or mass.     Tenderness: There is no abdominal tenderness.  Musculoskeletal: Normal range of motion.        General: No swelling.     Right lower leg: No edema.     Left lower leg: No edema.  Lymphadenopathy:     Cervical: No cervical adenopathy.  Skin:    General: Skin is warm and dry.     Findings: No erythema or rash.  Neurological:     General: No focal deficit present.     Mental Status: He is oriented to person, place, and time. Mental status is at baseline.     Lab Results  Component Value Date   WBC 3.0 (L) 03/10/2018   HGB 14.2 03/10/2018   HCT 42.4 03/10/2018   PLT 174.0 03/10/2018   GLUCOSE 92 06/20/2018   CHOL 199 06/20/2018   TRIG 104.0 06/20/2018   HDL 65.10 06/20/2018   LDLCALC 113 (H) 06/20/2018   ALT 46 06/20/2018   AST 37 06/20/2018   NA 140 06/20/2018   K 3.7 06/20/2018   CL 101 06/20/2018   CREATININE 1.12 06/20/2018   BUN 15 06/20/2018   CO2 30 06/20/2018   TSH 1.18 03/10/2018   PSA 0.79 03/10/2018   INR 1.00 11/13/2012   HGBA1C 5.6 12/19/2014    Dg Chest 2 View  Result Date: 01/12/2017 CLINICAL DATA:  Cough EXAM: CHEST  2 VIEW COMPARISON:  June 18, 2014 FINDINGS: Lungs are clear. Heart size and pulmonary vascularity are normal. No adenopathy. No pneumothorax. No bone lesions. IMPRESSION: No edema or consolidation. Electronically Signed   By: Lowella Grip III M.D.   On: 01/12/2017 17:59    Assessment & Plan:   Willie Foster was seen today for hypertension and hyperlipidemia.  Diagnoses and all orders for this visit:  Essential hypertension, benign- His BP is well controlled, lytes and renal function are normal. -     Comprehensive metabolic panel; Future -     Magnesium; Future  Diuretic-induced hypokalemia- His K+ level is normal  now, will cont the current meds. -     Comprehensive metabolic panel; Future -     Magnesium; Future  Hyperlipidemia with target LDL less than 130- He has achieved his LDL goal and is doing well on the statin -     Lipid panel; Future   I have discontinued Willie Foster's hydrocortisone-pramoxine, fluticasone, and fluconazole. I am also having him maintain his Cetirizine HCl, Turmeric, Cholecalciferol, sildenafil, acyclovir, vitamin C, Omega-3 Fatty Acids (FISH OIL PO),  rivaroxaban, valACYclovir, nebivolol, Pitavastatin Calcium, potassium chloride SA, and chlorthalidone.  No orders of the defined types were placed in this encounter.    Follow-up: Return in about 6 months (around 12/19/2018).  Scarlette Calico, MD

## 2018-06-22 ENCOUNTER — Other Ambulatory Visit: Payer: Self-pay | Admitting: Internal Medicine

## 2018-06-22 DIAGNOSIS — I10 Essential (primary) hypertension: Secondary | ICD-10-CM

## 2018-06-22 MED ORDER — NEBIVOLOL HCL 10 MG PO TABS: 10.0000 mg | ORAL_TABLET | Freq: Every day | ORAL | 0 refills | Status: DC

## 2018-06-22 NOTE — Telephone Encounter (Signed)
Copied from [place]. Topic: Quick Communication - Rx Refill/Question >> Jun 22, 2018  4:22 PM Willie Foster wrote: Medication: nebivolol (BYSTOLIC) 10 MG tablet  Per patient he is all out of this medication, Rx was to be called in on 06/20/2018   Has the patient contacted their pharmacy? Yes.   (Agent: If no, request that the patient contact the pharmacy for the refill.) (Agent: If yes, when and what did the pharmacy advise?)  Preferred Pharmacy (with phone number or street name): [place], [place]. [phone] (Phone) [phone] (Fax)    Agent: Please be advised that RX refills may take up to 3 business days. We ask that you follow-up with your pharmacy.

## 2018-06-22 NOTE — Telephone Encounter (Signed)
Approved per protocol.  Requested Prescriptions  Pending Prescriptions Disp Refills  . nebivolol (BYSTOLIC) 10 MG tablet 90 tablet 0    Sig: Take 1 tablet (10 mg total) by mouth daily.     Cardiovascular:  Beta Blockers Passed - 06/22/2018  4:34 PM      Passed - Last BP in normal range    BP Readings from Last 1 Encounters:  06/20/18 126/80         Passed - Last Heart Rate in normal range    Pulse Readings from Last 1 Encounters:  06/20/18 (!) 52         Passed - Valid encounter within last 6 months    Recent Outpatient Visits          2 days ago Essential hypertension, benign   [place], Thomas L, MD   3 months ago Need for influenza vaccination   [place], MD   4 months ago [place], Delphia Grates, NP   7 months ago Cough due to bronchospasm   [place], Marvis Repress, FNP   8 months ago Acute URI   [place], Marvis Repress, [place]

## 2018-06-24 ENCOUNTER — Telehealth: Payer: Self-pay

## 2018-06-24 MED ORDER — NEBIVOLOL HCL 10 MG PO TABS: 10.0000 mg | ORAL_TABLET | Freq: Every day | ORAL | 1 refills | Status: DC

## 2018-06-24 MED ORDER — VALACYCLOVIR HCL 1 G PO TABS: 2000.0000 mg | ORAL_TABLET | Freq: Two times a day (BID) | ORAL | 0 refills | Status: DC

## 2018-06-24 MED ORDER — RIVAROXABAN 10 MG PO TABS: 10.0000 mg | ORAL_TABLET | Freq: Every day | ORAL | 1 refills | Status: DC

## 2018-06-24 MED ORDER — CHLORTHALIDONE 25 MG PO TABS: 25.0000 mg | ORAL_TABLET | Freq: Every day | ORAL | 0 refills | Status: DC

## 2018-06-24 MED ORDER — PITAVASTATIN CALCIUM 4 MG PO TABS: 1.0000 | ORAL_TABLET | Freq: Every day | ORAL | 1 refills | Status: DC

## 2018-06-24 MED ORDER — CETIRIZINE HCL 10 MG PO CAPS: 10.0000 mg | ORAL_CAPSULE | Freq: Every day | ORAL | 1 refills | Status: DC

## 2018-06-24 MED ORDER — POTASSIUM CHLORIDE CRYS ER 20 MEQ PO TBCR: 20.0000 meq | EXTENDED_RELEASE_TABLET | Freq: Every day | ORAL | 1 refills | Status: DC

## 2018-06-24 NOTE — Addendum Note (Signed)
Addended by: Aviva Signs M on: 06/24/2018 11:05 AM   Modules accepted: Orders

## 2018-06-24 NOTE — Telephone Encounter (Signed)
Key: [id number]

## 2018-06-24 NOTE — Telephone Encounter (Signed)
Pt informed PA has been approved via detailed message on vm.

## 2018-06-30 ENCOUNTER — Encounter: Payer: Self-pay | Admitting: Internal Medicine

## 2018-06-30 ENCOUNTER — Ambulatory Visit: Payer: BLUE CROSS/BLUE SHIELD | Admitting: Internal Medicine

## 2018-06-30 VITALS — BP 126/70 | HR 60 | Temp 98.7°F | Resp 16 | Ht 69.0 in | Wt 208.0 lb

## 2018-06-30 DIAGNOSIS — K2100 Gastro-esophageal reflux disease with esophagitis, without bleeding: Secondary | ICD-10-CM | POA: Insufficient documentation

## 2018-06-30 MED ORDER — DEXLANSOPRAZOLE 60 MG PO CPDR: 60.0000 mg | DELAYED_RELEASE_CAPSULE | Freq: Every day | ORAL | 1 refills | Status: DC

## 2018-06-30 NOTE — Patient Instructions (Signed)
Gastroesophageal Reflux Disease, Adult Gastroesophageal reflux (GER) happens when acid from the stomach flows up into the tube that connects the mouth and the stomach (esophagus). Normally, food travels down the esophagus and stays in the stomach to be digested. However, when a person has GER, food and stomach acid sometimes move back up into the esophagus. If this becomes a more serious problem, the person may be diagnosed with a disease called gastroesophageal reflux disease (GERD). GERD occurs when the reflux:  Happens often.  Causes frequent or severe symptoms.  Causes problems such as damage to the esophagus. When stomach acid comes in contact with the esophagus, the acid may cause soreness (inflammation) in the esophagus. Over time, GERD may create small holes (ulcers) in the lining of the esophagus. What are the causes? This condition is caused by a problem with the muscle between the esophagus and the stomach (lower esophageal sphincter, or LES). Normally, the LES muscle closes after food passes through the esophagus to the stomach. When the LES is weakened or abnormal, it does not close properly, and that allows food and stomach acid to go back up into the esophagus. The LES can be weakened by certain dietary substances, medicines, and medical conditions, including:  Tobacco use.  Pregnancy.  Having a hiatal hernia.  Alcohol use.  Certain foods and beverages, such as coffee, chocolate, onions, and peppermint. What increases the risk? You are more likely to develop this condition if you:  Have an increased body weight.  Have a connective tissue disorder.  Use NSAID medicines. What are the signs or symptoms? Symptoms of this condition include:  Heartburn.  Difficult or painful swallowing.  The feeling of having a lump in the throat.  Abitter taste in the mouth.  Bad breath.  Having a large amount of saliva.  Having an upset or bloated  stomach.  Belching.  Chest pain. Different conditions can cause chest pain. Make sure you see your health care provider if you experience chest pain.  Shortness of breath or wheezing.  Ongoing (chronic) cough or a night-time cough.  Wearing away of tooth enamel.  Weight loss. How is this diagnosed? Your health care provider will take a medical history and perform a physical exam. To determine if you have mild or severe GERD, your health care provider may also monitor how you respond to treatment. You may also have tests, including:  A test to examine your stomach and esophagus with a small camera (endoscopy).  A test thatmeasures the acidity level in your esophagus.  A test thatmeasures how much pressure is on your esophagus.  A barium swallow or modified barium swallow test to show the shape, size, and functioning of your esophagus. How is this treated? The goal of treatment is to help relieve your symptoms and to prevent complications. Treatment for this condition may vary depending on how severe your symptoms are. Your health care provider may recommend:  Changes to your diet.  Medicine.  Surgery. Follow these instructions at home: Eating and drinking   Follow a diet as recommended by your health care provider. This may involve avoiding foods and drinks such as: ? Coffee and tea (with or without caffeine). ? Drinks that containalcohol. ? Energy drinks and sports drinks. ? Carbonated drinks or sodas. ? Chocolate and cocoa. ? Peppermint and mint flavorings. ? Garlic and onions. ? Horseradish. ? Spicy and acidic foods, including peppers, chili powder, curry powder, vinegar, hot sauces, and barbecue sauce. ? Citrus fruit juices and citrus  fruits, such as oranges, lemons, and limes. ? Tomato-based foods, such as red sauce, chili, salsa, and pizza with red sauce. ? Fried and fatty foods, such as donuts, french fries, potato chips, and high-fat dressings. ? High-fat  meats, such as hot dogs and fatty cuts of red and white meats, such as rib eye steak, sausage, ham, and bacon. ? High-fat dairy items, such as whole milk, butter, and cream cheese.  Eat small, frequent meals instead of large meals.  Avoid drinking large amounts of liquid with your meals.  Avoid eating meals during the 2-3 hours before bedtime.  Avoid lying down right after you eat.  Do not exercise right after you eat. Lifestyle   Do not use any products that contain nicotine or tobacco, such as cigarettes, e-cigarettes, and chewing tobacco. If you need help quitting, ask your health care provider.  Try to reduce your stress by using methods such as yoga or meditation. If you need help reducing stress, ask your health care provider.  If you are overweight, reduce your weight to an amount that is healthy for you. Ask your health care provider for guidance about a safe weight loss goal. General instructions  Pay attention to any changes in your symptoms.  Take over-the-counter and prescription medicines only as told by your health care provider. Do not take aspirin, ibuprofen, or other NSAIDs unless your health care provider told you to do so.  Wear loose-fitting clothing. Do not wear anything tight around your waist that causes pressure on your abdomen.  Raise (elevate) the head of your bed about 6 inches (15 cm).  Avoid bending over if this makes your symptoms worse.  Keep all follow-up visits as told by your health care provider. This is important. Contact a health care provider if:  You have: ? New symptoms. ? Unexplained weight loss. ? Difficulty swallowing or it hurts to swallow. ? Wheezing or a persistent cough. ? A hoarse voice.  Your symptoms do not improve with treatment. Get help right away if you:  Have pain in your arms, neck, jaw, teeth, or back.  Feel sweaty, dizzy, or light-headed.  Have chest pain or shortness of breath.  Vomit and your vomit looks  like blood or coffee grounds.  Faint.  Have stool that is bloody or black.  Cannot swallow, drink, or eat. Summary  Gastroesophageal reflux happens when acid from the stomach flows up into the esophagus. GERD is a disease in which the reflux happens often, causes frequent or severe symptoms, or causes problems such as damage to the esophagus.  Treatment for this condition may vary depending on how severe your symptoms are. Your health care provider may recommend diet and lifestyle changes, medicine, or surgery.  Contact a health care provider if you have new or worsening symptoms.  Take over-the-counter and prescription medicines only as told by your health care provider. Do not take aspirin, ibuprofen, or other NSAIDs unless your health care provider told you to do so.  Keep all follow-up visits as told by your health care provider. This is important. This information is not intended to replace advice given to you by your health care provider. Make sure you discuss any questions you have with your health care provider. Document Released: 02/18/2005 Document Revised: 11/17/2017 Document Reviewed: 11/17/2017 Elsevier Interactive Patient Education  2019 [employer].

## 2018-06-30 NOTE — Progress Notes (Signed)
Subjective:  Patient ID: Willie Foster, male    DOB: 07/29/1965  Age: 53 y.o. MRN: [id number]  CC: Gastroesophageal Reflux   HPI Willie Foster presents for concerns about a one-week history of heartburn and burning under his sternum.  He is also had a few episodes of odynophagia but he denies dysphagia.  The symptoms were exacerbated over the last week after he drank some wine and moonshine and ate barbecue.  He also complains of throat clearing and a water brash in the back of his throat.  He has not taken anything to control his symptoms.  Outpatient Medications Prior to Visit  Medication Sig Dispense Refill  . acyclovir (ZOVIRAX) 400 MG tablet TAKE ONE TABLET BY MOUTH THREE TIMES DAILY AS NEEDED FOR  FEVER  BLISTERS 30 tablet 3  . Cetirizine HCl (ZYRTEC ALLERGY) 10 MG CAPS Take 1 capsule (10 mg total) by mouth daily. 90 capsule 1  . chlorthalidone (HYGROTON) 25 MG tablet Take 1 tablet (25 mg total) by mouth daily. 90 tablet 0  . Cholecalciferol 2000 UNITS CAPS Take 1 capsule (2,000 Units total) by mouth daily. 30 each   . nebivolol (BYSTOLIC) 10 MG tablet Take 1 tablet (10 mg total) by mouth daily. 90 tablet 1  . Omega-3 Fatty Acids (FISH OIL PO) Take by mouth.    . Pitavastatin Calcium (LIVALO) 4 MG TABS Take 1 tablet (4 mg total) by mouth daily. 90 tablet 1  . potassium chloride SA (KLOR-CON M20) 20 MEQ tablet Take 1 tablet (20 mEq total) by mouth daily. 90 tablet 1  . rivaroxaban (XARELTO) 10 MG TABS tablet Take 1 tablet (10 mg total) by mouth daily. 90 tablet 1  . sildenafil (VIAGRA) 100 MG tablet Take 1 tablet (100 mg total) by mouth daily as needed for erectile dysfunction. 10 tablet 11  . Turmeric 500 MG CAPS Take 1 tablet by mouth 2 (two) times daily.    . valACYclovir (VALTREX) 1000 MG tablet Take 2 tablets (2,000 mg total) by mouth 2 (two) times daily. Use x 1 day as directed for outbreak 30 tablet 0  . vitamin C (ASCORBIC ACID) 500 MG tablet Take 500 mg by mouth daily.      No facility-administered medications prior to visit.     ROS Review of Systems  Constitutional: Negative for diaphoresis and fatigue.  HENT: Positive for sore throat, trouble swallowing and voice change.   Respiratory: Negative for cough, chest tightness, shortness of breath and wheezing.   Cardiovascular: Negative for chest pain, palpitations and leg swelling.  Gastrointestinal: Negative for abdominal pain, blood in stool, constipation, diarrhea, nausea and vomiting.  Genitourinary: Negative.  Negative for difficulty urinating and dysuria.  Musculoskeletal: Negative.  Negative for arthralgias, back pain and myalgias.  Skin: Negative.  Negative for color change, pallor and rash.  Neurological: Negative.  Negative for dizziness, weakness, light-headedness and headaches.  Hematological: Negative for adenopathy. Does not bruise/bleed easily.  Psychiatric/Behavioral: Negative.     Objective:  BP 126/70 (BP Location: Left Arm, Patient Position: Sitting, Cuff Size: Large)   Pulse 60   Temp 98.7 F (37.1 C) (Oral)   Resp 16   Ht 5' 9"  (1.753 m)   Wt 208 lb (94.3 kg)   SpO2 97%   BMI 30.72 kg/m   BP Readings from Last 3 Encounters:  06/30/18 126/70  06/20/18 126/80  03/10/18 (!) 146/96    Wt Readings from Last 3 Encounters:  06/30/18 208 lb (94.3 kg)  06/20/18 207 lb  4 oz (94 kg)  03/10/18 200 lb (90.7 kg)    Physical Exam Vitals signs reviewed.  Constitutional:      Appearance: He is not ill-appearing or diaphoretic.  HENT:     Nose: Nose normal. No congestion or rhinorrhea.     Mouth/Throat:     Mouth: Mucous membranes are moist.     Pharynx: Oropharynx is clear. No oropharyngeal exudate or posterior oropharyngeal erythema.  Eyes:     General: No scleral icterus.    Conjunctiva/sclera: Conjunctivae normal.  Neck:     Musculoskeletal: Normal range of motion and neck supple. No muscular tenderness.  Cardiovascular:     Rate and Rhythm: Normal rate and regular  rhythm.     Heart sounds: No murmur. No friction rub. No gallop.   Pulmonary:     Effort: Pulmonary effort is normal.     Breath sounds: No stridor. No wheezing, rhonchi or rales.  Abdominal:     General: Bowel sounds are normal.     Palpations: There is no mass.     Tenderness: There is no abdominal tenderness. There is no guarding.  Musculoskeletal: Normal range of motion.        General: No swelling.     Right lower leg: No edema.     Left lower leg: No edema.  Lymphadenopathy:     Cervical: No cervical adenopathy.  Skin:    General: Skin is warm and dry.     Coloration: Skin is not pale.  Neurological:     General: No focal deficit present.     Mental Status: Mental status is at baseline.  Psychiatric:        Mood and Affect: Mood normal.        Thought Content: Thought content normal.        Judgment: Judgment normal.     Lab Results  Component Value Date   WBC 3.0 (L) 03/10/2018   HGB 14.2 03/10/2018   HCT 42.4 03/10/2018   PLT 174.0 03/10/2018   GLUCOSE 92 06/20/2018   CHOL 199 06/20/2018   TRIG 104.0 06/20/2018   HDL 65.10 06/20/2018   LDLCALC 113 (H) 06/20/2018   ALT 46 06/20/2018   AST 37 06/20/2018   NA 140 06/20/2018   K 3.7 06/20/2018   CL 101 06/20/2018   CREATININE 1.12 06/20/2018   BUN 15 06/20/2018   CO2 30 06/20/2018   TSH 1.18 03/10/2018   PSA 0.79 03/10/2018   INR 1.00 11/13/2012   HGBA1C 5.6 12/19/2014    Dg Chest 2 View  Result Date: 01/12/2017 CLINICAL DATA:  Cough EXAM: CHEST  2 VIEW COMPARISON:  June 18, 2014 FINDINGS: Lungs are clear. Heart size and pulmonary vascularity are normal. No adenopathy. No pneumothorax. No bone lesions. IMPRESSION: No edema or consolidation. Electronically Signed   By: Lowella Grip III M.D.   On: 01/12/2017 17:59    Assessment & Plan:   Willie Foster was seen today for gastroesophageal reflux.  Diagnoses and all orders for this visit:  GERD with esophagitis- He was encouraged to improve his  lifestyle modification and was given patient education material.  I have also asked him to start taking a PPI. -     dexlansoprazole (DEXILANT) 60 MG capsule; Take 1 capsule (60 mg total) by mouth daily.   I am having Willie Foster start on dexlansoprazole. I am also having him maintain his Turmeric, Cholecalciferol, sildenafil, acyclovir, vitamin C, Omega-3 Fatty Acids (FISH OIL PO), chlorthalidone, Cetirizine HCl,  Pitavastatin Calcium, potassium chloride SA, rivaroxaban, valACYclovir, and nebivolol.  Meds ordered this encounter  Medications  . dexlansoprazole (DEXILANT) 60 MG capsule    Sig: Take 1 capsule (60 mg total) by mouth daily.    Dispense:  90 capsule    Refill:  1     Follow-up: Return in about 3 months (around 09/28/2018).  Scarlette Calico, MD

## 2018-07-05 ENCOUNTER — Telehealth: Payer: Self-pay

## 2018-07-05 NOTE — Telephone Encounter (Signed)
PA has been approved.   My chart sent to pt informing of same.

## 2018-07-05 NOTE — Telephone Encounter (Signed)
Key: [id number]

## 2018-07-22 ENCOUNTER — Telehealth: Payer: Self-pay | Admitting: Oncology

## 2018-07-22 ENCOUNTER — Encounter: Payer: Self-pay | Admitting: Oncology

## 2018-07-22 NOTE — Telephone Encounter (Signed)
New hem appt has been scheduled for the pt to see Dr. Alen Blew on 3/25 at 11am. Letter mailed to the pt.

## 2018-07-25 ENCOUNTER — Encounter: Payer: Self-pay | Admitting: *Deleted

## 2018-08-16 ENCOUNTER — Telehealth: Payer: Self-pay | Admitting: Oncology

## 2018-08-16 NOTE — Telephone Encounter (Signed)
Pt cld to reschedule appt with Dr. Alen Blew to 5/5 at [place] to arrive 15 minutes early.

## 2018-08-17 ENCOUNTER — Inpatient Hospital Stay: Payer: BLUE CROSS/BLUE SHIELD | Admitting: Oncology

## 2018-08-23 ENCOUNTER — Other Ambulatory Visit: Payer: Self-pay | Admitting: Internal Medicine

## 2018-08-23 DIAGNOSIS — E785 Hyperlipidemia, unspecified: Secondary | ICD-10-CM

## 2018-09-08 ENCOUNTER — Telehealth: Payer: Self-pay

## 2018-09-08 ENCOUNTER — Other Ambulatory Visit: Payer: Self-pay | Admitting: Internal Medicine

## 2018-09-08 DIAGNOSIS — I2782 Chronic pulmonary embolism: Secondary | ICD-10-CM

## 2018-09-08 DIAGNOSIS — E785 Hyperlipidemia, unspecified: Secondary | ICD-10-CM

## 2018-09-08 DIAGNOSIS — K2100 Gastro-esophageal reflux disease with esophagitis, without bleeding: Secondary | ICD-10-CM

## 2018-09-08 DIAGNOSIS — T502X5A Adverse effect of carbonic-anhydrase inhibitors, benzothiadiazides and other diuretics, initial encounter: Secondary | ICD-10-CM

## 2018-09-08 DIAGNOSIS — E876 Hypokalemia: Secondary | ICD-10-CM

## 2018-09-08 DIAGNOSIS — I10 Essential (primary) hypertension: Secondary | ICD-10-CM

## 2018-09-08 MED ORDER — RIVAROXABAN 10 MG PO TABS: 10.0000 mg | ORAL_TABLET | Freq: Every day | ORAL | 1 refills | Status: DC

## 2018-09-08 MED ORDER — CHLORTHALIDONE 25 MG PO TABS: 25.0000 mg | ORAL_TABLET | Freq: Every day | ORAL | 1 refills | Status: DC

## 2018-09-08 MED ORDER — NEBIVOLOL HCL 10 MG PO TABS: 10.0000 mg | ORAL_TABLET | Freq: Every day | ORAL | 1 refills | Status: DC

## 2018-09-08 MED ORDER — DEXLANSOPRAZOLE 60 MG PO CPDR: 60.0000 mg | DELAYED_RELEASE_CAPSULE | Freq: Every day | ORAL | 1 refills | Status: DC

## 2018-09-08 MED ORDER — POTASSIUM CHLORIDE CRYS ER 20 MEQ PO TBCR: 20.0000 meq | EXTENDED_RELEASE_TABLET | Freq: Every day | ORAL | 1 refills | Status: DC

## 2018-09-08 MED ORDER — PITAVASTATIN CALCIUM 4 MG PO TABS: 1.0000 | ORAL_TABLET | Freq: Every day | ORAL | 1 refills | Status: DC

## 2018-09-08 NOTE — Telephone Encounter (Signed)
PA Key: [facility] PA has been approverd. Pt informed of same.

## 2018-09-08 NOTE — Telephone Encounter (Signed)
Pt is requesting a refill of chlorthalidone, dexilant, nebivolol, pitavastatin, potassium and rivaroxaban to [facility] on [address].

## 2018-09-08 NOTE — Telephone Encounter (Signed)
RXs sent.

## 2018-09-26 ENCOUNTER — Telehealth: Payer: Self-pay | Admitting: Oncology

## 2018-09-26 NOTE — Telephone Encounter (Signed)
Returned patient's phone call regarding rescheduling an appointment, left a voicemail. 

## 2018-09-27 ENCOUNTER — Inpatient Hospital Stay: Payer: BLUE CROSS/BLUE SHIELD | Attending: Oncology | Admitting: Oncology

## 2018-10-03 NOTE — Addendum Note (Signed)
Addended by: Hulan Fray on: 10/03/2018 05:28 PM   Modules accepted: Orders

## 2018-11-29 ENCOUNTER — Ambulatory Visit: Payer: Self-pay

## 2018-11-29 DIAGNOSIS — Z1159 Encounter for screening for other viral diseases: Secondary | ICD-10-CM | POA: Diagnosis not present

## 2018-11-29 NOTE — Telephone Encounter (Signed)
Okay per PCP to have pt tested for COVID.

## 2018-11-29 NOTE — Telephone Encounter (Signed)
Pt c/o loss of taste and smell and would like to have covid testing. Pt denies SOB, fever. Care advice given and pt verbalized understanding. Steva Ready at PCP office and was asked to send note. Pt advised that someone will call him back. Pt verbalized understanding.  Reason for Disposition . [1] COVID-19 infection suspected by caller or triager AND [2] mild symptoms (cough, fever, or others) AND [5] no complications or SOB  Answer Assessment - Initial Assessment Questions 1. COVID-19 DIAGNOSIS: "Who made your Coronavirus (COVID-19) diagnosis?" "Was it confirmed by a positive lab test?" If not diagnosed by a HCP, ask "Are there lots of cases (community spread) where you live?" (See public health department website, if unsure)     n/a 2. ONSET: "When did the COVID-19 symptoms start?"      Loss of taste 3. WORST SYMPTOM: "What is your worst symptom?" (e.g., cough, fever, shortness of breath, muscle aches)     Loss of taste 4. COUGH: "Do you have a cough?" If so, ask: "How bad is the cough?"       Occasional cough 5. FEVER: "Do you have a fever?" If so, ask: "What is your temperature, how was it measured, and when did it start?"     no 6. RESPIRATORY STATUS: "Describe your breathing?" (e.g., shortness of breath, wheezing, unable to speak)      no 7. BETTER-SAME-WORSE: "Are you getting better, staying the same or getting worse compared to yesterday?"  If getting worse, ask, "In what way?"     same 8. HIGH RISK DISEASE: "Do you have any chronic medical problems?" (e.g., asthma, heart or lung disease, weak immune system, etc.)     no 9. PREGNANCY: "Is there any chance you are pregnant?" "When was your last menstrual period?"     n/a 10. OTHER SYMPTOMS: "Do you have any other symptoms?"  (e.g., chills, fatigue, headache, loss of smell or taste, muscle pain, sore throat)       Loss of smell and taste  Protocols used: CORONAVIRUS (COVID-19) DIAGNOSED OR SUSPECTED-A-AH

## 2018-12-01 NOTE — Telephone Encounter (Signed)
Patient states he had COVID testing done on Monday. /

## 2018-12-08 ENCOUNTER — Other Ambulatory Visit: Payer: Self-pay | Admitting: Internal Medicine

## 2019-01-04 ENCOUNTER — Ambulatory Visit: Payer: BLUE CROSS/BLUE SHIELD | Admitting: Family Medicine

## 2019-01-04 ENCOUNTER — Encounter: Payer: Self-pay | Admitting: Family Medicine

## 2019-01-04 ENCOUNTER — Other Ambulatory Visit: Payer: Self-pay

## 2019-01-04 VITALS — BP 136/71 | HR 68 | Temp 98.4°F | Wt 207.0 lb

## 2019-01-04 DIAGNOSIS — S39012A Strain of muscle, fascia and tendon of lower back, initial encounter: Secondary | ICD-10-CM

## 2019-01-04 MED ORDER — PREDNISONE 10 MG PO TABS: 30.0000 mg | ORAL_TABLET | Freq: Every day | ORAL | 0 refills | Status: DC

## 2019-01-04 MED ORDER — DICLOFENAC SODIUM 1 % TD GEL: 4.0000 g | Freq: Four times a day (QID) | TRANSDERMAL | 11 refills | Status: DC

## 2019-01-04 NOTE — Patient Instructions (Signed)
Thank you for coming in today. Attend PT.  Use heating pad and TENS unit.  Generally stay active.  Use medicine for pain as needed.  Ok to take tylenol.  Use prednisone for up to 5 days if needed for pain.  If not improving let me know.  Come back or go to the emergency room if you notice new weakness new numbness problems walking or bowel or bladder problems.  TENS UNIT: This is helpful for muscle pain and spasm.   Search and Purchase a TENS 7000 2nd edition at  www.tenspros.com or www.[place].com It should be less than $30.     TENS unit instructions: Do not shower or bathe with the unit on Turn the unit off before removing electrodes or batteries If the electrodes lose stickiness add a drop of water to the electrodes after they are disconnected from the unit and place on plastic sheet. If you continued to have difficulty, call the TENS unit company to purchase more electrodes. Do not apply lotion on the skin area prior to use. Make sure the skin is clean and dry as this will help prolong the life of the electrodes. After use, always check skin for unusual red areas, rash or other skin difficulties. If there are any skin problems, does not apply electrodes to the same area. Never remove the electrodes from the unit by pulling the wires. Do not use the TENS unit or electrodes other than as directed. Do not change electrode placement without consultating your therapist or physician. Keep 2 fingers with between each electrode. Wear time ratio is 2:1, on to off times.    For example on for 30 minutes off for 15 minutes and then on for 30 minutes off for 15 minutes    Lumbosacral Strain Lumbosacral strain is an injury that causes pain in the lower back (lumbosacral spine). This injury usually occurs from overstretching the muscles or ligaments along your spine. A strain can affect one or more muscles or cord-like tissues that connect bones to other bones (ligaments). What are the  causes? This condition may be caused by:  A hard, direct hit (blow) to the back.  Excessive stretching of the lower back muscles. This may result from: ? A fall. ? Lifting something heavy. ? Repetitive movements such as bending or crouching. What increases the risk? The following factors may increase your risk of getting this condition:  Participating in sports or activities that involve: ? A sudden twist of the back. ? Pushing or pulling motions.  Being overweight or obese.  Having poor strength and flexibility, especially tight hamstrings or weak muscles in the back or abdomen.  Having too much of a curve in the lower back.  Having a pelvis that is tilted forward. What are the signs or symptoms? The main symptom of this condition is pain in the lower back, at the site of the strain. Pain may extend (radiate) down one or both legs. How is this diagnosed? This condition is diagnosed based on:  Your symptoms.  Your medical history.  A physical exam. ? Your health care provider may push on certain areas of your back to determine the source of your pain. ? You may be asked to bend forward, backward, and side to side to assess the severity of your pain and your range of motion.  Imaging tests, such as: ? X-rays. ? MRI.  How is this treated? Treatment for this condition may include:  Putting heat and cold on the affected  area.  Medicines to help relieve pain and relax your muscles (muscle relaxants).  NSAIDs to help reduce swelling and discomfort. When your symptoms improve, it is important to gradually return to your normal routine as soon as possible to reduce pain, avoid stiffness, and avoid loss of muscle strength. Generally, symptoms should improve within 6 weeks of treatment. However, recovery time varies. Follow these instructions at home: Managing pain, stiffness, and swelling   If directed, put ice on the injured area during the first 24 hours after your  strain. ? Put ice in a plastic bag. ? Place a towel between your skin and the bag. ? Leave the ice on for 20 minutes, 2-3 times a day.  If directed, put heat on the affected area as often as told by your health care provider. Use the heat source that your health care provider recommends, such as a moist heat pack or a heating pad. ? Place a towel between your skin and the heat source. ? Leave the heat on for 20-30 minutes. ? Remove the heat if your skin turns bright red. This is especially important if you are unable to feel pain, heat, or cold. You may have a greater risk of getting burned. Activity  Rest and return to your normal activities as told by your health care provider. Ask your health care provider what activities are safe for you.  Avoid activities that take a lot of energy for as long as told by your health care provider. General instructions  Take over-the-counter and prescription medicines only as told by your health care provider.  Donot drive or use heavy machinery while taking prescription pain medicine.  Do not use any products that contain nicotine or tobacco, such as cigarettes and e-cigarettes. If you need help quitting, ask your health care provider.  Keep all follow-up visits as told by your health care provider. This is important. How is this prevented?  Use correct form when playing sports and lifting heavy objects.  Use good posture when sitting and standing.  Maintain a healthy weight.  Sleep on a mattress with medium firmness to support your back.  Be safe and responsible while being active to avoid falls.  Do at least 150 minutes of moderate-intensity exercise each week, such as brisk walking or water aerobics. Try a form of exercise that takes stress off your back, such as swimming or stationary cycling.  Maintain physical fitness, including: ? Strength. ? Flexibility. ? Cardiovascular fitness. ? Endurance. Contact a health care provider if:   Your back pain does not improve after 6 weeks of treatment.  Your symptoms get worse. Get help right away if:  Your back pain is severe.  You cannot stand or walk.  You have difficulty controlling when you urinate or when you have a bowel movement.  You feel nauseous or you vomit.  Your feet get very cold.  You have numbness, tingling, weakness, or problems using your arms or legs.  You develop any of the following: ? Shortness of breath. ? Dizziness. ? Pain in your legs. ? Weakness in your buttocks or legs. ? Discoloration of the skin on your toes or legs. This information is not intended to replace advice given to you by your health care provider. Make sure you discuss any questions you have with your health care provider. Document Released: 02/18/2005 Document Revised: 09/02/2018 Document Reviewed: 10/13/2015 Elsevier Patient Education  2020 [employer].

## 2019-01-04 NOTE — Progress Notes (Signed)
Willie Foster is a 53 y.o. male who presents to [place] today for back pain.  Started about 3 days ago Juanda Crumble developed pain in his right low back.  He cannot recall any injury.  He has tried over-the-counter medicines for pain, Epson salt soaks, and massage which helped a little.  He notes prescription strength NSAIDs over-the-counter are not effective to control his pain.  In the past has had some benefit with steroids.  He denies any pain radiating down his legs.  He denies weakness or numbness distally.  He denies any fevers or chills.  He feels pretty well otherwise.  Pain is worse with activity and better with rest.  Symptoms are moderate.   ROS:  As above  Exam:  BP 136/71   Pulse 68   Temp 98.4 F (36.9 C) (Oral)   Wt 207 lb (93.9 kg)   BMI 30.57 kg/m  Wt Readings from Last 5 Encounters:  01/04/19 207 lb (93.9 kg)  06/30/18 208 lb (94.3 kg)  06/20/18 207 lb 4 oz (94 kg)  03/10/18 200 lb (90.7 kg)  02/07/18 203 lb (92.1 kg)   General: Well Developed, well nourished, and in no acute distress.  Neuro/Psych: Alert and oriented x3, extra-ocular muscles intact, able to move all 4 extremities, sensation grossly intact. Skin: Warm and dry, no rashes noted.  Respiratory: Not using accessory muscles, speaking in full sentences, trachea midline.  Cardiovascular: Pulses palpable, no extremity edema. Abdomen: Does not appear distended. MSK: L-spine: Nontender to spinal midline.  Tender palpation lumbar paraspinal musculature. Decreased lumbar motion. Lower extremity strength reflexes and sensation are equal normal throughout. Negative slump test. Hips bilaterally have normal motion nontender, strength.    Lab and Radiology Results EXAM: LUMBAR SPINE - COMPLETE 4+ VIEW  COMPARISON:  None.  FINDINGS: Gentle leftward curvature of the lumbar spine. No displaced acute fracture or dislocation. Maintained intervertebral disc  spaces. No overt degenerative change. Mild atherosclerotic vascular calcification.  IMPRESSION: No acute or aggressive osseous finding of the lumbar spine.   Electronically Signed   By: Carlos Levering M.D.   On: 12/29/2013 13:13 I personally (independently) visualized and performed the interpretation of the images attached in this note.     Assessment and Plan: 53 y.o. male with low back pain.  Very likely lumbosacral strain.  Plan for physical therapy.  Additionally recommend heating pad and TENS unit.  Since he is exhausting oral NSAIDs it may be reasonable to try diclofenac gel.  He may have some mild to moderate benefit.  Additionally with Eliquis would recommend against high-dose oral NSAIDs. Will use short course of prednisone as well.  He does not have radicular pain but may experience some benefit.  Recheck back if not improving.  Physical therapy should be most effective treatment.   PDMP not reviewed this encounter. Orders Placed This Encounter  Procedures  . Ambulatory referral to Physical Therapy    Referral Priority:   Routine    Referral Type:   Physical Medicine    Referral Reason:   Specialty Services Required    Requested Specialty:   Physical Therapy  . Ambulatory referral to Physical Therapy    Referral Priority:   Routine    Referral Type:   Physical Medicine    Referral Reason:   Specialty Services Required    Requested Specialty:   Physical Therapy   Meds ordered this encounter  Medications  . predniSONE (DELTASONE) 10 MG tablet  Sig: Take 3 tablets (30 mg total) by mouth daily with breakfast.    Dispense:  15 tablet    Refill:  0  . diclofenac sodium (VOLTAREN) 1 % GEL    Sig: Apply 4 g topically 4 (four) times daily. To affected joint.    Dispense:  100 g    Refill:  11    Historical information moved to improve visibility of documentation.  Past Medical History:  Diagnosis Date  . Elevated serum creatinine 06/15/2016   1.66 06/02/16  .  Fatty liver disease, nonalcoholic 0109  . Hyperlipidemia   . Hypertension   . Leukopenia 01/18/2013   WBC 3,400 01/18/13 was normal 8/22 Xarelto?  . Pulmonary embolism ([place]) 10/2012   Right lung   No past surgical history on file. Social History   Tobacco Use  . Smoking status: Never Smoker  . Smokeless tobacco: Never Used  Substance Use Topics  . Alcohol use: Yes    Alcohol/week: 0.0 standard drinks    Comment: occasional-twice a week   family history includes Breast cancer in his mother; Hypertension in his father; Lung cancer in his father.  Medications: Current Outpatient Medications  Medication Sig Dispense Refill  . acyclovir (ZOVIRAX) 400 MG tablet TAKE ONE TABLET BY MOUTH THREE TIMES DAILY AS NEEDED FOR  FEVER  BLISTERS 30 tablet 3  . chlorthalidone (HYGROTON) 25 MG tablet Take 1 tablet (25 mg total) by mouth daily. 90 tablet 1  . Cholecalciferol 2000 UNITS CAPS Take 1 capsule (2,000 Units total) by mouth daily. 30 each   . dexlansoprazole (DEXILANT) 60 MG capsule Take 1 capsule (60 mg total) by mouth daily. 90 capsule 1  . diclofenac sodium (VOLTAREN) 1 % GEL Apply 4 g topically 4 (four) times daily. To affected joint. 100 g 11  . EQ ALLERGY RELIEF, CETIRIZINE, 10 MG tablet Take 1 tablet by mouth once daily 90 tablet 1  . nebivolol (BYSTOLIC) 10 MG tablet Take 1 tablet (10 mg total) by mouth daily. 90 tablet 1  . Omega-3 Fatty Acids (FISH OIL PO) Take by mouth.    . Pitavastatin Calcium (LIVALO) 4 MG TABS Take 1 tablet (4 mg total) by mouth daily. 90 tablet 1  . potassium chloride SA (KLOR-CON M20) 20 MEQ tablet Take 1 tablet (20 mEq total) by mouth daily. 90 tablet 1  . predniSONE (DELTASONE) 10 MG tablet Take 3 tablets (30 mg total) by mouth daily with breakfast. 15 tablet 0  . rivaroxaban (XARELTO) 10 MG TABS tablet Take 1 tablet (10 mg total) by mouth daily. 90 tablet 1  . sildenafil (VIAGRA) 100 MG tablet Take 1 tablet (100 mg total) by mouth daily as needed for  erectile dysfunction. 10 tablet 11  . Turmeric 500 MG CAPS Take 1 tablet by mouth 2 (two) times daily.    . valACYclovir (VALTREX) 1000 MG tablet Take 2 tablets (2,000 mg total) by mouth 2 (two) times daily. Use x 1 day as directed for outbreak 30 tablet 0  . vitamin C (ASCORBIC ACID) 500 MG tablet Take 500 mg by mouth daily.     No current facility-administered medications for this visit.    Allergies  Allergen Reactions  . Lipitor [Atorvastatin]     Elevated LFT's      Discussed warning signs or symptoms. Please see discharge instructions. Patient expresses understanding.

## 2019-01-05 ENCOUNTER — Ambulatory Visit: Payer: BC Managed Care – PPO | Admitting: Physical Therapy

## 2019-01-09 NOTE — Progress Notes (Signed)
[name] [address], [place] [address] Phone: [phone] Subjective:   Willie Foster, am serving as a scribe for Dr. Hulan Saas.   CC: Low back pain  [id number]  Willie Foster is a 53 y.o. male coming in with complaint of lower back pain. Pain started 1 week ago. Pain has improved since last Tuesday. More a tightness in the hamstrings. Finished prednisone dose pack today.  Patient did have some radicular symptoms.  States that whenever he seems to flex over starts having some more discomfort.  Patient states it does seem to radiate down the right leg intermittently.  Denies any weakness.  Was able to workout the other day without any significant difficulty though.      Past Medical History:  Diagnosis Date  . Elevated serum creatinine 06/15/2016   1.66 06/02/16  . Fatty liver disease, nonalcoholic 4801  . Hyperlipidemia   . Hypertension   . Leukopenia 01/18/2013   WBC 3,400 01/18/13 was normal 8/22 Xarelto?  . Pulmonary embolism ([place]) 10/2012   Right lung   Foster past surgical history on file. Social History   Socioeconomic History  . Marital status: Single    Spouse name: Not on file  . Number of children: 0  . Years of education: Not on file  . Highest education level: Not on file  Occupational History  . Occupation: Manufacturing engineer: Engineer, manufacturing  Social Needs  . Financial resource strain: Not on file  . Food insecurity    Worry: Not on file    Inability: Not on file  . Transportation needs    Medical: Not on file    Non-medical: Not on file  Tobacco Use  . Smoking status: Never Smoker  . Smokeless tobacco: Never Used  Substance and Sexual Activity  . Alcohol use: Yes    Alcohol/week: 0.0 standard drinks    Comment: occasional-twice a week  . Drug use: Foster  . Sexual activity: Not on file  Lifestyle  . Physical activity    Days per week: Not on file    Minutes per session: Not on file  .  Stress: Not on file  Relationships  . Social Herbalist on phone: Not on file    Gets together: Not on file    Attends religious service: Not on file    Active member of club or organization: Not on file    Attends meetings of clubs or organizations: Not on file    Relationship status: Not on file  Other Topics Concern  . Not on file  Social History Narrative  . Not on file   Allergies  Allergen Reactions  . Lipitor [Atorvastatin]     Elevated LFT's   Family History  Problem Relation Age of Onset  . Hypertension Father   . Alcohol abuse Neg Hx   . COPD Neg Hx   . Diabetes Neg Hx   . Drug abuse Neg Hx   . Early death Neg Hx   . Heart disease Neg Hx   . Hyperlipidemia Neg Hx   . Kidney disease Neg Hx   . Stroke Neg Hx   . Lung cancer Father   . Breast cancer Mother        mets to brain    Current Outpatient Medications (Endocrine & Metabolic):  .  predniSONE (DELTASONE) 10 MG tablet, Take 3 tablets (30 mg total) by mouth daily with  breakfast.  Current Outpatient Medications (Cardiovascular):  .  chlorthalidone (HYGROTON) 25 MG tablet, Take 1 tablet (25 mg total) by mouth daily. .  nebivolol (BYSTOLIC) 10 MG tablet, Take 1 tablet (10 mg total) by mouth daily. .  Pitavastatin Calcium (LIVALO) 4 MG TABS, Take 1 tablet (4 mg total) by mouth daily. .  sildenafil (VIAGRA) 100 MG tablet, Take 1 tablet (100 mg total) by mouth daily as needed for erectile dysfunction.  Current Outpatient Medications (Respiratory):  [name] ALLERGY RELIEF, CETIRIZINE, 10 MG tablet, Take 1 tablet by mouth once daily   Current Outpatient Medications (Hematological):  .  rivaroxaban (XARELTO) 10 MG TABS tablet, Take 1 tablet (10 mg total) by mouth daily.  Current Outpatient Medications (Other):  .  acyclovir (ZOVIRAX) 400 MG tablet, TAKE ONE TABLET BY MOUTH THREE TIMES DAILY AS NEEDED FOR  FEVER  BLISTERS .  Cholecalciferol 2000 UNITS CAPS, Take 1 capsule (2,000 Units total) by mouth  daily. Marland Kitchen  dexlansoprazole (DEXILANT) 60 MG capsule, Take 1 capsule (60 mg total) by mouth daily. .  diclofenac sodium (VOLTAREN) 1 % GEL, Apply 4 g topically 4 (four) times daily. To affected joint. .  Omega-3 Fatty Acids (FISH OIL PO), Take by mouth. .  potassium chloride SA (KLOR-CON M20) 20 MEQ tablet, Take 1 tablet (20 mEq total) by mouth daily. .  Turmeric 500 MG CAPS, Take 1 tablet by mouth 2 (two) times daily. .  valACYclovir (VALTREX) 1000 MG tablet, Take 2 tablets (2,000 mg total) by mouth 2 (two) times daily. Use x 1 day as directed for outbreak .  vitamin C (ASCORBIC ACID) 500 MG tablet, Take 500 mg by mouth daily. Marland Kitchen  gabapentin (NEURONTIN) 100 MG capsule, Take 2 capsules (200 mg total) by mouth at bedtime. Marland Kitchen  tiZANidine (ZANAFLEX) 4 MG tablet, Take 1 tablet (4 mg total) by mouth every 6 (six) hours as needed for muscle spasms.    Past medical history, social, surgical and family history all reviewed in electronic medical record.  Foster pertanent information unless stated regarding to the chief complaint.   Review of Systems:  Foster headache, visual changes, nausea, vomiting, diarrhea, constipation, dizziness, abdominal pain, skin rash, fevers, chills, night sweats, weight loss, swollen lymph nodes, body aches, joint swelling,  chest pain, shortness of breath, mood changes.  Positive muscle aches  Objective  Blood pressure 118/72, pulse (!) 55, height 5' 9"  (1.753 m), weight 211 lb (95.7 kg), SpO2 98 %.   General: Foster apparent distress alert and oriented x3 mood and affect normal, dressed appropriately.  HEENT: Pupils equal, extraocular movements intact  Respiratory: Patient's speak in full sentences and does not appear short of breath  Cardiovascular: Foster lower extremity edema, non tender, Foster erythema  Skin: Warm dry intact with Foster signs of infection or rash on extremities or on axial skeleton.  Abdomen: Soft nontender  Neuro: Cranial nerves II through XII are intact, neurovascularly  intact in all extremities with 2+ DTRs and 2+ pulses.  Lymph: Foster lymphadenopathy of posterior or anterior cervical chain or axillae bilaterally.  Gait normal with good balance and coordination.  MSK:  Non tender with full range of motion and good stability and symmetric strength and tone of shoulders, elbows, wrist, hip, knee and ankles bilaterally.  Back Exam:  Inspection: Loss of lordosis Motion: Flexion 40 deg, Extension 35 deg, Side Bending to 35 deg bilaterally,  Rotation to 45 deg bilaterally  SLR laying: Tightness noted on the right side XSLR laying:  Negative  Palpable tenderness: Tender to palpation the paraspinal musculature lumbar spine migrated left. FABER: Positive right. Sensory change: Gross sensation intact to all lumbar and sacral dermatomes.  Reflexes: 2+ at both patellar tendons, 2+ at achilles tendons, Babinski's downgoing.  Strength at foot  Plantar-flexion: 5/5 Dorsi-flexion: 5/5 Eversion: 5/5 Inversion: 5/5  Leg strength  Quad: 5/5 Hamstring: 5/5 Hip flexor: 5/5 Hip abductors: 5/5  Gait unremarkable.  Osteopathic findings   T5 extended rotated and side bent left L2 flexed rotated and side bent right Sacrum right on right    Impression and Recommendations:     This case required medical decision making of moderate complexity. The above documentation has been reviewed and is accurate and complete Lyndal Pulley, DO       Note: This dictation was prepared with Dragon dictation along with smaller phrase technology. Any transcriptional errors that result from this process are unintentional.

## 2019-01-10 ENCOUNTER — Ambulatory Visit: Payer: BC Managed Care – PPO | Admitting: Family Medicine

## 2019-01-10 ENCOUNTER — Encounter: Payer: Self-pay | Admitting: Family Medicine

## 2019-01-10 ENCOUNTER — Other Ambulatory Visit: Payer: Self-pay

## 2019-01-10 VITALS — BP 118/72 | HR 55 | Ht 69.0 in | Wt 211.0 lb

## 2019-01-10 DIAGNOSIS — M999 Biomechanical lesion, unspecified: Secondary | ICD-10-CM

## 2019-01-10 DIAGNOSIS — M5416 Radiculopathy, lumbar region: Secondary | ICD-10-CM

## 2019-01-10 MED ORDER — TIZANIDINE HCL 4 MG PO TABS: 4.0000 mg | ORAL_TABLET | Freq: Four times a day (QID) | ORAL | 0 refills | Status: DC | PRN

## 2019-01-10 MED ORDER — GABAPENTIN 100 MG PO CAPS: 200.0000 mg | ORAL_CAPSULE | Freq: Every day | ORAL | 3 refills | Status: DC

## 2019-01-10 MED ORDER — MELOXICAM 15 MG PO TABS: 15.0000 mg | ORAL_TABLET | Freq: Every day | ORAL | 0 refills | Status: DC

## 2019-01-10 NOTE — Assessment & Plan Note (Signed)
Mild lumbar radiculopathy.  Discussed icing regimen and home exercises, discussed which are contributing to his tingling.  Discussed posture and ergonomics, avoid certain activities and I think any contributing.  Patient responded well to osteopathic manipulation.  Gabapentin and Zanaflex given and will avoid anti-inflammatory secondary to the blood thinner.  Follow-up again in 3 weeks

## 2019-01-10 NOTE — Patient Instructions (Signed)
Good to see you.  Ice 20 minutes 2 times daily. Usually after activity and before bed. Exercises 3 times a week.  Gabapentin 274m at night pennsaid pinkie amount topically 2 times daily as needed. This is a sample  Zanaflex if you need something for muscle spasms.  See me again in 3-4 weeks if not perfect

## 2019-01-10 NOTE — Assessment & Plan Note (Signed)
Decision today to treat with OMT was based on Physical Exam  After verbal consent patient was treated with HVLA, ME, FPR techniques in cervical, thoracic, lumbar and sacral areas  Patient tolerated the procedure well with improvement in symptoms  Patient given exercises, stretches and lifestyle modifications  See medications in patient instructions if given  Patient will follow up in 3-4 weeks  

## 2019-01-12 ENCOUNTER — Other Ambulatory Visit: Payer: Self-pay | Admitting: Internal Medicine

## 2019-01-12 DIAGNOSIS — B001 Herpesviral vesicular dermatitis: Secondary | ICD-10-CM

## 2019-02-15 ENCOUNTER — Encounter: Payer: Self-pay | Admitting: Family Medicine

## 2019-02-15 ENCOUNTER — Other Ambulatory Visit: Payer: Self-pay

## 2019-02-15 ENCOUNTER — Ambulatory Visit: Payer: BC Managed Care – PPO | Admitting: Family Medicine

## 2019-02-15 ENCOUNTER — Ambulatory Visit (INDEPENDENT_AMBULATORY_CARE_PROVIDER_SITE_OTHER)
Admission: RE | Admit: 2019-02-15 | Discharge: 2019-02-15 | Disposition: A | Discharge status: Home / Self Care | Payer: BC Managed Care – PPO | Source: Ambulatory Visit | Attending: Family Medicine | Admitting: Family Medicine

## 2019-02-15 VITALS — BP 120/86 | HR 58 | Ht 69.0 in | Wt 213.0 lb

## 2019-02-15 DIAGNOSIS — Z23 Encounter for immunization: Secondary | ICD-10-CM | POA: Diagnosis not present

## 2019-02-15 DIAGNOSIS — G8929 Other chronic pain: Secondary | ICD-10-CM

## 2019-02-15 DIAGNOSIS — M549 Dorsalgia, unspecified: Secondary | ICD-10-CM

## 2019-02-15 DIAGNOSIS — M999 Biomechanical lesion, unspecified: Secondary | ICD-10-CM

## 2019-02-15 DIAGNOSIS — M545 Low back pain: Secondary | ICD-10-CM | POA: Diagnosis not present

## 2019-02-15 DIAGNOSIS — M5416 Radiculopathy, lumbar region: Secondary | ICD-10-CM

## 2019-02-15 MED ORDER — GABAPENTIN 300 MG PO CAPS: 300.0000 mg | ORAL_CAPSULE | Freq: Three times a day (TID) | ORAL | 3 refills | Status: DC

## 2019-02-15 NOTE — Progress Notes (Signed)
[name] [address], [place] [address] Phone: [phone] Subjective:   I Willie Foster am serving as a Education administrator for Dr. Hulan Saas.  I'm seeing this patient by the request  of:    CC: Back pain follow-up  [id number]   01/10/2019 Mild lumbar radiculopathy.  Discussed icing regimen and home exercises, discussed which are contributing to his tingling.  Discussed posture and ergonomics, avoid certain activities and I think any contributing.  Patient responded well to osteopathic manipulation.  Gabapentin and Zanaflex given and will avoid anti-inflammatory secondary to the blood thinner.  Follow-up again in 3 weeks  Update 02/15/2019 Willie Foster is a 53 y.o. male coming in with complaint of back pain. Patient states the pain comes and goes.  Patient states making some progress but still having some discomfort and pain.  Patient describes it as a dull, throbbing aching sensation.  Does not know if the gabapentin is helping at the moment.  Muscle relaxer intermittently.  Patient is still able to workout on a regular basis.     Past Medical History:  Diagnosis Date  . Elevated serum creatinine 06/15/2016   1.66 06/02/16  . Fatty liver disease, nonalcoholic 1694  . Hyperlipidemia   . Hypertension   . Leukopenia 01/18/2013   WBC 3,400 01/18/13 was normal 8/22 Xarelto?  . Pulmonary embolism ([place]) 10/2012   Right lung   No past surgical history on file. Social History   Socioeconomic History  . Marital status: Single    Spouse name: Not on file  . Number of children: 0  . Years of education: Not on file  . Highest education level: Not on file  Occupational History  . Occupation: Manufacturing engineer: Engineer, manufacturing  Social Needs  . Financial resource strain: Not on file  . Food insecurity    Worry: Not on file    Inability: Not on file  . Transportation needs    Medical: Not on file    Non-medical: Not on file  Tobacco  Use  . Smoking status: Never Smoker  . Smokeless tobacco: Never Used  Substance and Sexual Activity  . Alcohol use: Yes    Alcohol/week: 0.0 standard drinks    Comment: occasional-twice a week  . Drug use: No  . Sexual activity: Not on file  Lifestyle  . Physical activity    Days per week: Not on file    Minutes per session: Not on file  . Stress: Not on file  Relationships  . Social Herbalist on phone: Not on file    Gets together: Not on file    Attends religious service: Not on file    Active member of club or organization: Not on file    Attends meetings of clubs or organizations: Not on file    Relationship status: Not on file  Other Topics Concern  . Not on file  Social History Narrative  . Not on file   Allergies  Allergen Reactions  . Lipitor [Atorvastatin]     Elevated LFT's   Family History  Problem Relation Age of Onset  . Hypertension Father   . Alcohol abuse Neg Hx   . COPD Neg Hx   . Diabetes Neg Hx   . Drug abuse Neg Hx   . Early death Neg Hx   . Heart disease Neg Hx   . Hyperlipidemia Neg Hx   . Kidney disease Neg Hx   .  Stroke Neg Hx   . Lung cancer Father   . Breast cancer Mother        mets to brain    Current Outpatient Medications (Endocrine & Metabolic):  .  predniSONE (DELTASONE) 10 MG tablet, Take 3 tablets (30 mg total) by mouth daily with breakfast.  Current Outpatient Medications (Cardiovascular):  .  chlorthalidone (HYGROTON) 25 MG tablet, Take 1 tablet (25 mg total) by mouth daily. .  nebivolol (BYSTOLIC) 10 MG tablet, Take 1 tablet (10 mg total) by mouth daily. .  Pitavastatin Calcium (LIVALO) 4 MG TABS, Take 1 tablet (4 mg total) by mouth daily. .  sildenafil (VIAGRA) 100 MG tablet, Take 1 tablet (100 mg total) by mouth daily as needed for erectile dysfunction.  Current Outpatient Medications (Respiratory):  [name] ALLERGY RELIEF, CETIRIZINE, 10 MG tablet, Take 1 tablet by mouth once daily   Current Outpatient  Medications (Hematological):  .  rivaroxaban (XARELTO) 10 MG TABS tablet, Take 1 tablet (10 mg total) by mouth daily.  Current Outpatient Medications (Other):  .  acyclovir (ZOVIRAX) 400 MG tablet, TAKE 1 TABLET BY MOUTH THREE TIMES DAILY AS NEEDED FOR  FEVER  BLISTERS .  Cholecalciferol 2000 UNITS CAPS, Take 1 capsule (2,000 Units total) by mouth daily. Marland Kitchen  dexlansoprazole (DEXILANT) 60 MG capsule, Take 1 capsule (60 mg total) by mouth daily. .  diclofenac sodium (VOLTAREN) 1 % GEL, Apply 4 g topically 4 (four) times daily. To affected joint. Marland Kitchen  gabapentin (NEURONTIN) 100 MG capsule, Take 2 capsules (200 mg total) by mouth at bedtime. .  Omega-3 Fatty Acids (FISH OIL PO), Take by mouth. .  potassium chloride SA (KLOR-CON M20) 20 MEQ tablet, Take 1 tablet (20 mEq total) by mouth daily. Marland Kitchen  tiZANidine (ZANAFLEX) 4 MG tablet, Take 1 tablet (4 mg total) by mouth every 6 (six) hours as needed for muscle spasms. .  Turmeric 500 MG CAPS, Take 1 tablet by mouth 2 (two) times daily. .  valACYclovir (VALTREX) 1000 MG tablet, Take 2 tablets (2,000 mg total) by mouth 2 (two) times daily. Use x 1 day as directed for outbreak .  vitamin C (ASCORBIC ACID) 500 MG tablet, Take 500 mg by mouth daily. Marland Kitchen  gabapentin (NEURONTIN) 300 MG capsule, Take 1 capsule (300 mg total) by mouth 3 (three) times daily.    Past medical history, social, surgical and family history all reviewed in electronic medical record.  No pertanent information unless stated regarding to the chief complaint.   Review of Systems:  No headache, visual changes, nausea, vomiting, diarrhea, constipation, dizziness, abdominal pain, skin rash, fevers, chills, night sweats, weight loss, swollen lymph nodes, body aches, joint swelling, , chest pain, shortness of breath, mood changes.  Positive muscle aches  Objective  Blood pressure 120/86, pulse (!) 58, height 5' 9"  (1.753 m), weight 213 lb (96.6 kg), SpO2 95 %.    General: No apparent distress  alert and oriented x3 mood and affect normal, dressed appropriately.  HEENT: Pupils equal, extraocular movements intact  Respiratory: Patient's speak in full sentences and does not appear short of breath  Cardiovascular: No lower extremity edema, non tender, no erythema  Skin: Warm dry intact with no signs of infection or rash on extremities or on axial skeleton.  Abdomen: Soft nontender  Neuro: Cranial nerves II through XII are intact, neurovascularly intact in all extremities with 2+ DTRs and 2+ pulses.  Lymph: No lymphadenopathy of posterior or anterior cervical chain or axillae bilaterally.  Gait normal with good balance and coordination.  MSK:  Non tender with full range of motion and good stability and symmetric strength and tone of shoulders, elbows, wrist, hip, knee and ankles bilaterally.  Back Exam:  Inspection: Mild loss of lordosis Motion: Flexion 45 deg, Extension 35 deg, Side Bending to 35 deg bilaterally,  Rotation to 45 deg bilaterally  SLR laying: Negative  XSLR laying: Negative  Palpable tenderness: Tender to palpation more over the right piriformis and the right sacroiliac joint than previously.Marland Kitchen FABER: negative. Sensory change: Gross sensation intact to all lumbar and sacral dermatomes.  Reflexes: 2+ at both patellar tendons, 2+ at achilles tendons, Babinski's downgoing.  Strength at foot  Plantar-flexion: 5/5 Dorsi-flexion: 5/5 Eversion: 5/5 Inversion: 5/5  Leg strength  Quad: 5/5 Hamstring: 5/5 Hip flexor: 5/5 Hip abductors: 5/5  Gait unremarkable.  Osteopathic findings  T7 extended rotated and side bent left L2 flexed rotated and side bent right Sacrum right on right    Impression and Recommendations:     This case required medical decision making of moderate complexity. The above documentation has been reviewed and is accurate and complete Lyndal Pulley, DO       Note: This dictation was prepared with Dragon dictation along with smaller phrase  technology. Any transcriptional errors that result from this process are unintentional.

## 2019-02-15 NOTE — Patient Instructions (Addendum)
Good to see you.  Ice 20 minutes 2 times daily. Usually after activity and before bed. Exercises 3 times a week.  Tennis ball back right pocket Xray downstairs See me again in 4-5 weeks

## 2019-02-15 NOTE — Assessment & Plan Note (Signed)
Patient still has signs and symptoms that are somewhat consistent with a lumbar radiculopathy but the patient is improving slowly.  Will increase gabapentin to 300 mg.  With new exercises for more of the piriformis.  Continue osteopathic manipulation.  In 5 weeks if no significant improvement we will consider a piriformis injection.  X-rays ordered today to evaluate any changes from 5 years ago.

## 2019-02-15 NOTE — Assessment & Plan Note (Signed)
Decision today to treat with OMT was based on Physical Exam  After verbal consent patient was treated with HVLA, ME, FPR techniques in  thoracic, lumbar and sacral areas  Patient tolerated the procedure well with improvement in symptoms  Patient given exercises, stretches and lifestyle modifications  See medications in patient instructions if given  Patient will follow up in 5 weeks

## 2019-03-18 NOTE — Progress Notes (Deleted)
[facility] [place], [place] [address] Phone: [phone] Subjective:    I'm seeing this patient by the request  of:    CC: Low back pain follow-up  DVV:[id number]   02/15/2019 Patient still has signs and symptoms that are somewhat consistent with a lumbar radiculopathy but the patient is improving slowly.  Will increase gabapentin to 300 mg.  With new exercises for more of the piriformis.  Continue osteopathic manipulation.  In 5 weeks if no significant improvement we will consider a piriformis injection.  X-rays ordered today to evaluate any changes from 5 years ago.  Update 03/20/2019 Willie Foster is a 53 y.o. male coming in with complaint of back pain.     Past Medical History:  Diagnosis Date  . Elevated serum creatinine 06/15/2016   1.66 06/02/16  . Fatty liver disease, nonalcoholic 2694  . Hyperlipidemia   . Hypertension   . Leukopenia 01/18/2013   WBC 3,400 01/18/13 was normal 8/22 Xarelto?  . Pulmonary embolism ([place]) 10/2012   Right lung   No past surgical history on file. Social History   Socioeconomic History  . Marital status: Single    Spouse name: Not on file  . Number of children: 0  . Years of education: Not on file  . Highest education level: Not on file  Occupational History  . Occupation: Manufacturing engineer: Engineer, manufacturing  Social Needs  . Financial resource strain: Not on file  . Food insecurity    Worry: Not on file    Inability: Not on file  . Transportation needs    Medical: Not on file    Non-medical: Not on file  Tobacco Use  . Smoking status: Never Smoker  . Smokeless tobacco: Never Used  Substance and Sexual Activity  . Alcohol use: Yes    Alcohol/week: 0.0 standard drinks    Comment: occasional-twice a week  . Drug use: No  . Sexual activity: Not on file  Lifestyle  . Physical activity    Days per week: Not on file    Minutes per session: Not on file  . Stress: Not on file   Relationships  . Social Herbalist on phone: Not on file    Gets together: Not on file    Attends religious service: Not on file    Active member of club or organization: Not on file    Attends meetings of clubs or organizations: Not on file    Relationship status: Not on file  Other Topics Concern  . Not on file  Social History Narrative  . Not on file   Allergies  Allergen Reactions  . Lipitor [Atorvastatin]     Elevated LFT's   Family History  Problem Relation Age of Onset  . Hypertension Father   . Alcohol abuse Neg Hx   . COPD Neg Hx   . Diabetes Neg Hx   . Drug abuse Neg Hx   . Early death Neg Hx   . Heart disease Neg Hx   . Hyperlipidemia Neg Hx   . Kidney disease Neg Hx   . Stroke Neg Hx   . Lung cancer Father   . Breast cancer Mother        mets to brain    Current Outpatient Medications (Endocrine & Metabolic):  .  predniSONE (DELTASONE) 10 MG tablet, Take 3 tablets (30 mg total) by mouth daily with breakfast.  Current Outpatient  Medications (Cardiovascular):  .  chlorthalidone (HYGROTON) 25 MG tablet, Take 1 tablet (25 mg total) by mouth daily. .  nebivolol (BYSTOLIC) 10 MG tablet, Take 1 tablet (10 mg total) by mouth daily. .  Pitavastatin Calcium (LIVALO) 4 MG TABS, Take 1 tablet (4 mg total) by mouth daily. .  sildenafil (VIAGRA) 100 MG tablet, Take 1 tablet (100 mg total) by mouth daily as needed for erectile dysfunction.  Current Outpatient Medications (Respiratory):  [name] ALLERGY RELIEF, CETIRIZINE, 10 MG tablet, Take 1 tablet by mouth once daily   Current Outpatient Medications (Hematological):  .  rivaroxaban (XARELTO) 10 MG TABS tablet, Take 1 tablet (10 mg total) by mouth daily.  Current Outpatient Medications (Other):  .  acyclovir (ZOVIRAX) 400 MG tablet, TAKE 1 TABLET BY MOUTH THREE TIMES DAILY AS NEEDED FOR  FEVER  BLISTERS .  Cholecalciferol 2000 UNITS CAPS, Take 1 capsule (2,000 Units total) by mouth daily. Marland Kitchen   dexlansoprazole (DEXILANT) 60 MG capsule, Take 1 capsule (60 mg total) by mouth daily. .  diclofenac sodium (VOLTAREN) 1 % GEL, Apply 4 g topically 4 (four) times daily. To affected joint. Marland Kitchen  gabapentin (NEURONTIN) 100 MG capsule, Take 2 capsules (200 mg total) by mouth at bedtime. .  gabapentin (NEURONTIN) 300 MG capsule, Take 1 capsule (300 mg total) by mouth 3 (three) times daily. .  Omega-3 Fatty Acids (FISH OIL PO), Take by mouth. .  potassium chloride SA (KLOR-CON M20) 20 MEQ tablet, Take 1 tablet (20 mEq total) by mouth daily. Marland Kitchen  tiZANidine (ZANAFLEX) 4 MG tablet, Take 1 tablet (4 mg total) by mouth every 6 (six) hours as needed for muscle spasms. .  Turmeric 500 MG CAPS, Take 1 tablet by mouth 2 (two) times daily. .  valACYclovir (VALTREX) 1000 MG tablet, Take 2 tablets (2,000 mg total) by mouth 2 (two) times daily. Use x 1 day as directed for outbreak .  vitamin C (ASCORBIC ACID) 500 MG tablet, Take 500 mg by mouth daily.    Past medical history, social, surgical and family history all reviewed in electronic medical record.  No pertanent information unless stated regarding to the chief complaint.   Review of Systems:  No headache, visual changes, nausea, vomiting, diarrhea, constipation, dizziness, abdominal pain, skin rash, fevers, chills, night sweats, weight loss, swollen lymph nodes, body aches, joint swelling, muscle aches, chest pain, shortness of breath, mood changes.   Objective  There were no vitals taken for this visit.    General: No apparent distress alert and oriented x3 mood and affect normal, dressed appropriately.  HEENT: Pupils equal, extraocular movements intact  Respiratory: Patient's speak in full sentences and does not appear short of breath  Cardiovascular: No lower extremity edema, non tender, no erythema  Skin: Warm dry intact with no signs of infection or rash on extremities or on axial skeleton.  Abdomen: Soft nontender  Neuro: Cranial nerves II through  XII are intact, neurovascularly intact in all extremities with 2+ DTRs and 2+ pulses.  Lymph: No lymphadenopathy of posterior or anterior cervical chain or axillae bilaterally.  Gait normal with good balance and coordination.  MSK:  Non tender with full range of motion and good stability and symmetric strength and tone of shoulders, elbows, wrist, hip, knee and ankles bilaterally.  Back Exam:  Inspection: Unremarkable  Motion: Flexion 45 deg, Extension 45 deg, Side Bending to 45 deg bilaterally,  Rotation to 45 deg bilaterally  SLR laying: Negative  XSLR laying: Negative  Palpable tenderness: None. FABER: negative. Sensory change: Gross sensation intact to all lumbar and sacral dermatomes.  Reflexes: 2+ at both patellar tendons, 2+ at achilles tendons, Babinski's downgoing.  Strength at foot  Plantar-flexion: 5/5 Dorsi-flexion: 5/5 Eversion: 5/5 Inversion: 5/5  Leg strength  Quad: 5/5 Hamstring: 5/5 Hip flexor: 5/5 Hip abductors: 5/5  Gait unremarkable.  Osteopathic findings  T9 extended rotated and side bent left L2 flexed rotated and side bent right Sacrum right on right    Impression and Recommendations:     This case required medical decision making of moderate complexity. The above documentation has been reviewed and is accurate and complete Lyndal Pulley, DO       Note: This dictation was prepared with Dragon dictation along with smaller phrase technology. Any transcriptional errors that result from this process are unintentional.

## 2019-03-18 NOTE — Assessment & Plan Note (Deleted)
Decision today to treat with OMT was based on Physical Exam  After verbal consent patient was treated with HVLA, ME, FPR techniques in  thoracic, lumbar and sacral areas  Patient tolerated the procedure well with improvement in symptoms  Patient given exercises, stretches and lifestyle modifications  See medications in patient instructions if given  Patient will follow up in 4-8 weeks

## 2019-03-18 NOTE — Assessment & Plan Note (Deleted)
Responding well to conservative therapy.  Doing home exercises on a regular basis.  Discussed icing regimen.  Discussed which activities to do which wants to avoid.  Patient will increase activity as tolerated.  Follow-up again in 4 to 8 weeks

## 2019-03-20 ENCOUNTER — Ambulatory Visit: Payer: BC Managed Care – PPO | Admitting: Family Medicine

## 2019-03-21 ENCOUNTER — Encounter: Payer: Self-pay | Admitting: Internal Medicine

## 2019-03-21 ENCOUNTER — Other Ambulatory Visit (INDEPENDENT_AMBULATORY_CARE_PROVIDER_SITE_OTHER): Payer: BC Managed Care – PPO

## 2019-03-21 ENCOUNTER — Ambulatory Visit (INDEPENDENT_AMBULATORY_CARE_PROVIDER_SITE_OTHER): Payer: BC Managed Care – PPO | Admitting: Internal Medicine

## 2019-03-21 ENCOUNTER — Other Ambulatory Visit: Payer: Self-pay

## 2019-03-21 VITALS — BP 138/82 | HR 64 | Temp 98.3°F | Resp 16 | Ht 69.0 in | Wt 215.0 lb

## 2019-03-21 DIAGNOSIS — T502X5A Adverse effect of carbonic-anhydrase inhibitors, benzothiadiazides and other diuretics, initial encounter: Secondary | ICD-10-CM | POA: Diagnosis not present

## 2019-03-21 DIAGNOSIS — M533 Sacrococcygeal disorders, not elsewhere classified: Secondary | ICD-10-CM

## 2019-03-21 DIAGNOSIS — I10 Essential (primary) hypertension: Secondary | ICD-10-CM | POA: Diagnosis not present

## 2019-03-21 DIAGNOSIS — Z0000 Encounter for general adult medical examination without abnormal findings: Secondary | ICD-10-CM | POA: Diagnosis not present

## 2019-03-21 DIAGNOSIS — K7581 Nonalcoholic steatohepatitis (NASH): Secondary | ICD-10-CM

## 2019-03-21 DIAGNOSIS — E785 Hyperlipidemia, unspecified: Secondary | ICD-10-CM

## 2019-03-21 DIAGNOSIS — E876 Hypokalemia: Secondary | ICD-10-CM

## 2019-03-21 DIAGNOSIS — K2100 Gastro-esophageal reflux disease with esophagitis, without bleeding: Secondary | ICD-10-CM

## 2019-03-21 DIAGNOSIS — Z125 Encounter for screening for malignant neoplasm of prostate: Secondary | ICD-10-CM

## 2019-03-21 DIAGNOSIS — I2782 Chronic pulmonary embolism: Secondary | ICD-10-CM

## 2019-03-21 LAB — CBC WITH DIFFERENTIAL/PLATELET
Basophils Absolute: 0 10*3/uL (ref 0.0–0.1)
Basophils Relative: 0.4 % (ref 0.0–3.0)
Eosinophils Absolute: 0.1 10*3/uL (ref 0.0–0.7)
Eosinophils Relative: 2.3 % (ref 0.0–5.0)
HCT: 42.2 % (ref 39.0–52.0)
Hemoglobin: 14 g/dL (ref 13.0–17.0)
Lymphocytes Relative: 42 % (ref 12.0–46.0)
Lymphs Abs: 1.4 10*3/uL (ref 0.7–4.0)
MCHC: 33.1 g/dL (ref 30.0–36.0)
MCV: 79.5 fl (ref 78.0–100.0)
Monocytes Absolute: 0.3 10*3/uL (ref 0.1–1.0)
Monocytes Relative: 9.7 % (ref 3.0–12.0)
Neutro Abs: 1.5 10*3/uL (ref 1.4–7.7)
Neutrophils Relative %: 45.6 % (ref 43.0–77.0)
Platelets: 167 10*3/uL (ref 150.0–400.0)
RBC: 5.31 Mil/uL (ref 4.22–5.81)
RDW: 14.2 % (ref 11.5–15.5)
WBC: 3.3 10*3/uL — ABNORMAL LOW (ref 4.0–10.5)

## 2019-03-21 LAB — HEPATIC FUNCTION PANEL
ALT: 46 U/L (ref 0–53)
AST: 45 U/L — ABNORMAL HIGH (ref 0–37)
Albumin: 4.5 g/dL (ref 3.5–5.2)
Alkaline Phosphatase: 60 U/L (ref 39–117)
Bilirubin, Direct: 0.1 mg/dL (ref 0.0–0.3)
Total Bilirubin: 0.4 mg/dL (ref 0.2–1.2)
Total Protein: 6.9 g/dL (ref 6.0–8.3)

## 2019-03-21 LAB — BASIC METABOLIC PANEL
BUN: 13 mg/dL (ref 6–23)
CO2: 34 mEq/L — ABNORMAL HIGH (ref 19–32)
Calcium: 9.8 mg/dL (ref 8.4–10.5)
Chloride: 101 mEq/L (ref 96–112)
Creatinine, Ser: 1.13 mg/dL (ref 0.40–1.50)
GFR: 81.46 mL/min (ref >60.00)
Glucose, Bld: 98 mg/dL (ref 70–99)
Potassium: 3.7 mEq/L (ref 3.5–5.1)
Sodium: 141 mEq/L (ref 135–145)

## 2019-03-21 LAB — LIPID PANEL
Cholesterol: 200 mg/dL (ref 0–200)
HDL: 62.4 mg/dL (ref >39.00)
LDL Cholesterol: 109 mg/dL — ABNORMAL HIGH (ref 0–99)
NonHDL: 137.8
Total CHOL/HDL Ratio: 3
Triglycerides: 142 mg/dL (ref 0.0–149.0)
VLDL: 28.4 mg/dL (ref 0.0–40.0)

## 2019-03-21 NOTE — Patient Instructions (Signed)

## 2019-03-21 NOTE — Progress Notes (Signed)
Subjective:  Patient ID: Willie Foster, male    DOB: [birth year]  Age: 53 y.o. MRN: [id number]  CC: Annual Exam and Hypertension   HPI Willie Foster presents for a CPX.  He tells me that his blood pressure has been well controlled.  He has been working out recently and denies any episodes of chest pain, shortness of breath, palpitations, edema, or fatigue.  Outpatient Medications Prior to Visit  Medication Sig Dispense Refill  . acyclovir (ZOVIRAX) 400 MG tablet TAKE 1 TABLET BY MOUTH THREE TIMES DAILY AS NEEDED FOR  FEVER  BLISTERS 30 tablet 2  . EQ ALLERGY RELIEF, CETIRIZINE, 10 MG tablet Take 1 tablet by mouth once daily 90 tablet 1  . gabapentin (NEURONTIN) 300 MG capsule Take 1 capsule (300 mg total) by mouth 3 (three) times daily. 90 capsule 3  . Omega-3 Fatty Acids (FISH OIL PO) Take by mouth.    . potassium chloride SA (KLOR-CON M20) 20 MEQ tablet Take 1 tablet (20 mEq total) by mouth daily. 90 tablet 1  . sildenafil (VIAGRA) 100 MG tablet Take 1 tablet (100 mg total) by mouth daily as needed for erectile dysfunction. 10 tablet 11  . Turmeric 500 MG CAPS Take 1 tablet by mouth 2 (two) times daily.    . vitamin C (ASCORBIC ACID) 500 MG tablet Take 500 mg by mouth daily.    . chlorthalidone (HYGROTON) 25 MG tablet Take 1 tablet (25 mg total) by mouth daily. 90 tablet 1  . Cholecalciferol 2000 UNITS CAPS Take 1 capsule (2,000 Units total) by mouth daily. 30 each   . dexlansoprazole (DEXILANT) 60 MG capsule Take 1 capsule (60 mg total) by mouth daily. 90 capsule 1  . nebivolol (BYSTOLIC) 10 MG tablet Take 1 tablet (10 mg total) by mouth daily. 90 tablet 1  . Pitavastatin Calcium (LIVALO) 4 MG TABS Take 1 tablet (4 mg total) by mouth daily. 90 tablet 1  . rivaroxaban (XARELTO) 10 MG TABS tablet Take 1 tablet (10 mg total) by mouth daily. 90 tablet 1  . valACYclovir (VALTREX) 1000 MG tablet Take 2 tablets (2,000 mg total) by mouth 2 (two) times daily. Use x 1 day as directed for  outbreak 30 tablet 0  . diclofenac sodium (VOLTAREN) 1 % GEL Apply 4 g topically 4 (four) times daily. To affected joint. 100 g 11  . gabapentin (NEURONTIN) 100 MG capsule Take 2 capsules (200 mg total) by mouth at bedtime. 60 capsule 3  . predniSONE (DELTASONE) 10 MG tablet Take 3 tablets (30 mg total) by mouth daily with breakfast. 15 tablet 0  . tiZANidine (ZANAFLEX) 4 MG tablet Take 1 tablet (4 mg total) by mouth every 6 (six) hours as needed for muscle spasms. 30 tablet 0   No facility-administered medications prior to visit.     ROS Review of Systems  Constitutional: Negative for diaphoresis, fatigue and unexpected weight change.  HENT: Negative.   Eyes: Negative for visual disturbance.  Respiratory: Negative for cough, chest tightness, shortness of breath and wheezing.   Cardiovascular: Negative for chest pain, palpitations and leg swelling.  Gastrointestinal: Negative for abdominal pain, constipation, diarrhea, nausea and vomiting.  Endocrine: Negative.   Genitourinary: Negative.  Negative for difficulty urinating.  Musculoskeletal: Positive for arthralgias. Negative for myalgias.       He complains of a several month history of right buttock and hip pain that is slowly getting better.  He has been followed by sports medicine and thinks he may need  to pursue some other modalities to relieve the pain.  Skin: Negative.  Negative for color change, pallor and rash.  Neurological: Negative.  Negative for dizziness, weakness, light-headedness and headaches.  Hematological: Negative for adenopathy. Does not bruise/bleed easily.  Psychiatric/Behavioral: Negative.     Objective:  BP 138/82 (BP Location: Left Arm, Patient Position: Sitting, Cuff Size: Large)   Pulse 64   Temp 98.3 F (36.8 C) (Oral)   Resp 16   Ht 5' 9"  (1.753 m)   Wt 215 lb (97.5 kg)   SpO2 97%   BMI 31.75 kg/m   BP Readings from Last 3 Encounters:  03/21/19 138/82  02/15/19 120/86  01/10/19 118/72    Wt  Readings from Last 3 Encounters:  03/21/19 215 lb (97.5 kg)  02/15/19 213 lb (96.6 kg)  01/10/19 211 lb (95.7 kg)    Physical Exam Vitals signs reviewed.  Constitutional:      Appearance: Normal appearance.  HENT:     Nose: Nose normal.     Mouth/Throat:     Mouth: Mucous membranes are moist.  Eyes:     General: No scleral icterus.    Conjunctiva/sclera: Conjunctivae normal.  Neck:     Musculoskeletal: Neck supple.  Cardiovascular:     Rate and Rhythm: Normal rate and regular rhythm.     Heart sounds: No murmur.  Pulmonary:     Effort: Pulmonary effort is normal.     Breath sounds: No stridor. No wheezing, rhonchi or rales.  Abdominal:     General: Abdomen is flat. Bowel sounds are normal. There is no distension.     Palpations: Abdomen is soft. There is no hepatomegaly or splenomegaly.     Tenderness: There is no abdominal tenderness.     Hernia: There is no hernia in the left inguinal area or right inguinal area.  Genitourinary:    Pubic Area: No rash.      Penis: Normal.      Scrotum/Testes: Normal.        Right: Mass or tenderness not present.        Left: Mass or tenderness not present.     Epididymis:     Right: Normal.     Left: Normal.     Prostate: Enlarged (1+ smooth symm BPH). Not tender and no nodules present.     Rectum: Normal. Guaiac result negative. No mass, tenderness, anal fissure, external hemorrhoid or internal hemorrhoid. Normal anal tone.  Musculoskeletal: Normal range of motion.     Right lower leg: No edema.     Left lower leg: No edema.  Lymphadenopathy:     Cervical: No cervical adenopathy.     Lower Body: No right inguinal adenopathy. No left inguinal adenopathy.  Skin:    General: Skin is warm and dry.     Coloration: Skin is not pale.  Neurological:     General: No focal deficit present.     Mental Status: He is alert.  Psychiatric:        Mood and Affect: Mood normal.        Behavior: Behavior normal.     Lab Results  Component  Value Date   WBC 3.3 (L) 03/21/2019   HGB 14.0 03/21/2019   HCT 42.2 03/21/2019   PLT 167.0 03/21/2019   GLUCOSE 98 03/21/2019   CHOL 200 03/21/2019   TRIG 142.0 03/21/2019   HDL 62.40 03/21/2019   LDLCALC 109 (H) 03/21/2019   ALT 46 03/21/2019   AST 45 (  H) 03/21/2019   NA 141 03/21/2019   K 3.7 03/21/2019   CL 101 03/21/2019   CREATININE 1.13 03/21/2019   BUN 13 03/21/2019   CO2 34 (H) 03/21/2019   TSH 0.72 03/21/2019   PSA 0.48 03/21/2019   INR 1.00 11/13/2012   HGBA1C 5.6 12/19/2014    Dg Lumbar Spine Complete  Result Date: 02/15/2019 CLINICAL DATA:  Lower back pain AND rt side leg pain x 4 wks, NKI. EXAM: LUMBAR SPINE - COMPLETE 4+ VIEW COMPARISON:  Lumbar spine radiographs 12/29/2013 FINDINGS: Five non-rib-bearing lumbar-type vertebral bodies. Normal spinal alignment. No evidence of new listhesis. Vertebral body heights are maintained without evidence of fracture. There is moderate disc space loss at L4-5 and L5-S1 with associated endplate sclerosis and minimal anterior spurring. Lower lumbar facet hypertrophy. Nonobstructive bowel gas pattern. The lung bases are clear. IMPRESSION: 1. No acute osseous abnormality. 2. Moderate degenerative disc disease at L4-5 and L5-S1. Electronically Signed   By: Audie Pinto M.D.   On: 02/15/2019 13:36    Assessment & Plan:   Willie Foster was seen today for annual exam and hypertension.  Diagnoses and all orders for this visit:  Essential hypertension, benign- His blood pressure is well controlled.  Will continue the combination of nebivolol and chlorthalidone. -     CBC with Differential/Platelet; Future -     Basic metabolic panel; Future -     TSH; Future -     nebivolol (BYSTOLIC) 10 MG tablet; Take 1 tablet (10 mg total) by mouth daily. -     chlorthalidone (HYGROTON) 25 MG tablet; Take 1 tablet (25 mg total) by mouth daily.  Routine general medical examination at a health care facility- Exam completed, labs reviewed, vaccines  reviewed, colon cancer screening is up-to-date, patient education was given. -     Lipid panel; Future -     PSA; Future  SI (sacroiliac) joint dysfunction -     Ambulatory referral to Sports Medicine  Diuretic-induced hypokalemia -     Basic metabolic panel; Future  Hyperlipidemia with target LDL less than 130- He has achieved his LDL goal and is doing well on the statin. -     TSH; Future -     Hepatic function panel; Future -     Pitavastatin Calcium (LIVALO) 4 MG TABS; Take 1 tablet (4 mg total) by mouth daily.  Other chronic pulmonary embolism without acute cor pulmonale (HCC) -     rivaroxaban (XARELTO) 10 MG TABS tablet; Take 1 tablet (10 mg total) by mouth daily.  GERD with esophagitis -     dexlansoprazole (DEXILANT) 60 MG capsule; Take 1 capsule (60 mg total) by mouth daily.  NASH (nonalcoholic steatohepatitis)- His LFTs remain mildly elevated.  I recommended that he start taking pioglitazone and to try to normalize his liver enzymes and prevent long-term sequelae. -     pioglitazone (ACTOS) 15 MG tablet; Take 1 tablet (15 mg total) by mouth daily.   I have discontinued Willie Foster's Cholecalciferol, valACYclovir, predniSONE, diclofenac sodium, and tiZANidine. I am also having him start on pioglitazone. Additionally, I am having him maintain his Turmeric, sildenafil, vitamin C, Omega-3 Fatty Acids (FISH OIL PO), potassium chloride SA, EQ Allergy Relief (Cetirizine), acyclovir, gabapentin, Livalo, rivaroxaban, nebivolol, Dexilant, and chlorthalidone.  Meds ordered this encounter  Medications  . Pitavastatin Calcium (LIVALO) 4 MG TABS    Sig: Take 1 tablet (4 mg total) by mouth daily.    Dispense:  90 tablet  Refill:  1  . rivaroxaban (XARELTO) 10 MG TABS tablet    Sig: Take 1 tablet (10 mg total) by mouth daily.    Dispense:  90 tablet    Refill:  1  . nebivolol (BYSTOLIC) 10 MG tablet    Sig: Take 1 tablet (10 mg total) by mouth daily.    Dispense:  90 tablet     Refill:  1  . dexlansoprazole (DEXILANT) 60 MG capsule    Sig: Take 1 capsule (60 mg total) by mouth daily.    Dispense:  90 capsule    Refill:  1  . chlorthalidone (HYGROTON) 25 MG tablet    Sig: Take 1 tablet (25 mg total) by mouth daily.    Dispense:  90 tablet    Refill:  1  . pioglitazone (ACTOS) 15 MG tablet    Sig: Take 1 tablet (15 mg total) by mouth daily.    Dispense:  90 tablet    Refill:  1     Follow-up: Return in about 6 months (around 09/19/2019).  Scarlette Calico, MD

## 2019-03-23 ENCOUNTER — Telehealth: Payer: Self-pay

## 2019-03-23 ENCOUNTER — Telehealth: Payer: Self-pay | Admitting: *Deleted

## 2019-03-23 DIAGNOSIS — M5416 Radiculopathy, lumbar region: Secondary | ICD-10-CM

## 2019-03-23 LAB — TSH: TSH: 0.72 u[IU]/mL (ref 0.35–4.50)

## 2019-03-23 LAB — PSA: PSA: 0.48 ng/mL (ref 0.10–4.00)

## 2019-03-23 MED ORDER — RIVAROXABAN 10 MG PO TABS: 10.0000 mg | ORAL_TABLET | Freq: Every day | ORAL | 1 refills | Status: DC

## 2019-03-23 MED ORDER — DEXILANT 60 MG PO CPDR: 60.0000 mg | DELAYED_RELEASE_CAPSULE | Freq: Every day | ORAL | 1 refills | Status: DC

## 2019-03-23 MED ORDER — NEBIVOLOL HCL 10 MG PO TABS: 10.0000 mg | ORAL_TABLET | Freq: Every day | ORAL | 1 refills | Status: DC

## 2019-03-23 MED ORDER — CHLORTHALIDONE 25 MG PO TABS: 25.0000 mg | ORAL_TABLET | Freq: Every day | ORAL | 1 refills | Status: DC

## 2019-03-23 MED ORDER — LIVALO 4 MG PO TABS: 1.0000 | ORAL_TABLET | Freq: Every day | ORAL | 1 refills | Status: DC

## 2019-03-23 NOTE — Telephone Encounter (Signed)
Pt is requesting refills for: chlorthalidone, Dexilant, Bystolic, Livalo and Xarelto,

## 2019-03-23 NOTE — Telephone Encounter (Signed)
Pt called stating he's continuing to have a lot of sciatic nerve pain & would like to have an MRI.

## 2019-03-23 NOTE — Telephone Encounter (Signed)
Wish he would have not no showed and we could have discussed.   I am fine with it, lumbar please

## 2019-03-24 ENCOUNTER — Encounter: Payer: Self-pay | Admitting: Internal Medicine

## 2019-03-24 MED ORDER — PIOGLITAZONE HCL 15 MG PO TABS: 15.0000 mg | ORAL_TABLET | Freq: Every day | ORAL | 1 refills | Status: DC

## 2019-03-24 NOTE — Telephone Encounter (Signed)
Order entered & sent to [facility].

## 2019-03-27 ENCOUNTER — Other Ambulatory Visit: Payer: Self-pay

## 2019-03-27 NOTE — Telephone Encounter (Signed)
Follow up on lab results. Noticed that pt had not seen the my chart message. Pt contacted and informed of results.

## 2019-04-18 ENCOUNTER — Other Ambulatory Visit: Payer: BC Managed Care – PPO

## 2019-04-18 NOTE — Telephone Encounter (Signed)
Rx's were sent.

## 2019-05-05 ENCOUNTER — Other Ambulatory Visit: Payer: Self-pay | Admitting: Internal Medicine

## 2019-05-15 ENCOUNTER — Other Ambulatory Visit: Payer: Self-pay | Admitting: Family Medicine

## 2019-05-23 ENCOUNTER — Other Ambulatory Visit: Payer: Self-pay | Admitting: Internal Medicine

## 2019-05-23 DIAGNOSIS — I2782 Chronic pulmonary embolism: Secondary | ICD-10-CM

## 2019-06-01 ENCOUNTER — Telehealth: Payer: Self-pay | Admitting: Internal Medicine

## 2019-06-01 NOTE — Telephone Encounter (Signed)
Left detailed message for patient requesting a little more information.   RE: does patient want [place] to change the medication or does he want [place] to do a prior authorization.

## 2019-06-01 NOTE — Telephone Encounter (Signed)
Pt is requesting a callback from [facility] regarding his Pitavastatin Calcium (LIVALO) 4 MG TABS He stated there is an issue with his insurance which has caused the price to increase. Please advise. [phone]

## 2019-06-08 DIAGNOSIS — I10 Essential (primary) hypertension: Secondary | ICD-10-CM

## 2019-06-08 DIAGNOSIS — K7581 Nonalcoholic steatohepatitis (NASH): Secondary | ICD-10-CM

## 2019-06-08 DIAGNOSIS — E876 Hypokalemia: Secondary | ICD-10-CM

## 2019-06-08 DIAGNOSIS — T502X5A Adverse effect of carbonic-anhydrase inhibitors, benzothiadiazides and other diuretics, initial encounter: Secondary | ICD-10-CM

## 2019-06-08 NOTE — Telephone Encounter (Signed)
New message   Call transfer from the [facility].   Patient returning a call back to the [place] .

## 2019-06-09 MED ORDER — NEBIVOLOL HCL 10 MG PO TABS: 10.0000 mg | ORAL_TABLET | Freq: Every day | ORAL | 1 refills | Status: DC

## 2019-06-09 MED ORDER — PIOGLITAZONE HCL 15 MG PO TABS: 15.0000 mg | ORAL_TABLET | Freq: Every day | ORAL | 1 refills | Status: DC

## 2019-06-09 MED ORDER — POTASSIUM CHLORIDE CRYS ER 20 MEQ PO TBCR: 20.0000 meq | EXTENDED_RELEASE_TABLET | Freq: Every day | ORAL | 1 refills | Status: DC

## 2019-06-09 MED ORDER — GABAPENTIN 300 MG PO CAPS: 300.0000 mg | ORAL_CAPSULE | Freq: Three times a day (TID) | ORAL | 3 refills | Status: DC

## 2019-06-09 MED ORDER — CHLORTHALIDONE 25 MG PO TABS: 25.0000 mg | ORAL_TABLET | Freq: Every day | ORAL | 1 refills | Status: DC

## 2019-06-09 NOTE — Telephone Encounter (Signed)
Key: [id number]  PA has been approved.  Pt informed of same. Pt informed to contact pharmacy.   Pt stated that he does not remember his mychart password. This has been reset.

## 2019-06-09 NOTE — Telephone Encounter (Signed)
Telephone encounter already created.

## 2019-07-05 ENCOUNTER — Ambulatory Visit (INDEPENDENT_AMBULATORY_CARE_PROVIDER_SITE_OTHER): Payer: BC Managed Care – PPO | Admitting: Family

## 2019-07-05 DIAGNOSIS — H938X2 Other specified disorders of left ear: Secondary | ICD-10-CM

## 2019-07-05 NOTE — Progress Notes (Signed)
Willie Foster is a 54 y.o. male with the following history as recorded in [employer]:  Patient Active Problem List   Diagnosis Date Noted  . Lumbar radiculopathy 01/10/2019  . GERD with esophagitis 06/30/2018  . Hemorrhoids, internal 08/18/2017  . Snoring 07/15/2017  . Seasonal allergic rhinitis due to pollen 01/12/2017  . Elevated serum creatinine 06/15/2016  . Drug-induced erectile dysfunction 02/04/2016  . Routine general medical examination at a health care facility 01/31/2014  . NASH (nonalcoholic steatohepatitis) 01/31/2014  . SI (sacroiliac) joint dysfunction 08/15/2013  . Hyperglycemia 01/11/2013  . Obesity (BMI 30-39.9) 12/07/2012  . Pulmonary embolism ([place]) 11/13/2012  . Diuretic-induced hypokalemia 09/16/2012  . BPH (benign prostatic hyperplasia) 09/15/2012  . Essential hypertension, benign 07/20/2012  . Hyperlipidemia with target LDL less than 130 07/20/2012  . Herpes labialis 01/06/2011    Current Outpatient Medications  Medication Sig Dispense Refill  . acyclovir (ZOVIRAX) 400 MG tablet TAKE 1 TABLET BY MOUTH THREE TIMES DAILY AS NEEDED FOR  FEVER  BLISTERS 30 tablet 2  . chlorthalidone (HYGROTON) 25 MG tablet Take 1 tablet (25 mg total) by mouth daily. 90 tablet 1  . EQ ALLERGY RELIEF, CETIRIZINE, 10 MG tablet Take 1 tablet by mouth once daily 90 tablet 1  . gabapentin (NEURONTIN) 300 MG capsule Take 1 capsule (300 mg total) by mouth 3 (three) times daily. 90 capsule 3  . nebivolol (BYSTOLIC) 10 MG tablet Take 1 tablet (10 mg total) by mouth daily. 90 tablet 1  . Omega-3 Fatty Acids (FISH OIL PO) Take by mouth.    . pioglitazone (ACTOS) 15 MG tablet Take 1 tablet (15 mg total) by mouth daily. 90 tablet 1  . Pitavastatin Calcium (LIVALO) 4 MG TABS Take 1 tablet (4 mg total) by mouth daily. 90 tablet 1  . potassium chloride SA (KLOR-CON M20) 20 MEQ tablet Take 1 tablet (20 mEq total) by mouth daily. 90 tablet 1  . sildenafil (VIAGRA) 100 MG tablet Take 1 tablet (100 mg  total) by mouth daily as needed for erectile dysfunction. 10 tablet 11  . tiZANidine (ZANAFLEX) 4 MG tablet TAKE 1 TABLET BY MOUTH EVERY 6 HOURS AS NEEDED FOR MUSCLE SPASM 30 tablet 0  . Turmeric 500 MG CAPS Take 1 tablet by mouth 2 (two) times daily.    . vitamin C (ASCORBIC ACID) 500 MG tablet Take 500 mg by mouth daily.    Alveda Reasons 10 MG TABS tablet Take 1 tablet by mouth once daily 90 tablet 1   No current facility-administered medications for this visit.    Allergies: Lipitor [atorvastatin]  Past Medical History:  Diagnosis Date  . Elevated serum creatinine 06/15/2016   1.66 06/02/16  . Fatty liver disease, nonalcoholic 5784  . Hyperlipidemia   . Hypertension   . Leukopenia 01/18/2013   WBC 3,400 01/18/13 was normal 8/22 Xarelto?  . Pulmonary embolism ([place]) 10/2012   Right lung    No past surgical history on file.  Family History  Problem Relation Age of Onset  . Hypertension Father   . Alcohol abuse Neg Hx   . COPD Neg Hx   . Diabetes Neg Hx   . Drug abuse Neg Hx   . Early death Neg Hx   . Heart disease Neg Hx   . Hyperlipidemia Neg Hx   . Kidney disease Neg Hx   . Stroke Neg Hx   . Lung cancer Father   . Breast cancer Mother        mets to  brain    Social History   Tobacco Use  . Smoking status: Never Smoker  . Smokeless tobacco: Never Used  Substance Use Topics  . Alcohol use: Yes    Alcohol/week: 0.0 standard drinks    Comment: occasional-twice a week    Subjective:    I connected with [name] on 07/05/19 at 11:20 AM EST by a video enabled telemedicine application and verified that I am speaking with the correct person using two identifiers.   I discussed the limitations of evaluation and management by telemedicine and the availability of in person appointments. The patient expressed understanding and agreed to proceed. Provider in office/ patient is at home; provider and patient are only 2 people on video call.   Patient had an episode yesterday  where his left ear felt clogged for about 30 minutes; "felt like when you are flying." Ear actually normalized on its own; used peroxide yesterday in the ear and has felt fine since that time; Scheduled the appointment yesterday because he was concerned about what symptoms would develop;     Objective:  There were no vitals filed for this visit.  General: Well developed, well nourished, in no acute distress  Head: Normocephalic and atraumatic  Lungs: Respirations unlabored;  Neurologic: Alert and oriented; speech intact; face symmetrical;   Assessment:  1. Clogged ear, left     Plan:  Per patient, symptoms resolved within 30 minutes and no further symptoms; if re-occur, he will plan to be seen in the office- may need to have ear lavage.   No follow-ups on file.  No orders of the defined types were placed in this encounter.   Requested Prescriptions    No prescriptions requested or ordered in this encounter

## 2019-07-06 ENCOUNTER — Encounter: Payer: Self-pay | Admitting: Internal Medicine

## 2019-07-06 ENCOUNTER — Ambulatory Visit: Payer: BC Managed Care – PPO | Admitting: Internal Medicine

## 2019-07-06 ENCOUNTER — Other Ambulatory Visit: Payer: Self-pay

## 2019-07-06 DIAGNOSIS — H938X2 Other specified disorders of left ear: Secondary | ICD-10-CM

## 2019-07-06 DIAGNOSIS — G479 Sleep disorder, unspecified: Secondary | ICD-10-CM | POA: Diagnosis not present

## 2019-07-06 NOTE — Progress Notes (Signed)
Subjective:    Patient ID: Willie Foster, male    DOB: Feb 14, 1966, 54 y.o.   MRN: [id number]  HPI The patient is here for an acute visit.   Left ear clogged:  His symptoms started three days ago.  He was on the phone and the ear suddenly became clogged.  He denies any pain or discomfort.  He states decreased hearing.  He denies any itching or feeling of water in the ear.  He denies cold symptoms.    After the symptoms started he tried putting peroxide in the ear and used a q-tip and had a little "junk" come out.  His ear felt better - it was not clogged.  He was ok until 1 am this morning his ear became clogged again.    He also states difficulty sleeping and wonder if I would prescribe him ambien.    Medications and allergies reviewed with patient and updated if appropriate.  Patient Active Problem List   Diagnosis Date Noted  . Lumbar radiculopathy 01/10/2019  . GERD with esophagitis 06/30/2018  . Hemorrhoids, internal 08/18/2017  . Snoring 07/15/2017  . Seasonal allergic rhinitis due to pollen 01/12/2017  . Elevated serum creatinine 06/15/2016  . Drug-induced erectile dysfunction 02/04/2016  . Routine general medical examination at a health care facility 01/31/2014  . NASH (nonalcoholic steatohepatitis) 01/31/2014  . SI (sacroiliac) joint dysfunction 08/15/2013  . Hyperglycemia 01/11/2013  . Obesity (BMI 30-39.9) 12/07/2012  . Pulmonary embolism ([place]) 11/13/2012  . Diuretic-induced hypokalemia 09/16/2012  . BPH (benign prostatic hyperplasia) 09/15/2012  . Essential hypertension, benign 07/20/2012  . Hyperlipidemia with target LDL less than 130 07/20/2012  . Herpes labialis 01/06/2011    Current Outpatient Medications on File Prior to Visit  Medication Sig Dispense Refill  . acyclovir (ZOVIRAX) 400 MG tablet TAKE 1 TABLET BY MOUTH THREE TIMES DAILY AS NEEDED FOR  FEVER  BLISTERS 30 tablet 2  . chlorthalidone (HYGROTON) 25 MG tablet Take 1 tablet (25 mg total) by mouth  daily. 90 tablet 1  . EQ ALLERGY RELIEF, CETIRIZINE, 10 MG tablet Take 1 tablet by mouth once daily 90 tablet 1  . gabapentin (NEURONTIN) 300 MG capsule Take 1 capsule (300 mg total) by mouth 3 (three) times daily. 90 capsule 3  . nebivolol (BYSTOLIC) 10 MG tablet Take 1 tablet (10 mg total) by mouth daily. 90 tablet 1  . Omega-3 Fatty Acids (FISH OIL PO) Take by mouth.    . pioglitazone (ACTOS) 15 MG tablet Take 1 tablet (15 mg total) by mouth daily. 90 tablet 1  . Pitavastatin Calcium (LIVALO) 4 MG TABS Take 1 tablet (4 mg total) by mouth daily. 90 tablet 1  . potassium chloride SA (KLOR-CON M20) 20 MEQ tablet Take 1 tablet (20 mEq total) by mouth daily. 90 tablet 1  . sildenafil (VIAGRA) 100 MG tablet Take 1 tablet (100 mg total) by mouth daily as needed for erectile dysfunction. 10 tablet 11  . tiZANidine (ZANAFLEX) 4 MG tablet TAKE 1 TABLET BY MOUTH EVERY 6 HOURS AS NEEDED FOR MUSCLE SPASM 30 tablet 0  . Turmeric 500 MG CAPS Take 1 tablet by mouth 2 (two) times daily.    . vitamin C (ASCORBIC ACID) 500 MG tablet Take 500 mg by mouth daily.    Alveda Reasons 10 MG TABS tablet Take 1 tablet by mouth once daily 90 tablet 1   No current facility-administered medications on file prior to visit.    Past Medical History:  Diagnosis  Date  . Elevated serum creatinine 06/15/2016   1.66 06/02/16  . Fatty liver disease, nonalcoholic 1779  . Hyperlipidemia   . Hypertension   . Leukopenia 01/18/2013   WBC 3,400 01/18/13 was normal 8/22 Xarelto?  . Pulmonary embolism ([place]) 10/2012   Right lung    No past surgical history on file.  Social History   Socioeconomic History  . Marital status: Single    Spouse name: Not on file  . Number of children: 0  . Years of education: Not on file  . Highest education level: Not on file  Occupational History  . Occupation: Manufacturing engineer: Engineer, manufacturing  Tobacco Use  . Smoking status: Never Smoker  . Smokeless tobacco: Never Used    Substance and Sexual Activity  . Alcohol use: Yes    Alcohol/week: 0.0 standard drinks    Comment: occasional-twice a week  . Drug use: No  . Sexual activity: Not on file  Other Topics Concern  . Not on file  Social History Narrative  . Not on file   Social Determinants of Health   Financial Resource Strain:   . Difficulty of Paying Living Expenses: Not on file  Food Insecurity:   . Worried About Charity fundraiser in the Last Year: Not on file  . Ran Out of Food in the Last Year: Not on file  Transportation Needs:   . Lack of Transportation (Medical): Not on file  . Lack of Transportation (Non-Medical): Not on file  Physical Activity:   . Days of Exercise per Week: Not on file  . Minutes of Exercise per Session: Not on file  Stress:   . Feeling of Stress : Not on file  Social Connections:   . Frequency of Communication with Friends and Family: Not on file  . Frequency of Social Gatherings with Friends and Family: Not on file  . Attends Religious Services: Not on file  . Active Member of Clubs or Organizations: Not on file  . Attends Archivist Meetings: Not on file  . Marital Status: Not on file    Family History  Problem Relation Age of Onset  . Hypertension Father   . Alcohol abuse Neg Hx   . COPD Neg Hx   . Diabetes Neg Hx   . Drug abuse Neg Hx   . Early death Neg Hx   . Heart disease Neg Hx   . Hyperlipidemia Neg Hx   . Kidney disease Neg Hx   . Stroke Neg Hx   . Lung cancer Father   . Breast cancer Mother        mets to brain    Review of Systems  Constitutional: Negative for chills and fever.  HENT: Positive for hearing loss. Negative for ear pain, sinus pain and sore throat.   Respiratory: Negative for cough, shortness of breath and wheezing.   Neurological: Negative for dizziness and headaches.       Objective:   Vitals:   07/06/19 1008  BP: 124/80  Pulse: (!) 54  Resp: 16  Temp: 98.3 F (36.8 C)  SpO2: 97%   BP Readings  from Last 3 Encounters:  07/06/19 124/80  03/21/19 138/82  02/15/19 120/86   Wt Readings from Last 3 Encounters:  07/06/19 216 lb (98 kg)  03/21/19 215 lb (97.5 kg)  02/15/19 213 lb (96.6 kg)   Body mass index is 31.9 kg/m.   Physical Exam Constitutional:  General: He is not in acute distress.    Appearance: Normal appearance. He is not ill-appearing.  HENT:     Head: Normocephalic and atraumatic.     Right Ear: Tympanic membrane, ear canal and external ear normal.     Left Ear: External ear normal. There is impacted cerumen.  Skin:    General: Skin is warm and dry.  Neurological:     Mental Status: He is alert.       After ear lavage ear exam:  Left Ear:  External ear normal.  Ear canal with no wax or erythema.  TM intact and normal appearing w/o erythema      Assessment & Plan:    See Problem List for Assessment and Plan of chronic medical problems.    This visit occurred during the SARS-CoV-2 public health emergency.  Safety protocols were in place, including screening questions prior to the visit, additional usage of staff PPE, and extensive cleaning of exam room while observing appropriate contact time as indicated for disinfecting solutions.

## 2019-07-06 NOTE — Assessment & Plan Note (Signed)
Acute Left ear cleaned out successfully  Clogged sensation resolved Ear w/o signs of infection

## 2019-07-06 NOTE — Assessment & Plan Note (Signed)
Acute  Having sleep difficulties and requested ambien Discussed possible side effects of sleep medications Discussed good sleep hygiene, regular exercise Advised to try otc natural sleep aids such as melatonin

## 2019-07-06 NOTE — Patient Instructions (Signed)
Your left ear was cleaned out today.   For your sleep try melatonin or a sleep aid with melatonin and other natural herbs in it.  Practice good sleep habits, such as going to bed and waking up at the same time.  Continue regular exercise.   If your sleep does not improve discuss further with Dr Ronnald Ramp.

## 2019-07-06 NOTE — Progress Notes (Signed)
Patient consent obtained. Irrigation with water and peroxide performed. Full view of tympanic membranes after procedure.  Patient tolerated procedure well. Left ear

## 2019-07-20 ENCOUNTER — Other Ambulatory Visit: Payer: Self-pay

## 2019-07-20 ENCOUNTER — Ambulatory Visit: Payer: BC Managed Care – PPO | Admitting: Internal Medicine

## 2019-07-20 ENCOUNTER — Encounter: Payer: Self-pay | Admitting: Internal Medicine

## 2019-07-20 DIAGNOSIS — H6690 Otitis media, unspecified, unspecified ear: Secondary | ICD-10-CM | POA: Diagnosis not present

## 2019-07-20 MED ORDER — AMOXICILLIN-POT CLAVULANATE 875-125 MG PO TABS: 1.0000 | ORAL_TABLET | Freq: Two times a day (BID) | ORAL | 0 refills | Status: DC

## 2019-07-20 NOTE — Progress Notes (Signed)
Subjective:    Patient ID: Willie Foster, male    DOB: 09-Sep-1965, 54 y.o.   MRN: [id number]  HPI The patient is here for an acute visit.   His left ear felt like there was some fluid in it.  He denies pressure and pain.  It feels clogged.    Medications and allergies reviewed with patient and updated if appropriate.  Patient Active Problem List   Diagnosis Date Noted  . Clogged ear, left 07/06/2019  . Sleep difficulties 07/06/2019  . Lumbar radiculopathy 01/10/2019  . GERD with esophagitis 06/30/2018  . Hemorrhoids, internal 08/18/2017  . Snoring 07/15/2017  . Seasonal allergic rhinitis due to pollen 01/12/2017  . Elevated serum creatinine 06/15/2016  . Drug-induced erectile dysfunction 02/04/2016  . Routine general medical examination at a health care facility 01/31/2014  . NASH (nonalcoholic steatohepatitis) 01/31/2014  . SI (sacroiliac) joint dysfunction 08/15/2013  . Hyperglycemia 01/11/2013  . Obesity (BMI 30-39.9) 12/07/2012  . Pulmonary embolism ([place]) 11/13/2012  . Diuretic-induced hypokalemia 09/16/2012  . BPH (benign prostatic hyperplasia) 09/15/2012  . Essential hypertension, benign 07/20/2012  . Hyperlipidemia with target LDL less than 130 07/20/2012  . Herpes labialis 01/06/2011    Current Outpatient Medications on File Prior to Visit  Medication Sig Dispense Refill  . acyclovir (ZOVIRAX) 400 MG tablet TAKE 1 TABLET BY MOUTH THREE TIMES DAILY AS NEEDED FOR  FEVER  BLISTERS 30 tablet 2  . chlorthalidone (HYGROTON) 25 MG tablet Take 1 tablet (25 mg total) by mouth daily. 90 tablet 1  . EQ ALLERGY RELIEF, CETIRIZINE, 10 MG tablet Take 1 tablet by mouth once daily 90 tablet 1  . gabapentin (NEURONTIN) 300 MG capsule Take 1 capsule (300 mg total) by mouth 3 (three) times daily. 90 capsule 3  . nebivolol (BYSTOLIC) 10 MG tablet Take 1 tablet (10 mg total) by mouth daily. 90 tablet 1  . Omega-3 Fatty Acids (FISH OIL PO) Take by mouth.    . pioglitazone (ACTOS) 15  MG tablet Take 1 tablet (15 mg total) by mouth daily. 90 tablet 1  . Pitavastatin Calcium (LIVALO) 4 MG TABS Take 1 tablet (4 mg total) by mouth daily. 90 tablet 1  . potassium chloride SA (KLOR-CON M20) 20 MEQ tablet Take 1 tablet (20 mEq total) by mouth daily. 90 tablet 1  . sildenafil (VIAGRA) 100 MG tablet Take 1 tablet (100 mg total) by mouth daily as needed for erectile dysfunction. 10 tablet 11  . tiZANidine (ZANAFLEX) 4 MG tablet TAKE 1 TABLET BY MOUTH EVERY 6 HOURS AS NEEDED FOR MUSCLE SPASM 30 tablet 0  . Turmeric 500 MG CAPS Take 1 tablet by mouth 2 (two) times daily.    . vitamin C (ASCORBIC ACID) 500 MG tablet Take 500 mg by mouth daily.    Alveda Reasons 10 MG TABS tablet Take 1 tablet by mouth once daily 90 tablet 1   No current facility-administered medications on file prior to visit.    Past Medical History:  Diagnosis Date  . Elevated serum creatinine 06/15/2016   1.66 06/02/16  . Fatty liver disease, nonalcoholic 6063  . Hyperlipidemia   . Hypertension   . Leukopenia 01/18/2013   WBC 3,400 01/18/13 was normal 8/22 Xarelto?  . Pulmonary embolism ([place]) 10/2012   Right lung    No past surgical history on file.  Social History   Socioeconomic History  . Marital status: Single    Spouse name: Not on file  . Number of children: 0  .  Years of education: Not on file  . Highest education level: Not on file  Occupational History  . Occupation: Manufacturing engineer: Engineer, manufacturing  Tobacco Use  . Smoking status: Never Smoker  . Smokeless tobacco: Never Used  Substance and Sexual Activity  . Alcohol use: Yes    Alcohol/week: 0.0 standard drinks    Comment: occasional-twice a week  . Drug use: No  . Sexual activity: Not on file  Other Topics Concern  . Not on file  Social History Narrative  . Not on file   Social Determinants of Health   Financial Resource Strain:   . Difficulty of Paying Living Expenses: Not on file  Food Insecurity:   . Worried About  Charity fundraiser in the Last Year: Not on file  . Ran Out of Food in the Last Year: Not on file  Transportation Needs:   . Lack of Transportation (Medical): Not on file  . Lack of Transportation (Non-Medical): Not on file  Physical Activity:   . Days of Exercise per Week: Not on file  . Minutes of Exercise per Session: Not on file  Stress:   . Feeling of Stress : Not on file  Social Connections:   . Frequency of Communication with Friends and Family: Not on file  . Frequency of Social Gatherings with Friends and Family: Not on file  . Attends Religious Services: Not on file  . Active Member of Clubs or Organizations: Not on file  . Attends Archivist Meetings: Not on file  . Marital Status: Not on file    Family History  Problem Relation Age of Onset  . Hypertension Father   . Alcohol abuse Neg Hx   . COPD Neg Hx   . Diabetes Neg Hx   . Drug abuse Neg Hx   . Early death Neg Hx   . Heart disease Neg Hx   . Hyperlipidemia Neg Hx   . Kidney disease Neg Hx   . Stroke Neg Hx   . Lung cancer Father   . Breast cancer Mother        mets to brain    Review of Systems  Constitutional: Negative for chills and fever.  HENT: Negative for ear pain, hearing loss, sinus pain and sore throat.   Neurological: Negative for dizziness and headaches.       Objective:   Vitals:   07/20/19 0933  BP: 124/76  Pulse: 68  Resp: 16  Temp: 97.8 F (36.6 C)  SpO2: 98%   BP Readings from Last 3 Encounters:  07/20/19 124/76  07/06/19 124/80  03/21/19 138/82   Wt Readings from Last 3 Encounters:  07/20/19 214 lb 9.6 oz (97.3 kg)  07/06/19 216 lb (98 kg)  03/21/19 215 lb (97.5 kg)   Body mass index is 31.69 kg/m.   Physical Exam    GENERAL APPEARANCE: Appears stated age, well appearing, NAD EYES: conjunctiva clear, no icterus HEENT: right ear canal and tympanic membranes normal.  Left ear canal w/o cerumen or erythema, there is a small amount of fluid deep in canal  against TM, TM dull w/o erythema - probable infusion,  no thyromegaly, trachea midline, no cervical or supraclavicular lymphadenopathy SKIN: Warm, dry      Assessment & Plan:    See Problem List for Assessment and Plan of chronic medical problems.     This visit occurred during the SARS-CoV-2 public health emergency.  Safety protocols were in  place, including screening questions prior to the visit, additional usage of staff PPE, and extensive cleaning of exam room while observing appropriate contact time as indicated for disinfecting solutions.

## 2019-07-20 NOTE — Assessment & Plan Note (Signed)
Left otitis media Augmentin twice daily x1 week Continue allergy medication Advised to call if symptoms do not improve

## 2019-07-20 NOTE — Patient Instructions (Signed)
You have an ear infection.  Take the antibiotic as prescribed. If you still have ear symptoms after completing the antibiotic please let us know and I can refer to an ENT.

## 2019-07-24 ENCOUNTER — Other Ambulatory Visit: Payer: Self-pay

## 2019-07-24 NOTE — Telephone Encounter (Signed)
He want to try an oral steroid for a few days?  If so medrol dose pak is pending.  Other option is referral to ENT

## 2019-07-24 NOTE — Telephone Encounter (Signed)
New message   Seen Dr. Quay Burow on 2.25.2021   Pt c/o medication issue:  1. Name of Medication: amoxicillin-clavulanate (AUGMENTIN) 875-125 MG tablet  2. How are you currently taking this medication (dosage and times per day)? Twice a day    3. Are you having a reaction (difficulty breathing--STAT)? No   4. What is your medication issue? 7 day supply  - patient still having the same issues in left ear.

## 2019-07-25 MED ORDER — METHYLPREDNISOLONE 4 MG PO TBPK: ORAL_TABLET | ORAL | 0 refills | Status: DC

## 2019-07-25 NOTE — Telephone Encounter (Signed)
Rx sent 

## 2019-07-27 ENCOUNTER — Ambulatory Visit: Payer: BC Managed Care – PPO

## 2019-07-30 ENCOUNTER — Ambulatory Visit: Payer: BC Managed Care – PPO

## 2019-07-31 ENCOUNTER — Ambulatory Visit: Payer: BC Managed Care – PPO | Attending: Internal Medicine

## 2019-07-31 DIAGNOSIS — Z23 Encounter for immunization: Secondary | ICD-10-CM | POA: Insufficient documentation

## 2019-07-31 NOTE — Progress Notes (Signed)
   Covid-19 Vaccination Clinic  Name:  Willie Foster    MRN: [id number] DOB: 1965/12/03  07/31/2019  Mr. Willie Foster was observed post Covid-19 immunization for 15 minutes without incident. He was provided with Vaccine Information Sheet and instruction to access the V-Safe system.   Mr. Willie Foster was instructed to call 911 with any severe reactions post vaccine: Marland Kitchen Difficulty breathing  . Swelling of face and throat  . A fast heartbeat  . A bad rash all over body  . Dizziness and weakness   Immunizations Administered    Name Date Dose VIS Date Route   Pfizer COVID-19 Vaccine 07/31/2019 10:41 AM 0.3 mL 05/05/2019 Intramuscular   Manufacturer: [place]   Lot: [id number]   [place]: [id number]

## 2019-08-15 ENCOUNTER — Other Ambulatory Visit: Payer: Self-pay | Admitting: Internal Medicine

## 2019-08-15 ENCOUNTER — Telehealth: Payer: Self-pay

## 2019-08-15 DIAGNOSIS — H6690 Otitis media, unspecified, unspecified ear: Secondary | ICD-10-CM

## 2019-08-15 DIAGNOSIS — H938X2 Other specified disorders of left ear: Secondary | ICD-10-CM

## 2019-08-15 NOTE — Telephone Encounter (Signed)
New message    The patient calling asking for a referral to ENT.

## 2019-08-21 ENCOUNTER — Ambulatory Visit: Payer: BC Managed Care – PPO | Attending: Internal Medicine

## 2019-08-21 DIAGNOSIS — Z23 Encounter for immunization: Secondary | ICD-10-CM

## 2019-08-21 NOTE — Progress Notes (Signed)
   Covid-19 Vaccination Clinic  Name:  Willie Foster    MRN: [id number] DOB: 11-09-1965  08/21/2019  Mr. Willie Foster was observed post Covid-19 immunization for 15 minutes without incident. He was provided with Vaccine Information Sheet and instruction to access the V-Safe system.   Mr. Willie Foster was instructed to call 911 with any severe reactions post vaccine: Marland Kitchen Difficulty breathing  . Swelling of face and throat  . A fast heartbeat  . A bad rash all over body  . Dizziness and weakness   Immunizations Administered    Name Date Dose VIS Date Route   Pfizer COVID-19 Vaccine 08/21/2019 11:38 AM 0.3 mL 05/05/2019 Intramuscular   Manufacturer: [place]   Lot: [id number]   [place]: [id number]

## 2019-08-29 ENCOUNTER — Ambulatory Visit: Payer: BC Managed Care – PPO

## 2019-08-30 ENCOUNTER — Ambulatory Visit: Payer: BC Managed Care – PPO

## 2019-09-14 ENCOUNTER — Other Ambulatory Visit: Payer: Self-pay | Admitting: Internal Medicine

## 2019-09-14 DIAGNOSIS — I10 Essential (primary) hypertension: Secondary | ICD-10-CM

## 2019-09-14 DIAGNOSIS — E785 Hyperlipidemia, unspecified: Secondary | ICD-10-CM

## 2019-09-21 ENCOUNTER — Ambulatory Visit (INDEPENDENT_AMBULATORY_CARE_PROVIDER_SITE_OTHER): Payer: BC Managed Care – PPO | Admitting: Otolaryngology

## 2019-09-22 ENCOUNTER — Telehealth: Payer: Self-pay

## 2019-09-22 NOTE — Telephone Encounter (Signed)
New message   Per Patient calling  Need a coupon for BYSTOLIC 10 MG tablet due to cost & insurance the cost is high.

## 2019-09-25 NOTE — Telephone Encounter (Signed)
Called patient and he states that he would come by tomorrow (09/26/2019) morning and pick those up before work.

## 2019-09-25 NOTE — Telephone Encounter (Signed)
Can you ask patient how he would to get the copay card? I have some in the office.

## 2019-09-26 NOTE — Telephone Encounter (Signed)
[employer] card has been placed up front for patient to pick up at his convenience.

## 2019-10-24 ENCOUNTER — Other Ambulatory Visit: Payer: Self-pay | Admitting: Family Medicine

## 2019-10-24 ENCOUNTER — Other Ambulatory Visit: Payer: Self-pay | Admitting: Internal Medicine

## 2019-10-24 DIAGNOSIS — I10 Essential (primary) hypertension: Secondary | ICD-10-CM

## 2019-10-24 DIAGNOSIS — T502X5A Adverse effect of carbonic-anhydrase inhibitors, benzothiadiazides and other diuretics, initial encounter: Secondary | ICD-10-CM

## 2019-10-24 DIAGNOSIS — E876 Hypokalemia: Secondary | ICD-10-CM

## 2019-11-01 DIAGNOSIS — H25013 Cortical age-related cataract, bilateral: Secondary | ICD-10-CM | POA: Diagnosis not present

## 2019-11-08 ENCOUNTER — Other Ambulatory Visit: Payer: Self-pay

## 2019-11-08 ENCOUNTER — Ambulatory Visit: Payer: Self-pay

## 2019-11-08 ENCOUNTER — Ambulatory Visit: Payer: BC Managed Care – PPO | Admitting: Family Medicine

## 2019-11-08 ENCOUNTER — Encounter: Payer: Self-pay | Admitting: Family Medicine

## 2019-11-08 VITALS — BP 108/76 | HR 66 | Ht 69.0 in | Wt 219.0 lb

## 2019-11-08 DIAGNOSIS — M75112 Incomplete rotator cuff tear or rupture of left shoulder, not specified as traumatic: Secondary | ICD-10-CM

## 2019-11-08 DIAGNOSIS — M25512 Pain in left shoulder: Secondary | ICD-10-CM

## 2019-11-08 DIAGNOSIS — M75102 Unspecified rotator cuff tear or rupture of left shoulder, not specified as traumatic: Secondary | ICD-10-CM | POA: Insufficient documentation

## 2019-11-08 MED ORDER — NITROGLYCERIN 0.1 MG/HR TD PT24: MEDICATED_PATCH | TRANSDERMAL | 12 refills | Status: DC

## 2019-11-08 NOTE — Progress Notes (Signed)
[facility] [place] Phone: [phone] Subjective:   [name], am serving as a scribe for Dr. Hulan Saas. This visit occurred during the SARS-CoV-2 public health emergency.  Safety protocols were in place, including screening questions prior to the visit, additional usage of staff PPE, and extensive cleaning of exam room while observing appropriate contact time as indicated for disinfecting solutions.   I'm seeing this patient by the request  of:  [name], MD      CC: Left shoulder pain  [id number]  Willie Foster is a 54 y.o. male coming in with complaint of left shoulder pain for 2 weeks. Last seen in September 2020 for back pain. Patient states that he has been having pain in lateral aspect AC joint into middle deltoid. Constant throbbing pain. Continues to do Crossfit but has to modify exercises. Denies any radiating symptoms.      Past Medical History:  Diagnosis Date  . Elevated serum creatinine 06/15/2016   1.66 06/02/16  . Fatty liver disease, nonalcoholic 3244  . Hyperlipidemia   . Hypertension   . Leukopenia 01/18/2013   WBC 3,400 01/18/13 was normal 8/22 Xarelto?  . Pulmonary embolism ([place]) 10/2012   Right lung   No past surgical history on file. Social History   Socioeconomic History  . Marital status: Single    Spouse name: Not on file  . Number of children: 0  . Years of education: Not on file  . Highest education level: Not on file  Occupational History  . Occupation: Manufacturing engineer: Engineer, manufacturing  Tobacco Use  . Smoking status: Never Smoker  . Smokeless tobacco: Never Used  Substance and Sexual Activity  . Alcohol use: Yes    Alcohol/week: 0.0 standard drinks    Comment: occasional-twice a week  . Drug use: No  . Sexual activity: Not on file  Other Topics Concern  . Not on file  Social History Narrative  . Not on file   Social Determinants of  Health   Financial Resource Strain:   . Difficulty of Paying Living Expenses:   Food Insecurity:   . Worried About Charity fundraiser in the Last Year:   . Arboriculturist in the Last Year:   Transportation Needs:   . Film/video editor (Medical):   Marland Kitchen Lack of Transportation (Non-Medical):   Physical Activity:   . Days of Exercise per Week:   . Minutes of Exercise per Session:   Stress:   . Feeling of Stress :   Social Connections:   . Frequency of Communication with Friends and Family:   . Frequency of Social Gatherings with Friends and Family:   . Attends Religious Services:   . Active Member of Clubs or Organizations:   . Attends Archivist Meetings:   Marland Kitchen Marital Status:    Allergies  Allergen Reactions  . Lipitor [Atorvastatin]     Elevated LFT's   Family History  Problem Relation Age of Onset  . Hypertension Father   . Alcohol abuse Neg Hx   . COPD Neg Hx   . Diabetes Neg Hx   . Drug abuse Neg Hx   . Early death Neg Hx   . Heart disease Neg Hx   . Hyperlipidemia Neg Hx   . Kidney disease Neg Hx   . Stroke Neg Hx   . Lung cancer Father   . Breast  cancer Mother        mets to brain    Current Outpatient Medications (Endocrine & Metabolic):  .  methylPREDNISolone (MEDROL DOSEPAK) 4 MG TBPK tablet, 24 mg PO on day 1, then decr. by 4 mg/day x5 days .  pioglitazone (ACTOS) 15 MG tablet, Take 1 tablet (15 mg total) by mouth daily.  Current Outpatient Medications (Cardiovascular):  .  BYSTOLIC 10 MG tablet, Take 1 tablet by mouth once daily .  chlorthalidone (HYGROTON) 25 MG tablet, Take 1 tablet (25 mg total) by mouth daily. Marland Kitchen  LIVALO 4 MG TABS, Take 1 tablet by mouth once daily .  sildenafil (VIAGRA) 100 MG tablet, Take 1 tablet (100 mg total) by mouth daily as needed for erectile dysfunction. .  nitroGLYCERIN (NITRO-DUR) 0.1 mg/hr patch, 1/4 patch daily  Current Outpatient Medications (Respiratory):  [name] ALLERGY RELIEF, CETIRIZINE, 10 MG tablet,  Take 1 tablet by mouth once daily   Current Outpatient Medications (Hematological):  Marland Kitchen  XARELTO 10 MG TABS tablet, Take 1 tablet by mouth once daily  Current Outpatient Medications (Other):  .  acyclovir (ZOVIRAX) 400 MG tablet, TAKE 1 TABLET BY MOUTH THREE TIMES DAILY AS NEEDED FOR  FEVER  BLISTERS .  amoxicillin-clavulanate (AUGMENTIN) 875-125 MG tablet, Take 1 tablet by mouth 2 (two) times daily. Marland Kitchen  gabapentin (NEURONTIN) 100 MG capsule, TAKE 2 CAPSULES BY MOUTH AT BEDTIME .  gabapentin (NEURONTIN) 300 MG capsule, Take 1 capsule (300 mg total) by mouth 3 (three) times daily. .  Omega-3 Fatty Acids (FISH OIL PO), Take by mouth. .  potassium chloride SA (KLOR-CON) 20 MEQ tablet, Take 1 tablet by mouth once daily .  tiZANidine (ZANAFLEX) 4 MG tablet, TAKE 1 TABLET BY MOUTH EVERY 6 HOURS AS NEEDED FOR MUSCLE SPASM .  Turmeric 500 MG CAPS, Take 1 tablet by mouth 2 (two) times daily. .  vitamin C (ASCORBIC ACID) 500 MG tablet, Take 500 mg by mouth daily.   Reviewed prior external information including notes and imaging from  primary care provider As well as notes that were available from care everywhere and other healthcare systems.  Past medical history, social, surgical and family history all reviewed in electronic medical record.  No pertanent information unless stated regarding to the chief complaint.   Review of Systems:  No headache, visual changes, nausea, vomiting, diarrhea, constipation, dizziness, abdominal pain, skin rash, fevers, chills, night sweats, weight loss, swollen lymph nodes,  joint swelling, chest pain, shortness of breath, mood changes. POSITIVE muscle aches, body aches  Objective  Blood pressure 108/76, pulse 66, height 5' 9"  (1.753 m), weight 219 lb (99.3 kg), SpO2 97 %.   General: No apparent distress alert and oriented x3 mood and affect normal, dressed appropriately.  HEENT: Pupils equal, extraocular movements intact  Respiratory: Patient's speak in full  sentences and does not appear short of breath  Cardiovascular: No lower extremity edema, non tender, no erythema  Neuro: Cranial nerves II through XII are intact, neurovascularly intact in all extremities with 2+ DTRs and 2+ pulses.  Gait normal with good balance and coordination.  MSK: Left shoulder shows the patient does have positive impingement.  Pain with empty can but no true weakness noted.  Patient does have some pain and the terminal internal and external range of motion.  Neurovascular intact distally.  Limited musculoskeletal ultrasound was performed and interpreted by Lyndal Pulley  Limited ultrasound of patient's left shoulder shows that there is a supraspinatus tear noted.  Mild scar tissue and calcific changes noted at the insertion.  Mild retraction on the most inferior aspect of the tendon.  Very mild arthritic changes of the acromioclavicular joint.  Otherwise fairly unremarkable.  97110; 15 additional minutes spent for Therapeutic exercises as stated in above notes.  This included exercises focusing on stretching, strengthening, with significant focus on eccentric aspects.   Long term goals include an improvement in range of motion, strength, endurance as well as avoiding reinjury. Patient's frequency would include in 1-2 times a day, 3-5 times a week for a duration of 6-12 weeks. Shoulder Exercises that included:  Basic scapular stabilization to include adduction and depression of scapula Scaption, focusing on proper movement and good control Internal and External rotation utilizing a theraband, with elbow tucked at side entire time Rows with theraband   Proper technique shown and discussed handout in great detail with ATC.  All questions were discussed and answered.     Impression and Recommendations:     The above documentation has been reviewed and is accurate and complete Lyndal Pulley, DO       Note: This dictation was prepared with Dragon dictation along with  smaller phrase technology. Any transcriptional errors that result from this process are unintentional.

## 2019-11-08 NOTE — Patient Instructions (Signed)
Exercise 3 times a week  Nitroglycerin Protocol   Apply 1/4 nitroglycerin patch to affected area daily.  Change position of patch within the affected area every 24 hours.  You may experience a headache during the first 1-2 weeks of using the patch, these should subside.  If you experience headaches after beginning nitroglycerin patch treatment, you may take your preferred over the counter pain reliever.  Another side effect of the nitroglycerin patch is skin irritation or rash related to patch adhesive.  Please notify our office if you develop more severe headaches or rash, and stop the patch.  Tendon healing with nitroglycerin patch may require 12 to 24 weeks depending on the extent of injury.  Men should not use if taking Viagra, Cialis, or Levitra.   Do not use if you have migraines or rosacea.  See me again in 4-5 weeks

## 2019-11-08 NOTE — Assessment & Plan Note (Signed)
Left rotator cuff tear noted with some calcific changes.  Patient does have some very mild retraction noted.  Gust with patient normal to scar tissue I am optimistic that patient could potentially heal.  Nitroglycerin patches given and warned of potential side effects.  Work with Product/process development scientist to learn home exercises.  Follow-up again 4 to 6 weeks to further evaluate.

## 2019-11-15 DIAGNOSIS — I10 Essential (primary) hypertension: Secondary | ICD-10-CM | POA: Diagnosis not present

## 2019-11-15 DIAGNOSIS — H25013 Cortical age-related cataract, bilateral: Secondary | ICD-10-CM | POA: Diagnosis not present

## 2019-11-15 DIAGNOSIS — H25043 Posterior subcapsular polar age-related cataract, bilateral: Secondary | ICD-10-CM | POA: Diagnosis not present

## 2019-11-15 DIAGNOSIS — H2512 Age-related nuclear cataract, left eye: Secondary | ICD-10-CM | POA: Diagnosis not present

## 2019-11-15 DIAGNOSIS — H2513 Age-related nuclear cataract, bilateral: Secondary | ICD-10-CM | POA: Diagnosis not present

## 2019-11-16 ENCOUNTER — Ambulatory Visit: Payer: BC Managed Care – PPO | Admitting: Family Medicine

## 2019-11-30 ENCOUNTER — Other Ambulatory Visit: Payer: Self-pay | Admitting: Family Medicine

## 2019-12-04 ENCOUNTER — Ambulatory Visit (INDEPENDENT_AMBULATORY_CARE_PROVIDER_SITE_OTHER): Payer: BC Managed Care – PPO | Admitting: Family Medicine

## 2019-12-04 ENCOUNTER — Encounter: Payer: Self-pay | Admitting: Family Medicine

## 2019-12-04 ENCOUNTER — Ambulatory Visit: Payer: Self-pay

## 2019-12-04 ENCOUNTER — Other Ambulatory Visit: Payer: Self-pay

## 2019-12-04 VITALS — BP 106/72 | HR 52 | Ht 69.0 in | Wt 217.0 lb

## 2019-12-04 DIAGNOSIS — M75112 Incomplete rotator cuff tear or rupture of left shoulder, not specified as traumatic: Secondary | ICD-10-CM

## 2019-12-04 DIAGNOSIS — M25512 Pain in left shoulder: Secondary | ICD-10-CM

## 2019-12-04 DIAGNOSIS — G8929 Other chronic pain: Secondary | ICD-10-CM

## 2019-12-04 NOTE — Progress Notes (Signed)
[facility] [place] Phone: [phone] Subjective:   Willie Foster, am serving as a scribe for Dr. Hulan Saas. This visit occurred during the SARS-CoV-2 public health emergency.  Safety protocols were in place, including screening questions prior to the visit, additional usage of staff PPE, and extensive cleaning of exam room while observing appropriate contact time as indicated for disinfecting solutions.   I'm seeing this patient by the request  of:  [name], MD  CC: Shoulder pain follow-up  [id number]   11/08/2019 Left rotator cuff tear noted with some calcific changes.  Patient does have some very mild retraction noted.  Gust with patient normal to scar tissue I am optimistic that patient could potentially heal.  Nitroglycerin patches given and warned of potential side effects.  Work with Product/process development scientist to learn home exercises.  Follow-up again 4 to 6 weeks to further evaluate.  Update 12/04/2019 Willie Foster is a 54 y.o. male coming in with complaint of left shoulder pain. Was feeling better. On Thursday, he irritated shoulder doing Crossfit. Also using nitro patches.  Patient states that was doing much better and then on Thursday had an exacerbation.  Patient felt like it got a little worse again just for that day.  Since then has improved again.  Felt like he needed to be seen sooner though.     Past Medical History:  Diagnosis Date  . Elevated serum creatinine 06/15/2016   1.66 06/02/16  . Fatty liver disease, nonalcoholic 7510  . Hyperlipidemia   . Hypertension   . Leukopenia 01/18/2013   WBC 3,400 01/18/13 was normal 8/22 Xarelto?  . Pulmonary embolism ([place]) 10/2012   Right lung   Foster past surgical history on file. Social History   Socioeconomic History  . Marital status: Single    Spouse name: Not on file  . Number of children: 0  . Years of education: Not on file  . Highest education level:  Not on file  Occupational History  . Occupation: Manufacturing engineer: Engineer, manufacturing  Tobacco Use  . Smoking status: Never Smoker  . Smokeless tobacco: Never Used  Substance and Sexual Activity  . Alcohol use: Yes    Alcohol/week: 0.0 standard drinks    Comment: occasional-twice a week  . Drug use: Foster  . Sexual activity: Not on file  Other Topics Concern  . Not on file  Social History Narrative  . Not on file   Social Determinants of Health   Financial Resource Strain:   . Difficulty of Paying Living Expenses:   Food Insecurity:   . Worried About Charity fundraiser in the Last Year:   . Arboriculturist in the Last Year:   Transportation Needs:   . Film/video editor (Medical):   Marland Kitchen Lack of Transportation (Non-Medical):   Physical Activity:   . Days of Exercise per Week:   . Minutes of Exercise per Session:   Stress:   . Feeling of Stress :   Social Connections:   . Frequency of Communication with Friends and Family:   . Frequency of Social Gatherings with Friends and Family:   . Attends Religious Services:   . Active Member of Clubs or Organizations:   . Attends Archivist Meetings:   Marland Kitchen Marital Status:    Allergies  Allergen Reactions  . Lipitor [Atorvastatin]     Elevated LFT's   Family History  Problem Relation Age of Onset  . Hypertension Father   . Alcohol abuse Neg Hx   . COPD Neg Hx   . Diabetes Neg Hx   . Drug abuse Neg Hx   . Early death Neg Hx   . Heart disease Neg Hx   . Hyperlipidemia Neg Hx   . Kidney disease Neg Hx   . Stroke Neg Hx   . Lung cancer Father   . Breast cancer Mother        mets to brain    Current Outpatient Medications (Endocrine & Metabolic):  .  methylPREDNISolone (MEDROL DOSEPAK) 4 MG TBPK tablet, 24 mg PO on day 1, then decr. by 4 mg/day x5 days .  pioglitazone (ACTOS) 15 MG tablet, Take 1 tablet (15 mg total) by mouth daily.  Current Outpatient Medications (Cardiovascular):  .  BYSTOLIC 10  MG tablet, Take 1 tablet by mouth once daily .  chlorthalidone (HYGROTON) 25 MG tablet, Take 1 tablet (25 mg total) by mouth daily. Marland Kitchen  LIVALO 4 MG TABS, Take 1 tablet by mouth once daily .  nitroGLYCERIN (NITRO-DUR) 0.1 mg/hr patch, 1/4 patch daily .  sildenafil (VIAGRA) 100 MG tablet, Take 1 tablet (100 mg total) by mouth daily as needed for erectile dysfunction.  Current Outpatient Medications (Respiratory):  [name] ALLERGY RELIEF, CETIRIZINE, 10 MG tablet, Take 1 tablet by mouth once daily   Current Outpatient Medications (Hematological):  Marland Kitchen  XARELTO 10 MG TABS tablet, Take 1 tablet by mouth once daily  Current Outpatient Medications (Other):  .  acyclovir (ZOVIRAX) 400 MG tablet, TAKE 1 TABLET BY MOUTH THREE TIMES DAILY AS NEEDED FOR  FEVER  BLISTERS .  amoxicillin-clavulanate (AUGMENTIN) 875-125 MG tablet, Take 1 tablet by mouth 2 (two) times daily. Marland Kitchen  gabapentin (NEURONTIN) 100 MG capsule, TAKE 2 CAPSULES BY MOUTH AT BEDTIME .  gabapentin (NEURONTIN) 300 MG capsule, Take 1 capsule (300 mg total) by mouth 3 (three) times daily. .  Omega-3 Fatty Acids (FISH OIL PO), Take by mouth. .  potassium chloride SA (KLOR-CON) 20 MEQ tablet, Take 1 tablet by mouth once daily .  tiZANidine (ZANAFLEX) 4 MG tablet, TAKE 1 TABLET BY MOUTH EVERY 6 HOURS AS NEEDED FOR MUSCLE SPASM .  Turmeric 500 MG CAPS, Take 1 tablet by mouth 2 (two) times daily. .  vitamin C (ASCORBIC ACID) 500 MG tablet, Take 500 mg by mouth daily.   Reviewed prior external information including notes and imaging from  primary care provider As well as notes that were available from care everywhere and other healthcare systems.  Past medical history, social, surgical and family history all reviewed in electronic medical record.  Foster pertanent information unless stated regarding to the chief complaint.   Review of Systems:  Foster headache, visual changes, nausea, vomiting, diarrhea, constipation, dizziness, abdominal pain, skin rash,  fevers, chills, night sweats, weight loss, swollen lymph nodes, body aches, joint swelling, chest pain, shortness of breath, mood changes. POSITIVE muscle aches  Objective  Blood pressure 106/72, pulse (!) 52, height 5' 9"  (1.753 m), weight 217 lb (98.4 kg), SpO2 96 %.   General: Foster apparent distress alert and oriented x3 mood and affect normal, dressed appropriately.  HEENT: Pupils equal, extraocular movements intact  Respiratory: Patient's speak in full sentences and does not appear short of breath  Cardiovascular: Foster lower extremity edema, non tender, Foster erythema  Neuro: Cranial nerves II through XII are intact, neurovascularly intact in all extremities with 2+ DTRs and  2+ pulses.  Gait normal with good balance and coordination.  MSK: Left shoulder exam shows the patient still has very mild positive impingement.  Negative empty can.  Patient does have some pain with flexion greater than 95 degrees.   Limited musculoskeletal ultrasound was performed and interpreted by Lyndal Pulley  Limited ultrasound of patient's left shoulder shows rotator cuff seems to be healing to a certain degree.  Patient does have scar tissue formation noted.  Less calcific changes also noted.   Impression and Recommendations:     The above documentation has been reviewed and is accurate and complete Lyndal Pulley, DO       Note: This dictation was prepared with Dragon dictation along with smaller phrase technology. Any transcriptional errors that result from this process are unintentional.

## 2019-12-04 NOTE — Assessment & Plan Note (Signed)
Interval healing noted.  Discussed with patient to continue the nitroglycerin as long as patient is not having any significant difficulty.  Discussed that this may take another month or 2 to heal up completely.  Slowly progress with some of the lifting mechanics.  Do believe patient will do well with conservative therapy.  Follow-up again in 4 to 8 weeks

## 2019-12-04 NOTE — Patient Instructions (Signed)
Continue nitro patches Continue 50% weight Keep hands in peripheral vision and no higher than shoulder height See me at the end of the month

## 2019-12-19 ENCOUNTER — Ambulatory Visit: Payer: BC Managed Care – PPO | Admitting: Family Medicine

## 2019-12-19 ENCOUNTER — Ambulatory Visit (INDEPENDENT_AMBULATORY_CARE_PROVIDER_SITE_OTHER): Payer: BC Managed Care – PPO | Admitting: Family Medicine

## 2019-12-19 ENCOUNTER — Ambulatory Visit: Payer: Self-pay

## 2019-12-19 ENCOUNTER — Other Ambulatory Visit: Payer: Self-pay

## 2019-12-19 ENCOUNTER — Encounter: Payer: Self-pay | Admitting: Family Medicine

## 2019-12-19 VITALS — BP 110/70 | HR 63 | Ht 69.0 in | Wt 216.0 lb

## 2019-12-19 DIAGNOSIS — M75112 Incomplete rotator cuff tear or rupture of left shoulder, not specified as traumatic: Secondary | ICD-10-CM | POA: Diagnosis not present

## 2019-12-19 DIAGNOSIS — G8929 Other chronic pain: Secondary | ICD-10-CM | POA: Diagnosis not present

## 2019-12-19 DIAGNOSIS — M25512 Pain in left shoulder: Secondary | ICD-10-CM

## 2019-12-19 NOTE — Assessment & Plan Note (Signed)
Patient on ultrasound is making progress.  Rotator cuff does still show some intrasubstance tearing with some very mild retraction but it does appear that new tendon is accumulating.  Patient has improvement in range of motion and strength.  Continue the nitroglycerin.  Follow-up again in about 8 weeks

## 2019-12-19 NOTE — Progress Notes (Signed)
[place] Phone: [phone] Subjective:   [name], am serving as a scribe for Dr. Hulan Saas. This visit occurred during the SARS-CoV-2 public health emergency.  Safety protocols were in place, including screening questions prior to the visit, additional usage of staff PPE, and extensive cleaning of exam room while observing appropriate contact time as indicated for disinfecting solutions.   I'm seeing this patient by the request  of:  [name], MD  CC: Left shoulder follow-up  [id number]   12/04/2019 Interval healing noted.  Discussed with patient to continue the nitroglycerin as long as patient is not having any significant difficulty.  Discussed that this may take another month or 2 to heal up completely.  Slowly progress with some of the lifting mechanics.  Do believe patient will do well with conservative therapy.  Follow-up again in 4 to 8 weeks  Update 11/24/2019 Willie Foster is a 54 y.o. male coming in with complaint of left shoulder pain. Patient states that he has had improvement. Having little pain with his workouts. Has not used any weight with overhead movements. Handstand holds last week did produce pain. Still using nitro patches.  Would state that he is feels approximately 90% better.  Daily activities is really no pain     Past Medical History:  Diagnosis Date  . Elevated serum creatinine 06/15/2016   1.66 06/02/16  . Fatty liver disease, nonalcoholic 8182  . Hyperlipidemia   . Hypertension   . Leukopenia 01/18/2013   WBC 3,400 01/18/13 was normal 8/22 Xarelto?  . Pulmonary embolism ([place]) 10/2012   Right lung   No past surgical history on file. Social History   Socioeconomic History  . Marital status: Single    Spouse name: Not on file  . Number of children: 0  . Years of education: Not on file  . Highest education level: Not on file  Occupational History  . Occupation:  Manufacturing engineer: Engineer, manufacturing  Tobacco Use  . Smoking status: Never Smoker  . Smokeless tobacco: Never Used  Substance and Sexual Activity  . Alcohol use: Yes    Alcohol/week: 0.0 standard drinks    Comment: occasional-twice a week  . Drug use: No  . Sexual activity: Not on file  Other Topics Concern  . Not on file  Social History Narrative  . Not on file   Social Determinants of Health   Financial Resource Strain:   . Difficulty of Paying Living Expenses:   Food Insecurity:   . Worried About Charity fundraiser in the Last Year:   . Arboriculturist in the Last Year:   Transportation Needs:   . Film/video editor (Medical):   Marland Kitchen Lack of Transportation (Non-Medical):   Physical Activity:   . Days of Exercise per Week:   . Minutes of Exercise per Session:   Stress:   . Feeling of Stress :   Social Connections:   . Frequency of Communication with Friends and Family:   . Frequency of Social Gatherings with Friends and Family:   . Attends Religious Services:   . Active Member of Clubs or Organizations:   . Attends Archivist Meetings:   Marland Kitchen Marital Status:    Allergies  Allergen Reactions  . Lipitor [Atorvastatin]     Elevated LFT's   Family History  Problem Relation Age of Onset  . Hypertension Father   .  Alcohol abuse Neg Hx   . COPD Neg Hx   . Diabetes Neg Hx   . Drug abuse Neg Hx   . Early death Neg Hx   . Heart disease Neg Hx   . Hyperlipidemia Neg Hx   . Kidney disease Neg Hx   . Stroke Neg Hx   . Lung cancer Father   . Breast cancer Mother        mets to brain    Current Outpatient Medications (Endocrine & Metabolic):  .  methylPREDNISolone (MEDROL DOSEPAK) 4 MG TBPK tablet, 24 mg PO on day 1, then decr. by 4 mg/day x5 days .  pioglitazone (ACTOS) 15 MG tablet, Take 1 tablet (15 mg total) by mouth daily.  Current Outpatient Medications (Cardiovascular):  .  BYSTOLIC 10 MG tablet, Take 1 tablet by mouth once daily .   chlorthalidone (HYGROTON) 25 MG tablet, Take 1 tablet (25 mg total) by mouth daily. Marland Kitchen  LIVALO 4 MG TABS, Take 1 tablet by mouth once daily .  nitroGLYCERIN (NITRO-DUR) 0.1 mg/hr patch, 1/4 patch daily .  sildenafil (VIAGRA) 100 MG tablet, Take 1 tablet (100 mg total) by mouth daily as needed for erectile dysfunction.  Current Outpatient Medications (Respiratory):  [name] ALLERGY RELIEF, CETIRIZINE, 10 MG tablet, Take 1 tablet by mouth once daily   Current Outpatient Medications (Hematological):  Marland Kitchen  XARELTO 10 MG TABS tablet, Take 1 tablet by mouth once daily  Current Outpatient Medications (Other):  .  acyclovir (ZOVIRAX) 400 MG tablet, TAKE 1 TABLET BY MOUTH THREE TIMES DAILY AS NEEDED FOR  FEVER  BLISTERS .  amoxicillin-clavulanate (AUGMENTIN) 875-125 MG tablet, Take 1 tablet by mouth 2 (two) times daily. Marland Kitchen  gabapentin (NEURONTIN) 100 MG capsule, TAKE 2 CAPSULES BY MOUTH AT BEDTIME .  gabapentin (NEURONTIN) 300 MG capsule, Take 1 capsule (300 mg total) by mouth 3 (three) times daily. .  Omega-3 Fatty Acids (FISH OIL PO), Take by mouth. .  potassium chloride SA (KLOR-CON) 20 MEQ tablet, Take 1 tablet by mouth once daily .  tiZANidine (ZANAFLEX) 4 MG tablet, TAKE 1 TABLET BY MOUTH EVERY 6 HOURS AS NEEDED FOR MUSCLE SPASM .  Turmeric 500 MG CAPS, Take 1 tablet by mouth 2 (two) times daily. .  vitamin C (ASCORBIC ACID) 500 MG tablet, Take 500 mg by mouth daily.   Reviewed prior external information including notes and imaging from  primary care provider As well as notes that were available from care everywhere and other healthcare systems.  Past medical history, social, surgical and family history all reviewed in electronic medical record.  No pertanent information unless stated regarding to the chief complaint.   Review of Systems:  No headache, visual changes, nausea, vomiting, diarrhea, constipation, dizziness, abdominal pain, skin rash, fevers, chills, night sweats, weight loss,  swollen lymph nodes, body aches, joint swelling, chest pain, shortness of breath, mood changes. POSITIVE muscle aches  Objective  Blood pressure 110/70, pulse 63, height 5' 9"  (1.753 m), weight (!) 216 lb (98 kg), SpO2 96 %.   General: No apparent distress alert and oriented x3 mood and affect normal, dressed appropriately.  HEENT: Pupils equal, extraocular movements intact  Respiratory: Patient's speak in full sentences and does not appear short of breath  Cardiovascular: No lower extremity edema, non tender, no erythema  Neuro: Cranial nerves II through XII are intact, neurovascularly intact in all extremities with 2+ DTRs and 2+ pulses.  Gait normal with good balance and coordination.  MSK:  Left shoulder exam shows the patient does still have some mild impingement with full range of motion.  Rotator cuff strength is improved and nearly symmetric to the contralateral side  Limited musculoskeletal ultrasound was performed and interpreted by Lyndal Pulley  Limited ultrasound of patient's left shoulder shows the patient's rotator cuff tear is still appreciated but does have some interval healing noted.  Minimal retraction noted at this point of 1 partial thickness aspect of the supraspinatus.  Otherwise it does appear that previous scar tissue is now more tendon formation.    Impression and Recommendations:     The above documentation has been reviewed and is accurate and complete Lyndal Pulley, DO       Note: This dictation was prepared with Dragon dictation along with smaller phrase technology. Any transcriptional errors that result from this process are unintentional.

## 2019-12-19 NOTE — Patient Instructions (Signed)
Overall looking great 50% weight and increase 5-10% each week See me again in 6-8 weeks

## 2020-02-12 NOTE — Progress Notes (Signed)
[name] Phone: [phone] Subjective:   [name], am serving as a scribe for Dr. Hulan Saas. This visit occurred during the SARS-CoV-2 public health emergency.  Safety protocols were in place, including screening questions prior to the visit, additional usage of staff PPE, and extensive cleaning of exam room while observing appropriate contact time as indicated for disinfecting solutions.   I'm seeing this patient by the request  of:  [name], MD  CC: Left shoulder follow-up, new right foot pain  [id number]   12/19/2019 Patient on ultrasound is making progress.  Rotator cuff does still show some intrasubstance tearing with some very mild retraction but it does appear that new tendon is accumulating.  Patient has improvement in range of motion and strength.  Continue the nitroglycerin.  Follow-up again in about 8 weeks  Update 02/13/2020 Willie Foster is a 54 y.o. male coming in with complaint of left shoulder pain. Patient states that his shoulder pain has improved.   New problem. Ran on Friday and woke up Saturday morning in pain. Greater toe right foot. No history of foot pain.  Patient states that it is slowly starting to get worse over 24 hours.  Now having difficulty walking on it.     Past Medical History:  Diagnosis Date  . Elevated serum creatinine 06/15/2016   1.66 06/02/16  . Fatty liver disease, nonalcoholic 7001  . Hyperlipidemia   . Hypertension   . Leukopenia 01/18/2013   WBC 3,400 01/18/13 was normal 8/22 Xarelto?  . Pulmonary embolism ([place]) 10/2012   Right lung   No past surgical history on file. Social History   Socioeconomic History  . Marital status: Single    Spouse name: Not on file  . Number of children: 0  . Years of education: Not on file  . Highest education level: Not on file  Occupational History  . Occupation: Manufacturing engineer: Corporate treasurer  Tobacco Use  . Smoking status: Never Smoker  . Smokeless tobacco: Never Used  Substance and Sexual Activity  . Alcohol use: Yes    Alcohol/week: 0.0 standard drinks    Comment: occasional-twice a week  . Drug use: No  . Sexual activity: Not on file  Other Topics Concern  . Not on file  Social History Narrative  . Not on file   Social Determinants of Health   Financial Resource Strain:   . Difficulty of Paying Living Expenses: Not on file  Food Insecurity:   . Worried About Charity fundraiser in the Last Year: Not on file  . Ran Out of Food in the Last Year: Not on file  Transportation Needs:   . Lack of Transportation (Medical): Not on file  . Lack of Transportation (Non-Medical): Not on file  Physical Activity:   . Days of Exercise per Week: Not on file  . Minutes of Exercise per Session: Not on file  Stress:   . Feeling of Stress : Not on file  Social Connections:   . Frequency of Communication with Friends and Family: Not on file  . Frequency of Social Gatherings with Friends and Family: Not on file  . Attends Religious Services: Not on file  . Active Member of Clubs or Organizations: Not on file  . Attends Archivist Meetings: Not on file  . Marital Status: Not on file   Allergies  Allergen Reactions  .  Lipitor [Atorvastatin]     Elevated LFT's   Family History  Problem Relation Age of Onset  . Hypertension Father   . Alcohol abuse Neg Hx   . COPD Neg Hx   . Diabetes Neg Hx   . Drug abuse Neg Hx   . Early death Neg Hx   . Heart disease Neg Hx   . Hyperlipidemia Neg Hx   . Kidney disease Neg Hx   . Stroke Neg Hx   . Lung cancer Father   . Breast cancer Mother        mets to brain    Current Outpatient Medications (Endocrine & Metabolic):  .  methylPREDNISolone (MEDROL DOSEPAK) 4 MG TBPK tablet, 24 mg PO on day 1, then decr. by 4 mg/day x5 days .  pioglitazone (ACTOS) 15 MG tablet, Take 1 tablet (15 mg total) by mouth  daily.  Current Outpatient Medications (Cardiovascular):  .  BYSTOLIC 10 MG tablet, Take 1 tablet by mouth once daily .  chlorthalidone (HYGROTON) 25 MG tablet, Take 1 tablet (25 mg total) by mouth daily. Marland Kitchen  LIVALO 4 MG TABS, Take 1 tablet by mouth once daily .  nitroGLYCERIN (NITRO-DUR) 0.1 mg/hr patch, 1/4 patch daily .  sildenafil (VIAGRA) 100 MG tablet, Take 1 tablet (100 mg total) by mouth daily as needed for erectile dysfunction.  Current Outpatient Medications (Respiratory):  [name] ALLERGY RELIEF, CETIRIZINE, 10 MG tablet, Take 1 tablet by mouth once daily   Current Outpatient Medications (Hematological):  Marland Kitchen  XARELTO 10 MG TABS tablet, Take 1 tablet by mouth once daily  Current Outpatient Medications (Other):  .  acyclovir (ZOVIRAX) 400 MG tablet, TAKE 1 TABLET BY MOUTH THREE TIMES DAILY AS NEEDED FOR  FEVER  BLISTERS .  amoxicillin-clavulanate (AUGMENTIN) 875-125 MG tablet, Take 1 tablet by mouth 2 (two) times daily. Marland Kitchen  gabapentin (NEURONTIN) 100 MG capsule, TAKE 2 CAPSULES BY MOUTH AT BEDTIME .  gabapentin (NEURONTIN) 300 MG capsule, Take 1 capsule (300 mg total) by mouth 3 (three) times daily. .  Omega-3 Fatty Acids (FISH OIL PO), Take by mouth. .  potassium chloride SA (KLOR-CON) 20 MEQ tablet, Take 1 tablet by mouth once daily .  tiZANidine (ZANAFLEX) 4 MG tablet, TAKE 1 TABLET BY MOUTH EVERY 6 HOURS AS NEEDED FOR MUSCLE SPASM .  Turmeric 500 MG CAPS, Take 1 tablet by mouth 2 (two) times daily. .  vitamin C (ASCORBIC ACID) 500 MG tablet, Take 500 mg by mouth daily.   Reviewed prior external information including notes and imaging from  primary care provider As well as notes that were available from care everywhere and other healthcare systems.  Past medical history, social, surgical and family history all reviewed in electronic medical record.  No pertanent information unless stated regarding to the chief complaint.   Review of Systems:  No headache, visual changes,  nausea, vomiting, diarrhea, constipation, dizziness, abdominal pain, skin rash, fevers, chills, night sweats, weight loss, swollen lymph nodes, body aches, joint swelling, chest pain, shortness of breath, mood changes. POSITIVE muscle aches  Objective  Blood pressure 124/82, pulse (!) 57, height 5' 9"  (1.753 m), weight 222 lb (100.7 kg), SpO2 97 %.   General: No apparent distress alert and oriented x3 mood and affect normal, dressed appropriately.  HEENT: Pupils equal, extraocular movements intact  Respiratory: Patient's speak in full sentences and does not appear short of breath  Cardiovascular: No lower extremity edema, non tender, no erythema  Neuro: Cranial nerves II  through XII are intact, neurovascularly intact in all extremities with 2+ DTRs and 2+ pulses.  Gait normal with good balance and coordination.  MSK:  Non tender with full range of motion and good stability and symmetric strength and tone of , elbows, wrist, hip, knee and ankles bilaterally.  Shoulder: Left shoulder Inspection reveals no abnormalities, atrophy or asymmetry. Palpation is normal with no tenderness over AC joint or bicipital groove. ROM is full in all planes. Rotator cuff strength normal throughout. Very mild impingement Speeds and Yergason's tests normal. No labral pathology noted with negative Obrien's, negative clunk and good stability. Normal scapular function observed. No painful arc and no drop arm sign. No apprehension sign  MSK US performed of: Left shoulder This study was ordered, performed, and interpreted by Charlann Boxer D.O.  Shoulder:   Supraspinatus: Right ear has been appreciated previously shows new tendon formation noted.  Patient on dynamic testing does not have any significant impingement at the moment. Subscapularis appears to be unremarkable.  Very mild swelling of the acromioclavicular joint noted.  Patient's first toe does have a cortical irregularity noted.  Increasing Doppler flow  noted.  Consistent with a potential fracture.  Nondisplaced.  Patient does have an effusion of the first metatarsal noted of the joint. Impression: Questionable  Fracture  Impression shoulder does show good healing        Impression and Recommendations:     The above documentation has been reviewed and is accurate and complete Lyndal Pulley, DO       Note: This dictation was prepared with Dragon dictation along with smaller phrase technology. Any transcriptional errors that result from this process are unintentional.

## 2020-02-13 ENCOUNTER — Encounter: Payer: Self-pay | Admitting: Family Medicine

## 2020-02-13 ENCOUNTER — Ambulatory Visit: Payer: BC Managed Care – PPO | Admitting: Family Medicine

## 2020-02-13 ENCOUNTER — Other Ambulatory Visit: Payer: Self-pay

## 2020-02-13 ENCOUNTER — Ambulatory Visit (INDEPENDENT_AMBULATORY_CARE_PROVIDER_SITE_OTHER): Payer: BC Managed Care – PPO

## 2020-02-13 ENCOUNTER — Ambulatory Visit: Payer: Self-pay

## 2020-02-13 VITALS — BP 124/82 | HR 57 | Ht 69.0 in | Wt 222.0 lb

## 2020-02-13 DIAGNOSIS — M79674 Pain in right toe(s): Secondary | ICD-10-CM

## 2020-02-13 DIAGNOSIS — G8929 Other chronic pain: Secondary | ICD-10-CM

## 2020-02-13 DIAGNOSIS — S92424A Nondisplaced fracture of distal phalanx of right great toe, initial encounter for closed fracture: Secondary | ICD-10-CM

## 2020-02-13 DIAGNOSIS — M75112 Incomplete rotator cuff tear or rupture of left shoulder, not specified as traumatic: Secondary | ICD-10-CM | POA: Diagnosis not present

## 2020-02-13 DIAGNOSIS — M25512 Pain in left shoulder: Secondary | ICD-10-CM

## 2020-02-13 DIAGNOSIS — S92911A Unspecified fracture of right toe(s), initial encounter for closed fracture: Secondary | ICD-10-CM | POA: Insufficient documentation

## 2020-02-13 DIAGNOSIS — M19071 Primary osteoarthritis, right ankle and foot: Secondary | ICD-10-CM | POA: Diagnosis not present

## 2020-02-13 NOTE — Assessment & Plan Note (Signed)
Patient has what appears to be more of a toe fracture of the first metatarsal.  Discussed with patient about icing regimen, rigid soled shoe, seems to be nondisplaced, x-rays pending but ultrasound shows a.  Does have an effusion, discussed icing and topical anti-inflammatories.  Avoid going barefoot at home.  Avoid high impact for now.  Follow-up again 4 weeks

## 2020-02-13 NOTE — Assessment & Plan Note (Signed)
Rotator cuff seems to be significantly improved.  Has been 3 months with conservative therapy with the nitroglycerin.  Discussed continuing this until all the way out of the nitroglycerin patches.  Okay to start increasing activity as tolerated.

## 2020-02-13 NOTE — Patient Instructions (Signed)
Ice 20 minutes 2x a day Pennsaid 2x a day fingertip sized amount Post-op shoe 2-4 weeks Xray right foot See me again in 4 weeks

## 2020-02-18 ENCOUNTER — Other Ambulatory Visit: Payer: Self-pay | Admitting: Internal Medicine

## 2020-02-20 DIAGNOSIS — R070 Pain in throat: Secondary | ICD-10-CM | POA: Diagnosis not present

## 2020-02-20 DIAGNOSIS — Z20822 Contact with and (suspected) exposure to covid-19: Secondary | ICD-10-CM | POA: Diagnosis not present

## 2020-02-28 ENCOUNTER — Telehealth (INDEPENDENT_AMBULATORY_CARE_PROVIDER_SITE_OTHER): Payer: BC Managed Care – PPO | Admitting: Family

## 2020-02-28 ENCOUNTER — Other Ambulatory Visit: Payer: Self-pay

## 2020-02-28 DIAGNOSIS — J301 Allergic rhinitis due to pollen: Secondary | ICD-10-CM | POA: Diagnosis not present

## 2020-02-28 DIAGNOSIS — J019 Acute sinusitis, unspecified: Secondary | ICD-10-CM | POA: Diagnosis not present

## 2020-02-28 MED ORDER — AMOXICILLIN-POT CLAVULANATE 875-125 MG PO TABS: 1.0000 | ORAL_TABLET | Freq: Two times a day (BID) | ORAL | 0 refills | Status: AC

## 2020-02-28 NOTE — Progress Notes (Signed)
Willie Foster is a 54 y.o. male with the following history as recorded in [employer]:  Patient Active Problem List   Diagnosis Date Noted  . Toe fracture, right 02/13/2020  . Left rotator cuff tear 11/08/2019  . Otitis media 07/20/2019  . Clogged ear, left 07/06/2019  . Sleep difficulties 07/06/2019  . Lumbar radiculopathy 01/10/2019  . GERD with esophagitis 06/30/2018  . Hemorrhoids, internal 08/18/2017  . Snoring 07/15/2017  . Seasonal allergic rhinitis due to pollen 01/12/2017  . Elevated serum creatinine 06/15/2016  . Drug-induced erectile dysfunction 02/04/2016  . Routine general medical examination at a health care facility 01/31/2014  . NASH (nonalcoholic steatohepatitis) 01/31/2014  . SI (sacroiliac) joint dysfunction 08/15/2013  . Hyperglycemia 01/11/2013  . Obesity (BMI 30-39.9) 12/07/2012  . Pulmonary embolism ([place]) 11/13/2012  . Diuretic-induced hypokalemia 09/16/2012  . BPH (benign prostatic hyperplasia) 09/15/2012  . Essential hypertension, benign 07/20/2012  . Hyperlipidemia with target LDL less than 130 07/20/2012  . Herpes labialis 01/06/2011    Current Outpatient Medications  Medication Sig Dispense Refill  . acyclovir (ZOVIRAX) 400 MG tablet TAKE 1 TABLET BY MOUTH THREE TIMES DAILY AS NEEDED FOR  FEVER  BLISTERS 30 tablet 2  . amoxicillin-clavulanate (AUGMENTIN) 875-125 MG tablet Take 1 tablet by mouth 2 (two) times daily. 14 tablet 0  . amoxicillin-clavulanate (AUGMENTIN) 875-125 MG tablet Take 1 tablet by mouth 2 (two) times daily for 10 days. 20 tablet 0  . BYSTOLIC 10 MG tablet Take 1 tablet by mouth once daily 90 tablet 0  . chlorthalidone (HYGROTON) 25 MG tablet Take 1 tablet (25 mg total) by mouth daily. 90 tablet 1  . EQ ALLERGY RELIEF, CETIRIZINE, 10 MG tablet Take 1 tablet by mouth once daily 90 tablet 1  . gabapentin (NEURONTIN) 100 MG capsule TAKE 2 CAPSULES BY MOUTH AT BEDTIME 60 capsule 0  . gabapentin (NEURONTIN) 300 MG capsule Take 1 capsule  (300 mg total) by mouth 3 (three) times daily. 90 capsule 3  . LIVALO 4 MG TABS Take 1 tablet by mouth once daily 90 tablet 1  . methylPREDNISolone (MEDROL DOSEPAK) 4 MG TBPK tablet 24 mg PO on day 1, then decr. by 4 mg/day x5 days 21 tablet 0  . nitroGLYCERIN (NITRO-DUR) 0.1 mg/hr patch 1/4 patch daily 30 patch 12  . Omega-3 Fatty Acids (FISH OIL PO) Take by mouth.    . pioglitazone (ACTOS) 15 MG tablet Take 1 tablet (15 mg total) by mouth daily. 90 tablet 1  . potassium chloride SA (KLOR-CON) 20 MEQ tablet Take 1 tablet by mouth once daily 90 tablet 1  . sildenafil (VIAGRA) 100 MG tablet Take 1 tablet (100 mg total) by mouth daily as needed for erectile dysfunction. 10 tablet 11  . tiZANidine (ZANAFLEX) 4 MG tablet TAKE 1 TABLET BY MOUTH EVERY 6 HOURS AS NEEDED FOR MUSCLE SPASM 30 tablet 0  . Turmeric 500 MG CAPS Take 1 tablet by mouth 2 (two) times daily.    . valACYclovir (VALTREX) 1000 MG tablet TAKE 2 TABLETS BY MOUTH TWICE DAILY FOR 1 DAY AS DIRECTED FOR  OUTBREAK 30 tablet 0  . vitamin C (ASCORBIC ACID) 500 MG tablet Take 500 mg by mouth daily.    Alveda Reasons 10 MG TABS tablet Take 1 tablet by mouth once daily 90 tablet 1   No current facility-administered medications for this visit.    Allergies: Lipitor [atorvastatin]  Past Medical History:  Diagnosis Date  . Elevated serum creatinine 06/15/2016   1.66 06/02/16  .  Fatty liver disease, nonalcoholic 2778  . Hyperlipidemia   . Hypertension   . Leukopenia 01/18/2013   WBC 3,400 01/18/13 was normal 8/22 Xarelto?  . Pulmonary embolism ([place]) 10/2012   Right lung    No past surgical history on file.  Family History  Problem Relation Age of Onset  . Hypertension Father   . Alcohol abuse Neg Hx   . COPD Neg Hx   . Diabetes Neg Hx   . Drug abuse Neg Hx   . Early death Neg Hx   . Heart disease Neg Hx   . Hyperlipidemia Neg Hx   . Kidney disease Neg Hx   . Stroke Neg Hx   . Lung cancer Father   . Breast cancer Mother        mets to  brain    Social History   Tobacco Use  . Smoking status: Never Smoker  . Smokeless tobacco: Never Used  Substance Use Topics  . Alcohol use: Yes    Alcohol/week: 0.0 standard drinks    Comment: occasional-twice a week    Subjective:   I connected with Willie Foster on 02/28/20 at 11:40 AM EDT by a video enabled telemedicine application and verified that I am speaking with the correct person using two identifiers.   I discussed the limitations of evaluation and management by telemedicine and the availability of in person appointments. The patient expressed understanding and agreed to proceed. Provider in office/ patient is at home; provider and patient are only 2 people on video call.   Concerned for sinus infection; does have seasonal allergies; prone to recurrent sinus infection; has had negative COVID test; using OTC medication with limited benefit;    Objective:  There were no vitals filed for this visit.  General: Well developed, well nourished, in no acute distress  Head: Normocephalic and atraumatic  Lungs: Respirations unlabored;  Neurologic: Alert and oriented; speech intact; face symmetrical;   Assessment:  1. Acute sinusitis, recurrence not specified, unspecified location   2. Seasonal allergic rhinitis due to pollen     Plan:  Rx for Augmentin 875 mg bid x 10 days; continue allergy medication and medication needed for symptomatic management; increase fluids, rest and follow-up worse, no better.   No follow-ups on file.  No orders of the defined types were placed in this encounter.   Requested Prescriptions   Signed Prescriptions Disp Refills  . amoxicillin-clavulanate (AUGMENTIN) 875-125 MG tablet 20 tablet 0    Sig: Take 1 tablet by mouth 2 (two) times daily for 10 days.

## 2020-03-12 NOTE — Progress Notes (Signed)
[facility] [place] Phone: [phone] Subjective:   Willie Foster, am serving as a scribe for Dr. Hulan Saas. This visit occurred during the SARS-CoV-2 public health emergency.  Safety protocols were in place, including screening questions prior to the visit, additional usage of staff PPE, and extensive cleaning of exam room while observing appropriate contact time as indicated for disinfecting solutions.   I'm seeing this patient by the request  of:  Willie Lima, MD  CC: Foot pain and shoulder pain follow-up  [id number]   02/13/2020 Patient has what appears to be more of a toe fracture of the first metatarsal.  Discussed with patient about icing regimen, rigid soled shoe, seems to be nondisplaced, x-rays pending but ultrasound shows a.  Does have an effusion, discussed icing and topical anti-inflammatories.  Avoid going barefoot at home.  Avoid high impact for now.  Follow-up again 4 weeks  Rotator cuff seems to be significantly improved.  Has been 3 months with conservative therapy with the nitroglycerin.  Discussed continuing this until all the way out of the nitroglycerin patches.  Okay to start increasing activity as tolerated.  Update 03/12/2020 Willie Foster is a 54 y.o. male coming in with complaint of right, toe pain. Not having any pain. Today is the first day that he has worn a regular shoe. Has been using post-op shoe.        Past Medical History:  Diagnosis Date  . Elevated serum creatinine 06/15/2016   1.66 06/02/16  . Fatty liver disease, nonalcoholic 6754  . Hyperlipidemia   . Hypertension   . Leukopenia 01/18/2013   WBC 3,400 01/18/13 was normal 8/22 Xarelto?  . Pulmonary embolism ([place]) 10/2012   Right lung   Foster past surgical history on file. Social History   Socioeconomic History  . Marital status: Single    Spouse name: Not on file  . Number of children: 0  . Years of education: Not on  file  . Highest education level: Not on file  Occupational History  . Occupation: Manufacturing engineer: Engineer, manufacturing  Tobacco Use  . Smoking status: Never Smoker  . Smokeless tobacco: Never Used  Substance and Sexual Activity  . Alcohol use: Yes    Alcohol/week: 0.0 standard drinks    Comment: occasional-twice a week  . Drug use: Foster  . Sexual activity: Not on file  Other Topics Concern  . Not on file  Social History Narrative  . Not on file   Social Determinants of Health   Financial Resource Strain:   . Difficulty of Paying Living Expenses: Not on file  Food Insecurity:   . Worried About Charity fundraiser in the Last Year: Not on file  . Ran Out of Food in the Last Year: Not on file  Transportation Needs:   . Lack of Transportation (Medical): Not on file  . Lack of Transportation (Non-Medical): Not on file  Physical Activity:   . Days of Exercise per Week: Not on file  . Minutes of Exercise per Session: Not on file  Stress:   . Feeling of Stress : Not on file  Social Connections:   . Frequency of Communication with Friends and Family: Not on file  . Frequency of Social Gatherings with Friends and Family: Not on file  . Attends Religious Services: Not on file  . Active Member of Clubs or Organizations: Not on file  .  Attends Archivist Meetings: Not on file  . Marital Status: Not on file   Allergies  Allergen Reactions  . Lipitor [Atorvastatin]     Elevated LFT's   Family History  Problem Relation Age of Onset  . Hypertension Father   . Alcohol abuse Neg Hx   . COPD Neg Hx   . Diabetes Neg Hx   . Drug abuse Neg Hx   . Early death Neg Hx   . Heart disease Neg Hx   . Hyperlipidemia Neg Hx   . Kidney disease Neg Hx   . Stroke Neg Hx   . Lung cancer Father   . Breast cancer Mother        mets to brain    Current Outpatient Medications (Endocrine & Metabolic):  .  methylPREDNISolone (MEDROL DOSEPAK) 4 MG TBPK tablet, 24 mg PO on  day 1, then decr. by 4 mg/day x5 days .  pioglitazone (ACTOS) 15 MG tablet, Take 1 tablet (15 mg total) by mouth daily.  Current Outpatient Medications (Cardiovascular):  .  BYSTOLIC 10 MG tablet, Take 1 tablet by mouth once daily .  chlorthalidone (HYGROTON) 25 MG tablet, Take 1 tablet (25 mg total) by mouth daily. Marland Kitchen  LIVALO 4 MG TABS, Take 1 tablet by mouth once daily .  nitroGLYCERIN (NITRO-DUR) 0.1 mg/hr patch, 1/4 patch daily .  sildenafil (VIAGRA) 100 MG tablet, Take 1 tablet (100 mg total) by mouth daily as needed for erectile dysfunction.  Current Outpatient Medications (Respiratory):  [name] ALLERGY RELIEF, CETIRIZINE, 10 MG tablet, Take 1 tablet by mouth once daily   Current Outpatient Medications (Hematological):  Marland Kitchen  XARELTO 10 MG TABS tablet, Take 1 tablet by mouth once daily  Current Outpatient Medications (Other):  .  acyclovir (ZOVIRAX) 400 MG tablet, TAKE 1 TABLET BY MOUTH THREE TIMES DAILY AS NEEDED FOR  FEVER  BLISTERS .  gabapentin (NEURONTIN) 100 MG capsule, TAKE 2 CAPSULES BY MOUTH AT BEDTIME .  gabapentin (NEURONTIN) 300 MG capsule, Take 1 capsule (300 mg total) by mouth 3 (three) times daily. .  Omega-3 Fatty Acids (FISH OIL PO), Take by mouth. .  potassium chloride SA (KLOR-CON) 20 MEQ tablet, Take 1 tablet by mouth once daily .  tiZANidine (ZANAFLEX) 4 MG tablet, TAKE 1 TABLET BY MOUTH EVERY 6 HOURS AS NEEDED FOR MUSCLE SPASM .  Turmeric 500 MG CAPS, Take 1 tablet by mouth 2 (two) times daily. .  valACYclovir (VALTREX) 1000 MG tablet, TAKE 2 TABLETS BY MOUTH TWICE DAILY FOR 1 DAY AS DIRECTED FOR  OUTBREAK .  vitamin C (ASCORBIC ACID) 500 MG tablet, Take 500 mg by mouth daily.   Reviewed prior external information including notes and imaging from  primary care provider As well as notes that were available from care everywhere and other healthcare systems.  Past medical history, social, surgical and family history all reviewed in electronic medical record.  Foster  pertanent information unless stated regarding to the chief complaint.   Review of Systems:  Foster headache, visual changes, nausea, vomiting, diarrhea, constipation, dizziness, abdominal pain, skin rash, fevers, chills, night sweats, weight loss, swollen lymph nodes, body aches, joint swelling, chest pain, shortness of breath, mood changes. POSITIVE muscle aches  Objective  Blood pressure 124/80, pulse 76, height 5' 9"  (1.753 m), weight 218 lb (98.9 kg), SpO2 96 %.   General: Foster apparent distress alert and oriented x3 mood and affect normal, dressed appropriately.  HEENT: Pupils equal, extraocular movements intact  Respiratory: Patient's speak in full sentences and does not appear short of breath  Cardiovascular: Foster lower extremity edema, non tender, Foster erythema  Neuro: Cranial nerves II through XII are intact, neurovascularly intact in all extremities with 2+ DTRs and 2+ pulses.  Gait normal with good balance and coordination.  MSK:   Right foot exam shows the patient's 1st toe does does have some very mild hallux limitus.  Foster swelling noted.  Nontender on exam.  Patient is able to ambulate without any significant gait abnormality  Patient's left shoulder has full range of motion.  Mild positive O'Brien's.  5-5 strength of the rotator cuff.  Limited musculoskeletal ultrasound was performed and interpreted by Lyndal Pulley  Limited ultrasound of patient's right foot shows that the 1st metatarsal still has some very mild cortical irregularity just proximal to the 1st MTP joint.  Seems to be more of a reabsorption.  Otherwise fairly unremarkable with very mild arthritic changes of the MTP joint  Patient also had a ultrasound of the left shoulder.  Reviewed only the supraspinatus that did show some mild calcific changes still at the insertion.  Questionable overlying tear noted and some mild reactive bursitis still noted.    Impression and Recommendations:     The above documentation has been  reviewed and is accurate and complete Lyndal Pulley, DO

## 2020-03-13 ENCOUNTER — Other Ambulatory Visit: Payer: Self-pay

## 2020-03-13 ENCOUNTER — Ambulatory Visit: Payer: BC Managed Care – PPO | Admitting: Family Medicine

## 2020-03-13 ENCOUNTER — Encounter: Payer: Self-pay | Admitting: Family Medicine

## 2020-03-13 ENCOUNTER — Ambulatory Visit: Payer: Self-pay

## 2020-03-13 VITALS — BP 124/80 | HR 76 | Ht 69.0 in | Wt 218.0 lb

## 2020-03-13 DIAGNOSIS — M75112 Incomplete rotator cuff tear or rupture of left shoulder, not specified as traumatic: Secondary | ICD-10-CM

## 2020-03-13 DIAGNOSIS — S92424A Nondisplaced fracture of distal phalanx of right great toe, initial encounter for closed fracture: Secondary | ICD-10-CM

## 2020-03-13 DIAGNOSIS — M79674 Pain in right toe(s): Secondary | ICD-10-CM | POA: Diagnosis not present

## 2020-03-13 NOTE — Assessment & Plan Note (Signed)
Patient on ultrasound shows that patient does have more of a calcific change still of the supraspinatus noted.  We discussed the potential for advanced imaging at this time which patient declined.  Patient states that he is feeling very good overall and does not think it would change medical management.  We discussed the potential for PRP or even surgical intervention if this continues but patient states that he is sleeping comfortably and is able to work out just fine.  No change in medications.  We discussed the nitroglycerin patch and can finish that up and then discontinue.  Follow-up with me again 2 months

## 2020-03-13 NOTE — Patient Instructions (Signed)
Lets see how you do Give me an update in a month Have appt in 2 months if needed Otherwise me when you need me

## 2020-03-13 NOTE — Assessment & Plan Note (Signed)
Significant improvement at this time.  Patient's clinical irregularity of the first MTP seem significantly improved.  Reabsorption noted.  Patient can increase activity as tolerated and wear more normal shoes.  Follow-up with me again 8 weeks

## 2020-03-19 ENCOUNTER — Other Ambulatory Visit: Payer: Self-pay | Admitting: Internal Medicine

## 2020-03-19 ENCOUNTER — Other Ambulatory Visit: Payer: Self-pay | Admitting: Family Medicine

## 2020-03-19 DIAGNOSIS — E785 Hyperlipidemia, unspecified: Secondary | ICD-10-CM

## 2020-03-19 DIAGNOSIS — K7581 Nonalcoholic steatohepatitis (NASH): Secondary | ICD-10-CM

## 2020-03-20 ENCOUNTER — Other Ambulatory Visit: Payer: Self-pay | Admitting: Internal Medicine

## 2020-03-21 ENCOUNTER — Ambulatory Visit (INDEPENDENT_AMBULATORY_CARE_PROVIDER_SITE_OTHER): Payer: BC Managed Care – PPO | Admitting: Internal Medicine

## 2020-03-21 ENCOUNTER — Encounter: Payer: Self-pay | Admitting: Internal Medicine

## 2020-03-21 ENCOUNTER — Telehealth: Payer: Self-pay | Admitting: Internal Medicine

## 2020-03-21 ENCOUNTER — Other Ambulatory Visit: Payer: Self-pay

## 2020-03-21 VITALS — BP 138/88 | HR 56 | Temp 97.8°F | Resp 16 | Ht 69.0 in | Wt 214.0 lb

## 2020-03-21 DIAGNOSIS — I10 Essential (primary) hypertension: Secondary | ICD-10-CM | POA: Diagnosis not present

## 2020-03-21 DIAGNOSIS — K7581 Nonalcoholic steatohepatitis (NASH): Secondary | ICD-10-CM

## 2020-03-21 DIAGNOSIS — E785 Hyperlipidemia, unspecified: Secondary | ICD-10-CM | POA: Diagnosis not present

## 2020-03-21 DIAGNOSIS — Z0000 Encounter for general adult medical examination without abnormal findings: Secondary | ICD-10-CM

## 2020-03-21 DIAGNOSIS — Z125 Encounter for screening for malignant neoplasm of prostate: Secondary | ICD-10-CM

## 2020-03-21 DIAGNOSIS — R001 Bradycardia, unspecified: Secondary | ICD-10-CM | POA: Diagnosis not present

## 2020-03-21 DIAGNOSIS — B001 Herpesviral vesicular dermatitis: Secondary | ICD-10-CM

## 2020-03-21 DIAGNOSIS — T502X5A Adverse effect of carbonic-anhydrase inhibitors, benzothiadiazides and other diuretics, initial encounter: Secondary | ICD-10-CM

## 2020-03-21 DIAGNOSIS — E876 Hypokalemia: Secondary | ICD-10-CM

## 2020-03-21 DIAGNOSIS — Z23 Encounter for immunization: Secondary | ICD-10-CM | POA: Diagnosis not present

## 2020-03-21 LAB — CBC WITH DIFFERENTIAL/PLATELET
Basophils Absolute: 0 10*3/uL (ref 0.0–0.1)
Basophils Relative: 0.3 % (ref 0.0–3.0)
Eosinophils Absolute: 0 10*3/uL (ref 0.0–0.7)
Eosinophils Relative: 0.8 % (ref 0.0–5.0)
HCT: 42.3 % (ref 39.0–52.0)
Hemoglobin: 14.2 g/dL (ref 13.0–17.0)
Lymphocytes Relative: 29 % (ref 12.0–46.0)
Lymphs Abs: 1.2 10*3/uL (ref 0.7–4.0)
MCHC: 33.6 g/dL (ref 30.0–36.0)
MCV: 78.4 fl (ref 78.0–100.0)
Monocytes Absolute: 0.4 10*3/uL (ref 0.1–1.0)
Monocytes Relative: 9.9 % (ref 3.0–12.0)
Neutro Abs: 2.5 10*3/uL (ref 1.4–7.7)
Neutrophils Relative %: 60 % (ref 43.0–77.0)
Platelets: 160 10*3/uL (ref 150.0–400.0)
RBC: 5.4 Mil/uL (ref 4.22–5.81)
RDW: 14.1 % (ref 11.5–15.5)
WBC: 4.1 10*3/uL (ref 4.0–10.5)

## 2020-03-21 LAB — LIPID PANEL
Cholesterol: 189 mg/dL (ref 0–200)
HDL: 69.1 mg/dL (ref >39.00)
LDL Cholesterol: 98 mg/dL (ref 0–99)
NonHDL: 119.82
Total CHOL/HDL Ratio: 3
Triglycerides: 107 mg/dL (ref 0.0–149.0)
VLDL: 21.4 mg/dL (ref 0.0–40.0)

## 2020-03-21 LAB — BASIC METABOLIC PANEL
BUN: 12 mg/dL (ref 6–23)
CO2: 32 mEq/L (ref 19–32)
Calcium: 10.1 mg/dL (ref 8.4–10.5)
Chloride: 102 mEq/L (ref 96–112)
Creatinine, Ser: 1.18 mg/dL (ref 0.40–1.50)
GFR: 69.12 mL/min (ref >60.00)
Glucose, Bld: 87 mg/dL (ref 70–99)
Potassium: 4.1 mEq/L (ref 3.5–5.1)
Sodium: 143 mEq/L (ref 135–145)

## 2020-03-21 LAB — HEPATIC FUNCTION PANEL
ALT: 44 U/L (ref 0–53)
AST: 36 U/L (ref 0–37)
Albumin: 4.7 g/dL (ref 3.5–5.2)
Alkaline Phosphatase: 64 U/L (ref 39–117)
Bilirubin, Direct: 0.1 mg/dL (ref 0.0–0.3)
Total Bilirubin: 0.6 mg/dL (ref 0.2–1.2)
Total Protein: 7 g/dL (ref 6.0–8.3)

## 2020-03-21 LAB — URINALYSIS, ROUTINE W REFLEX MICROSCOPIC
Bilirubin Urine: NEGATIVE
Ketones, ur: NEGATIVE
Leukocytes,Ua: NEGATIVE
Nitrite: NEGATIVE
Specific Gravity, Urine: 1.02 (ref 1.000–1.030)
Total Protein, Urine: 30 — AB
Urine Glucose: NEGATIVE
Urobilinogen, UA: 0.2 (ref 0.0–1.0)
pH: 6 (ref 5.0–8.0)

## 2020-03-21 LAB — PSA: PSA: 2.25 ng/mL (ref 0.10–4.00)

## 2020-03-21 LAB — PROTIME-INR
INR: 1.6 ratio — ABNORMAL HIGH (ref 0.8–1.0)
Prothrombin Time: 17.6 s — ABNORMAL HIGH (ref 9.6–13.1)

## 2020-03-21 LAB — TSH: TSH: 0.75 u[IU]/mL (ref 0.35–4.50)

## 2020-03-21 MED ORDER — CHLORTHALIDONE 25 MG PO TABS: 25.0000 mg | ORAL_TABLET | Freq: Every day | ORAL | 1 refills | Status: DC

## 2020-03-21 MED ORDER — ACYCLOVIR 400 MG PO TABS: ORAL_TABLET | ORAL | 2 refills | Status: DC

## 2020-03-21 NOTE — Telephone Encounter (Signed)
For lab results, Willie Foster would prefer a phone call instead of a my chart note

## 2020-03-21 NOTE — Telephone Encounter (Signed)
Noted  

## 2020-03-21 NOTE — Progress Notes (Signed)
Subjective:  Patient ID: Willie Foster, male    DOB: August 28, 1965  Age: 54 y.o. MRN: [id number]  CC: Annual Exam, Hypertension, and Hyperlipidemia  This visit occurred during the SARS-CoV-2 public health emergency.  Safety protocols were in place, including screening questions prior to the visit, additional usage of staff PPE, and extensive cleaning of exam room while observing appropriate contact time as indicated for disinfecting solutions.    HPI Willie Foster presents for a CPX.  He is very active and has had no recent episodes of chest pain, shortness of breath, palpitations, edema, or fatigue.  He does CrossFit workouts and can run a mile or 2 without experiencing any symptoms.  He has not been monitoring his blood pressure.  He does not know if he is taking Bystolic or chlorthalidone.  He says he thinks he is taking all of his other medications.  He denies abdominal pain, nausea, vomiting, icterus, fatigue, or edema.  He tells me he is taking pioglitazone for the NASH.  Outpatient Medications Prior to Visit  Medication Sig Dispense Refill  . EQ ALLERGY RELIEF, CETIRIZINE, 10 MG tablet Take 1 tablet by mouth once daily 90 tablet 1  . gabapentin (NEURONTIN) 100 MG capsule TAKE 2 CAPSULES BY MOUTH AT BEDTIME 60 capsule 0  . gabapentin (NEURONTIN) 300 MG capsule Take 1 capsule (300 mg total) by mouth 3 (three) times daily. 90 capsule 3  . LIVALO 4 MG TABS Take 1 tablet by mouth once daily 90 tablet 0  . nitroGLYCERIN (NITRO-DUR) 0.1 mg/hr patch 1/4 patch daily 30 patch 12  . pioglitazone (ACTOS) 15 MG tablet Take 1 tablet by mouth once daily 90 tablet 0  . potassium chloride SA (KLOR-CON) 20 MEQ tablet Take 1 tablet by mouth once daily 90 tablet 1  . sildenafil (VIAGRA) 100 MG tablet Take 1 tablet (100 mg total) by mouth daily as needed for erectile dysfunction. 10 tablet 11  . tiZANidine (ZANAFLEX) 4 MG tablet TAKE 1 TABLET BY MOUTH EVERY 6 HOURS AS NEEDED FOR MUSCLE SPASM 30 tablet 0  .  Turmeric 500 MG CAPS Take 1 tablet by mouth 2 (two) times daily.    Alveda Reasons 10 MG TABS tablet Take 1 tablet by mouth once daily 90 tablet 1  . acyclovir (ZOVIRAX) 400 MG tablet TAKE 1 TABLET BY MOUTH THREE TIMES DAILY AS NEEDED FOR  FEVER  BLISTERS 30 tablet 2  . BYSTOLIC 10 MG tablet Take 1 tablet by mouth once daily 90 tablet 0  . chlorthalidone (HYGROTON) 25 MG tablet Take 1 tablet (25 mg total) by mouth daily. 90 tablet 1  . methylPREDNISolone (MEDROL DOSEPAK) 4 MG TBPK tablet 24 mg PO on day 1, then decr. by 4 mg/day x5 days 21 tablet 0  . Omega-3 Fatty Acids (FISH OIL PO) Take by mouth.    . valACYclovir (VALTREX) 1000 MG tablet TAKE 2 TABLETS BY MOUTH TWICE DAILY AS DIRECTED FOR 1 DAY 30 tablet 0  . vitamin C (ASCORBIC ACID) 500 MG tablet Take 500 mg by mouth daily.     No facility-administered medications prior to visit.    ROS Review of Systems  Constitutional: Negative.  Negative for appetite change, diaphoresis, fatigue and unexpected weight change.  HENT: Negative.   Eyes: Negative for visual disturbance.  Respiratory: Negative for cough, chest tightness, shortness of breath and wheezing.   Cardiovascular: Negative for chest pain, palpitations and leg swelling.  Gastrointestinal: Negative for abdominal pain, constipation, diarrhea, nausea and vomiting.  Endocrine: Negative.   Genitourinary: Negative.  Negative for difficulty urinating, penile swelling and scrotal swelling.  Musculoskeletal: Negative for arthralgias, back pain, myalgias and neck pain.  Skin: Negative.  Negative for color change and pallor.  Neurological: Negative.  Negative for dizziness, syncope, weakness, light-headedness and numbness.  Hematological: Negative for adenopathy. Does not bruise/bleed easily.  Psychiatric/Behavioral: Negative.     Objective:  BP 138/88 (BP Location: Left Arm, Patient Position: Sitting, Cuff Size: Large)   Pulse (!) 56   Temp 97.8 F (36.6 C) (Oral)   Resp 16   Ht 5' 9"   (1.753 m)   Wt 214 lb (97.1 kg)   SpO2 97%   BMI 31.60 kg/m   BP Readings from Last 3 Encounters:  03/21/20 138/88  03/13/20 124/80  02/13/20 124/82    Wt Readings from Last 3 Encounters:  03/21/20 214 lb (97.1 kg)  03/13/20 218 lb (98.9 kg)  02/13/20 222 lb (100.7 kg)    Physical Exam Vitals reviewed.  Constitutional:      Appearance: Normal appearance.  HENT:     Nose: Nose normal.     Mouth/Throat:     Mouth: Mucous membranes are moist.  Eyes:     General: No scleral icterus.    Conjunctiva/sclera: Conjunctivae normal.  Cardiovascular:     Rate and Rhythm: Regular rhythm. Bradycardia present.     Pulses: Normal pulses.     Heart sounds: No murmur heard.  No friction rub. No gallop.      Comments: EKG - Sinus bradycardia, 48 bpm Otherwise normal EKG Pulmonary:     Effort: Pulmonary effort is normal.     Breath sounds: No stridor. No wheezing, rhonchi or rales.  Abdominal:     General: Abdomen is flat. Bowel sounds are normal. There is no distension.     Palpations: Abdomen is soft. There is no hepatomegaly, splenomegaly or mass.     Tenderness: There is no abdominal tenderness.     Hernia: No hernia is present. There is no hernia in the right inguinal area.  Genitourinary:    Pubic Area: No rash.      Penis: Normal and circumcised. No lesions.      Testes: Normal.     Epididymis:     Right: Normal.     Left: Normal.     Prostate: Normal. Not enlarged, not tender and no nodules present.     Rectum: Normal. Guaiac result negative. No mass, tenderness, anal fissure, external hemorrhoid or internal hemorrhoid. Normal anal tone.  Musculoskeletal:     Cervical back: Neck supple.     Right lower leg: No edema.     Left lower leg: No edema.  Lymphadenopathy:     Cervical: No cervical adenopathy.     Lower Body: No right inguinal adenopathy. No left inguinal adenopathy.  Neurological:     Mental Status: He is alert.     Lab Results  Component Value Date    WBC 4.1 03/21/2020   HGB 14.2 03/21/2020   HCT 42.3 03/21/2020   PLT 160.0 03/21/2020   GLUCOSE 87 03/21/2020   CHOL 189 03/21/2020   TRIG 107.0 03/21/2020   HDL 69.10 03/21/2020   LDLCALC 98 03/21/2020   ALT 44 03/21/2020   AST 36 03/21/2020   NA 143 03/21/2020   K 4.1 03/21/2020   CL 102 03/21/2020   CREATININE 1.18 03/21/2020   BUN 12 03/21/2020   CO2 32 03/21/2020   TSH 0.75 03/21/2020  PSA 2.25 03/21/2020   INR 1.6 (H) 03/21/2020   HGBA1C 5.6 12/19/2014    DG Lumbar Spine Complete  Result Date: 02/15/2019 CLINICAL DATA:  Lower back pain AND rt side leg pain x 4 wks, NKI. EXAM: LUMBAR SPINE - COMPLETE 4+ VIEW COMPARISON:  Lumbar spine radiographs 12/29/2013 FINDINGS: Five non-rib-bearing lumbar-type vertebral bodies. Normal spinal alignment. No evidence of new listhesis. Vertebral body heights are maintained without evidence of fracture. There is moderate disc space loss at L4-5 and L5-S1 with associated endplate sclerosis and minimal anterior spurring. Lower lumbar facet hypertrophy. Nonobstructive bowel gas pattern. The lung bases are clear. IMPRESSION: 1. No acute osseous abnormality. 2. Moderate degenerative disc disease at L4-5 and L5-S1. Electronically Signed   By: Audie Pinto M.D.   On: 02/15/2019 13:36    Assessment & Plan:   Herchel was seen today for annual exam, hypertension and hyperlipidemia.  Diagnoses and all orders for this visit:  Essential hypertension, benign- His blood pressure is not adequately well controlled.  He is bradycardic.  I recommended that he avoid nebivolol.  Will restart chlorthalidone.  His labs are negative for secondary causes or endorgan damage. -     CBC with Differential/Platelet; Future -     Basic metabolic panel; Future -     Urinalysis, Routine w reflex microscopic; Future -     chlorthalidone (HYGROTON) 25 MG tablet; Take 1 tablet (25 mg total) by mouth daily. -     Urinalysis, Routine w reflex microscopic -     Basic  metabolic panel -     CBC with Differential/Platelet  NASH (nonalcoholic steatohepatitis)- His LFTs remain very mildly elevated.  Will continue the current dose of pioglitazone.  I have asked him to avoid alcohol and other agents that would be toxic to the liver. -     Hepatic function panel; Future -     Protime-INR; Future -     Protime-INR -     Hepatic function panel  Diuretic-induced hypokalemia- His potassium level is normal now.  Routine general medical examination at a health care facility- Exam completed, labs reviewed, vaccines reviewed and updated, cancer screenings are up-to-date, patient education was given. -     Lipid panel; Future -     PSA; Future -     PSA -     Lipid panel  Hyperlipidemia with target LDL less than 130- He has achieved his LDL goal and is doing well on the statin. -     TSH; Future -     TSH  Recurrent cold sores- He prefers acyclovir over Valacyclovir to treat his cold sores. -     acyclovir (ZOVIRAX) 400 MG tablet; TAKE 1 TABLET BY MOUTH THREE TIMES DAILY AS NEEDED FOR  FEVER  BLISTERS  Bradycardia- He is mildly bradycardic.  He has no symptoms related to this.  No intervention needed at this time.  Will avoid beta-blockers and calcium channel blockers. -     EKG 12-Lead  Other orders -     Varicella-zoster vaccine IM (Shingrix) -     Flu Vaccine QUAD 6+ mos PF IM (Fluarix Quad PF)   I have discontinued Upton Pies's vitamin C, Omega-3 Fatty Acids (FISH OIL PO), methylPREDNISolone, Bystolic, and valACYclovir. I am also having him maintain his Turmeric, sildenafil, tiZANidine, Xarelto, gabapentin, EQ Allergy Relief (Cetirizine), potassium chloride SA, nitroGLYCERIN, pioglitazone, gabapentin, Livalo, acyclovir, and chlorthalidone.  Meds ordered this encounter  Medications  . acyclovir (ZOVIRAX) 400 MG tablet  Sig: TAKE 1 TABLET BY MOUTH THREE TIMES DAILY AS NEEDED FOR  FEVER  BLISTERS    Dispense:  30 tablet    Refill:  2  .  chlorthalidone (HYGROTON) 25 MG tablet    Sig: Take 1 tablet (25 mg total) by mouth daily.    Dispense:  90 tablet    Refill:  1   In addition to time spent on CPE, I spent 50 minutes in preparing to see the patient by review of recent labs, imaging and procedures, obtaining and reviewing separately obtained history, communicating with the patient and family or caregiver, ordering medications, tests or procedures, and documenting clinical information in the EHR including the differential Dx, treatment, and any further evaluation and other management of 1. Essential hypertension, benign 2. NASH (nonalcoholic steatohepatitis) 3. Diuretic-induced hypokalemia 4. Hyperlipidemia goal LDL less than 130 6. Recurrent cold sores 7. Bradycardia     Follow-up: Return in about 6 months (around 09/19/2020).  Scarlette Calico, MD

## 2020-03-21 NOTE — Patient Instructions (Signed)

## 2020-03-22 MED ORDER — PIOGLITAZONE HCL 15 MG PO TABS: 15.0000 mg | ORAL_TABLET | Freq: Every day | ORAL | 0 refills | Status: DC

## 2020-03-27 ENCOUNTER — Ambulatory Visit: Payer: Self-pay

## 2020-03-27 ENCOUNTER — Ambulatory Visit (INDEPENDENT_AMBULATORY_CARE_PROVIDER_SITE_OTHER): Payer: BC Managed Care – PPO | Admitting: Family Medicine

## 2020-03-27 ENCOUNTER — Other Ambulatory Visit: Payer: Self-pay

## 2020-03-27 ENCOUNTER — Encounter: Payer: Self-pay | Admitting: Family Medicine

## 2020-03-27 VITALS — BP 124/86 | HR 60 | Ht 69.0 in | Wt 219.0 lb

## 2020-03-27 DIAGNOSIS — M79671 Pain in right foot: Secondary | ICD-10-CM | POA: Diagnosis not present

## 2020-03-27 DIAGNOSIS — M19079 Primary osteoarthritis, unspecified ankle and foot: Secondary | ICD-10-CM | POA: Diagnosis not present

## 2020-03-27 DIAGNOSIS — M2550 Pain in unspecified joint: Secondary | ICD-10-CM | POA: Diagnosis not present

## 2020-03-27 MED ORDER — PREDNISONE 50 MG PO TABS: ORAL_TABLET | ORAL | 0 refills | Status: DC

## 2020-03-27 NOTE — Patient Instructions (Addendum)
Prednisone 43m for 5 days Tart cherry extract 12046mat night Cam walker can workout but do not drive See me in 2-3 weeks

## 2020-03-27 NOTE — Assessment & Plan Note (Signed)
Patient is now only mild arthritic changes on x-ray.  We discussed with patient in a cam walker, differential does include the possibility of the uric acid deposits with some mild inflammation.  Patient still does have what appears to be a cortical irregularity of the first toe noted as well.  Patient did have this completely resolved on last ultrasound and was nearly pain-free.  Due to the severity of this and how acutely have changed concerning for the increasing uric acid.  We will hold on any type of injection at the moment or further lab testing.  Patient will follow up with me again in 3 to 5 weeks.

## 2020-03-27 NOTE — Progress Notes (Signed)
Willie Foster Phone: [phone] Subjective:   Willie Foster, am serving as a scribe for Dr. Hulan Saas. This visit occurred during the SARS-CoV-2 public health emergency.  Safety protocols were in place, including screening questions prior to the visit, additional usage of staff PPE, and extensive cleaning of exam room while observing appropriate contact time as indicated for disinfecting solutions.  I'm seeing this patient by the request  of:  [name], MD  CC: Foot pain follow-up  [id number]   03/13/2020 Patient on ultrasound shows that patient does have more of a calcific change still of the supraspinatus noted.  We discussed the potential for advanced imaging at this time which patient declined.  Patient states that he is feeling very good overall and does not think it would change medical management.  We discussed the potential for PRP or even surgical intervention if this continues but patient states that he is sleeping comfortably and is able to work out just fine.  Foster change in medications.  We discussed the nitroglycerin patch and can finish that up and then discontinue.  Follow-up with me again 2 months  Significant improvement at this time.  Patient's clinical irregularity of the first MTP seem significantly improved.  Reabsorption noted.  Patient can increase activity as tolerated and wear more normal shoes.  Follow-up with me again 8 weeks  Update 03/27/2020 Willie Foster is a 54 y.o. male coming in with complaint of foot pain. Patient states that his pain began 2 days ago. Pain in right great toe. Pain over the bunion on right foot. Pain with plantar flexion and great toe motion.  Patient states that this started 2 days ago.  Does not remember any true injury.  When asked patient did have a celebration on [employer].      Past Medical History:  Diagnosis Date  . Elevated serum creatinine  06/15/2016   1.66 06/02/16  . Fatty liver disease, nonalcoholic 3838  . Hyperlipidemia   . Hypertension   . Leukopenia 01/18/2013   WBC 3,400 01/18/13 was normal 8/22 Xarelto?  . Pulmonary embolism ([place]) 10/2012   Right lung   Foster past surgical history on file. Social History   Socioeconomic History  . Marital status: Single    Spouse name: Not on file  . Number of children: 0  . Years of education: Not on file  . Highest education level: Not on file  Occupational History  . Occupation: Manufacturing engineer: Engineer, manufacturing  Tobacco Use  . Smoking status: Never Smoker  . Smokeless tobacco: Never Used  Substance and Sexual Activity  . Alcohol use: Yes    Alcohol/week: 0.0 standard drinks    Comment: occasional-twice a week  . Drug use: Foster  . Sexual activity: Not on file  Other Topics Concern  . Not on file  Social History Narrative  . Not on file   Social Determinants of Health   Financial Resource Strain:   . Difficulty of Paying Living Expenses: Not on file  Food Insecurity:   . Worried About Charity fundraiser in the Last Year: Not on file  . Ran Out of Food in the Last Year: Not on file  Transportation Needs:   . Lack of Transportation (Medical): Not on file  . Lack of Transportation (Non-Medical): Not on file  Physical Activity:   . Days of Exercise per Week: Not on file  .  Minutes of Exercise per Session: Not on file  Stress:   . Feeling of Stress : Not on file  Social Connections:   . Frequency of Communication with Friends and Family: Not on file  . Frequency of Social Gatherings with Friends and Family: Not on file  . Attends Religious Services: Not on file  . Active Member of Clubs or Organizations: Not on file  . Attends Archivist Meetings: Not on file  . Marital Status: Not on file   Allergies  Allergen Reactions  . Lipitor [Atorvastatin]     Elevated LFT's   Family History  Problem Relation Age of Onset  . Hypertension  Father   . Alcohol abuse Neg Hx   . COPD Neg Hx   . Diabetes Neg Hx   . Drug abuse Neg Hx   . Early death Neg Hx   . Heart disease Neg Hx   . Hyperlipidemia Neg Hx   . Kidney disease Neg Hx   . Stroke Neg Hx   . Lung cancer Father   . Breast cancer Mother        mets to brain    Current Outpatient Medications (Endocrine & Metabolic):  .  pioglitazone (ACTOS) 15 MG tablet, Take 1 tablet (15 mg total) by mouth daily. .  predniSONE (DELTASONE) 50 MG tablet, Take one tablet daily for the next 5 days.  Current Outpatient Medications (Cardiovascular):  .  chlorthalidone (HYGROTON) 25 MG tablet, Take 1 tablet (25 mg total) by mouth daily. Marland Kitchen  LIVALO 4 MG TABS, Take 1 tablet by mouth once daily .  nitroGLYCERIN (NITRO-DUR) 0.1 mg/hr patch, 1/4 patch daily .  sildenafil (VIAGRA) 100 MG tablet, Take 1 tablet (100 mg total) by mouth daily as needed for erectile dysfunction.  Current Outpatient Medications (Respiratory):  [name] ALLERGY RELIEF, CETIRIZINE, 10 MG tablet, Take 1 tablet by mouth once daily   Current Outpatient Medications (Hematological):  Marland Kitchen  XARELTO 10 MG TABS tablet, Take 1 tablet by mouth once daily  Current Outpatient Medications (Other):  .  acyclovir (ZOVIRAX) 400 MG tablet, TAKE 1 TABLET BY MOUTH THREE TIMES DAILY AS NEEDED FOR  FEVER  BLISTERS .  gabapentin (NEURONTIN) 100 MG capsule, TAKE 2 CAPSULES BY MOUTH AT BEDTIME .  gabapentin (NEURONTIN) 300 MG capsule, Take 1 capsule (300 mg total) by mouth 3 (three) times daily. .  potassium chloride SA (KLOR-CON) 20 MEQ tablet, Take 1 tablet by mouth once daily .  tiZANidine (ZANAFLEX) 4 MG tablet, TAKE 1 TABLET BY MOUTH EVERY 6 HOURS AS NEEDED FOR MUSCLE SPASM .  Turmeric 500 MG CAPS, Take 1 tablet by mouth 2 (two) times daily.   Reviewed prior external information including notes and imaging from  primary care provider As well as notes that were available from care everywhere and other healthcare systems.  Past  medical history, social, surgical and family history all reviewed in electronic medical record.  Foster pertanent information unless stated regarding to the chief complaint.   Review of Systems:  Foster headache, visual changes, nausea, vomiting, diarrhea, constipation, dizziness, abdominal pain, skin rash, fevers, chills, night sweats, weight loss, swollen lymph nodes, body aches, joint swelling, chest pain, shortness of breath, mood changes. POSITIVE muscle aches  Objective  Blood pressure 124/86, pulse 60, height 5' 9"  (1.753 m), weight 219 lb (99.3 kg), SpO2 98 %.   General: Foster apparent distress alert and oriented x3 mood and affect normal, dressed appropriately.  HEENT: Pupils equal, extraocular  movements intact  Respiratory: Patient's speak in full sentences and does not appear short of breath  Cardiovascular: Foster lower extremity edema, non tender, Foster erythema  Neuro: Cranial nerves II through XII are intact, neurovascularly intact in all extremities with 2+ DTRs and 2+ pulses.  Gait normal with good balance and coordination.  MSK: Foot exam shows the patient does have some mild erythema of the MTP joint.  Mild warmness.  Mild limited range of motion.  Tender to palpation though more over the medial aspect of the toe. Limited ultrasound was performed and interpreted by Lyndal Pulley  Limited ultrasound of patient's first MTP joint shows mild swelling noted.  Patient does have a cortical irregularity noted just proximal to this area with increasing neovascularization in Doppler flow.  Foster masses appreciated.  Patient does have significant soft tissue inflammation noted with increasing in Doppler flow.  Mild arthritic changes noted. Impression: Nonspecific swelling of the joint with soft tissue swelling questionable uric acid   Impression and Recommendations:     The above documentation has been reviewed and is accurate and complete Lyndal Pulley, DO

## 2020-04-06 ENCOUNTER — Other Ambulatory Visit: Payer: Self-pay | Admitting: Internal Medicine

## 2020-04-06 DIAGNOSIS — I2782 Chronic pulmonary embolism: Secondary | ICD-10-CM

## 2020-04-06 DIAGNOSIS — K7581 Nonalcoholic steatohepatitis (NASH): Secondary | ICD-10-CM

## 2020-04-09 NOTE — Progress Notes (Signed)
[place] Phone: [phone] Subjective:   Willie Foster, am serving as a scribe for Dr. Hulan Foster. This visit occurred during the SARS-CoV-2 public health emergency.  Safety protocols were in place, including screening questions prior to the visit, additional usage of staff PPE, and extensive cleaning of exam room while observing appropriate contact time as indicated for disinfecting solutions.   I'm seeing this patient by the request  of:  [name], MD  CC: right foot pain follow up   [id number]   03/27/2020 Patient is now only mild arthritic changes on x-ray.  We discussed with patient in a cam walker, differential does include the possibility of the uric acid deposits with some mild inflammation.  Patient still does have what appears to be a cortical irregularity of the first toe noted as well.  Patient did have this completely resolved on last ultrasound and was nearly pain-free.  Due to the severity of this and how acutely have changed concerning for the increasing uric acid.  We will hold on any type of injection at the moment or further lab testing.  Patient will follow up with me again in 3 to 5 weeks.  Update 04/10/2020 Willie Foster is a 54 y.o. male coming in with complaint of right foot pain, 1st MTP. Patient has been wearing boot up until today. Tried wearing a tennis shoe this past weekend and did not have pain.  Patient states feeling much better at this time.  Has worn her shoe a couple times and is doing relatively well.  Wants to be able to workout on a more regular basis.         Past Medical History:  Diagnosis Date  . Elevated serum creatinine 06/15/2016   1.66 06/02/16  . Fatty liver disease, nonalcoholic 7017  . Hyperlipidemia   . Hypertension   . Leukopenia 01/18/2013   WBC 3,400 01/18/13 was normal 8/22 Xarelto?  . Pulmonary embolism ([place]) 10/2012   Right lung   Foster past  surgical history on file. Social History   Socioeconomic History  . Marital status: Single    Spouse name: Not on file  . Number of children: 0  . Years of education: Not on file  . Highest education level: Not on file  Occupational History  . Occupation: Manufacturing engineer: Engineer, manufacturing  Tobacco Use  . Smoking status: Never Smoker  . Smokeless tobacco: Never Used  Substance and Sexual Activity  . Alcohol use: Yes    Alcohol/week: 0.0 standard drinks    Comment: occasional-twice a week  . Drug use: Foster  . Sexual activity: Not on file  Other Topics Concern  . Not on file  Social History Narrative  . Not on file   Social Determinants of Health   Financial Resource Strain:   . Difficulty of Paying Living Expenses: Not on file  Food Insecurity:   . Worried About Charity fundraiser in the Last Year: Not on file  . Ran Out of Food in the Last Year: Not on file  Transportation Needs:   . Lack of Transportation (Medical): Not on file  . Lack of Transportation (Non-Medical): Not on file  Physical Activity:   . Days of Exercise per Week: Not on file  . Minutes of Exercise per Session: Not on file  Stress:   . Feeling of Stress : Not on file  Social Connections:   .  Frequency of Communication with Friends and Family: Not on file  . Frequency of Social Gatherings with Friends and Family: Not on file  . Attends Religious Services: Not on file  . Active Member of Clubs or Organizations: Not on file  . Attends Archivist Meetings: Not on file  . Marital Status: Not on file   Allergies  Allergen Reactions  . Lipitor [Atorvastatin]     Elevated LFT's   Family History  Problem Relation Age of Onset  . Hypertension Father   . Alcohol abuse Neg Hx   . COPD Neg Hx   . Diabetes Neg Hx   . Drug abuse Neg Hx   . Early death Neg Hx   . Heart disease Neg Hx   . Hyperlipidemia Neg Hx   . Kidney disease Neg Hx   . Stroke Neg Hx   . Lung cancer Father     . Breast cancer Mother        mets to brain    Current Outpatient Medications (Endocrine & Metabolic):  .  pioglitazone (ACTOS) 15 MG tablet, Take 1 tablet by mouth once daily .  predniSONE (DELTASONE) 50 MG tablet, Take one tablet daily for the next 5 days.  Current Outpatient Medications (Cardiovascular):  .  chlorthalidone (HYGROTON) 25 MG tablet, Take 1 tablet (25 mg total) by mouth daily. Marland Kitchen  LIVALO 4 MG TABS, Take 1 tablet by mouth once daily .  nitroGLYCERIN (NITRO-DUR) 0.1 mg/hr patch, 1/4 patch daily .  sildenafil (VIAGRA) 100 MG tablet, Take 1 tablet (100 mg total) by mouth daily as needed for erectile dysfunction.  Current Outpatient Medications (Respiratory):  [name] ALLERGY RELIEF, CETIRIZINE, 10 MG tablet, Take 1 tablet by mouth once daily   Current Outpatient Medications (Hematological):  Marland Kitchen  XARELTO 10 MG TABS tablet, Take 1 tablet by mouth once daily  Current Outpatient Medications (Other):  .  acyclovir (ZOVIRAX) 400 MG tablet, TAKE 1 TABLET BY MOUTH THREE TIMES DAILY AS NEEDED FOR  FEVER  BLISTERS .  gabapentin (NEURONTIN) 100 MG capsule, TAKE 2 CAPSULES BY MOUTH AT BEDTIME .  gabapentin (NEURONTIN) 300 MG capsule, Take 1 capsule (300 mg total) by mouth 3 (three) times daily. .  potassium chloride SA (KLOR-CON) 20 MEQ tablet, Take 1 tablet by mouth once daily .  tiZANidine (ZANAFLEX) 4 MG tablet, TAKE 1 TABLET BY MOUTH EVERY 6 HOURS AS NEEDED FOR MUSCLE SPASM .  Turmeric 500 MG CAPS, Take 1 tablet by mouth 2 (two) times daily.   Reviewed prior external information including notes and imaging from  primary care provider As well as notes that were available from care everywhere and other healthcare systems.  Past medical history, social, surgical and family history all reviewed in electronic medical record.  Foster pertanent information unless stated regarding to the chief complaint.   Review of Systems:  Foster headache, visual changes, nausea, vomiting, diarrhea,  constipation, dizziness, abdominal pain, skin rash, fevers, chills, night sweats, weight loss, swollen lymph nodes, body aches, joint swelling, chest pain, shortness of breath, mood changes. POSITIVE muscle aches  Objective  Blood pressure 110/76, pulse 70, height 5' 9"  (1.753 m), weight 215 lb (97.5 kg), SpO2 98 %.   General: Foster apparent distress alert and oriented x3 mood and affect normal, dressed appropriately.  HEENT: Pupils equal, extraocular movements intact  Respiratory: Patient's speak in full sentences and does not appear short of breath  Cardiovascular: Foster lower extremity edema, non tender, Foster erythema  Patient's right foot shows the patient's first toe still has some very mild hallux limitus but Foster swelling noted.  Patient does have good range of motion.  Limited musculoskeletal ultrasound was performed and interpreted by Lyndal Pulley  Limited ultrasound of patient's first toe shows that patient has very minimal effusion still noted.  Nearly completely resolved at this time. Impression: Mild first toe arthritis with improvement in the effusion   Impression and Recommendations:     The above documentation has been reviewed and is accurate and complete Lyndal Pulley, DO

## 2020-04-11 ENCOUNTER — Other Ambulatory Visit: Payer: Self-pay

## 2020-04-11 ENCOUNTER — Encounter: Payer: Self-pay | Admitting: Family Medicine

## 2020-04-11 ENCOUNTER — Ambulatory Visit (INDEPENDENT_AMBULATORY_CARE_PROVIDER_SITE_OTHER): Payer: BC Managed Care – PPO | Admitting: Family Medicine

## 2020-04-11 ENCOUNTER — Ambulatory Visit: Payer: Self-pay

## 2020-04-11 VITALS — BP 110/76 | HR 70 | Ht 69.0 in | Wt 215.0 lb

## 2020-04-11 DIAGNOSIS — M2550 Pain in unspecified joint: Secondary | ICD-10-CM

## 2020-04-11 DIAGNOSIS — M79671 Pain in right foot: Secondary | ICD-10-CM

## 2020-04-11 DIAGNOSIS — M19079 Primary osteoarthritis, unspecified ankle and foot: Secondary | ICD-10-CM | POA: Diagnosis not present

## 2020-04-11 LAB — URIC ACID: Uric Acid, Serum: 6.8 mg/dL (ref 4.0–7.8)

## 2020-04-11 LAB — BASIC METABOLIC PANEL
BUN: 15 mg/dL (ref 6–23)
CO2: 31 mEq/L (ref 19–32)
Calcium: 9.7 mg/dL (ref 8.4–10.5)
Chloride: 102 mEq/L (ref 96–112)
Creatinine, Ser: 1.12 mg/dL (ref 0.40–1.50)
GFR: 73.56 mL/min (ref >60.00)
Glucose, Bld: 79 mg/dL (ref 70–99)
Potassium: 3.4 mEq/L — ABNORMAL LOW (ref 3.5–5.1)
Sodium: 139 mEq/L (ref 135–145)

## 2020-04-11 LAB — CBC WITH DIFFERENTIAL/PLATELET
Basophils Absolute: 0 10*3/uL (ref 0.0–0.1)
Basophils Relative: 0.4 % (ref 0.0–3.0)
Eosinophils Absolute: 0 10*3/uL (ref 0.0–0.7)
Eosinophils Relative: 0.9 % (ref 0.0–5.0)
HCT: 41.7 % (ref 39.0–52.0)
Hemoglobin: 13.7 g/dL (ref 13.0–17.0)
Lymphocytes Relative: 39.5 % (ref 12.0–46.0)
Lymphs Abs: 1.1 10*3/uL (ref 0.7–4.0)
MCHC: 32.9 g/dL (ref 30.0–36.0)
MCV: 78.7 fl (ref 78.0–100.0)
Monocytes Absolute: 0.4 10*3/uL (ref 0.1–1.0)
Monocytes Relative: 13.4 % — ABNORMAL HIGH (ref 3.0–12.0)
Neutro Abs: 1.3 10*3/uL — ABNORMAL LOW (ref 1.4–7.7)
Neutrophils Relative %: 45.8 % (ref 43.0–77.0)
Platelets: 153 10*3/uL (ref 150.0–400.0)
RBC: 5.31 Mil/uL (ref 4.22–5.81)
RDW: 14.3 % (ref 11.5–15.5)
WBC: 2.7 10*3/uL — ABNORMAL LOW (ref 4.0–10.5)

## 2020-04-11 LAB — C-REACTIVE PROTEIN: CRP: <1 mg/dL (ref 0.5–20.0)

## 2020-04-11 LAB — SEDIMENTATION RATE: Sed Rate: 18 mm/hr (ref 0–20)

## 2020-04-11 NOTE — Assessment & Plan Note (Signed)
Mild overall and could be secondary to gout.  Uric acid labs given today.  Patient keeps making significant progress when this does happen.  Discussed the tart cherry extract.  If continuing to have trouble we will need to add allopurinol.  Follow-up with me again more on an as-needed basis as long as patient does relatively well

## 2020-04-11 NOTE — Patient Instructions (Addendum)
Ok to start pushing it but everything including diet in moderation Shoulders looking pretty good increase slowly Labs today See me again when you need me

## 2020-04-15 LAB — PTH, INTACT AND CALCIUM
Calcium: 9.9 mg/dL (ref 8.6–10.3)
PTH: 28 pg/mL (ref 14–64)

## 2020-04-23 ENCOUNTER — Other Ambulatory Visit: Payer: Self-pay

## 2020-04-23 ENCOUNTER — Ambulatory Visit: Payer: BC Managed Care – PPO | Admitting: Family

## 2020-04-23 ENCOUNTER — Encounter: Payer: Self-pay | Admitting: Family

## 2020-04-23 VITALS — BP 146/80 | HR 58 | Temp 98.0°F | Ht 69.0 in | Wt 218.4 lb

## 2020-04-23 DIAGNOSIS — H6123 Impacted cerumen, bilateral: Secondary | ICD-10-CM

## 2020-04-23 DIAGNOSIS — H6093 Unspecified otitis externa, bilateral: Secondary | ICD-10-CM | POA: Diagnosis not present

## 2020-04-23 MED ORDER — NEOMYCIN-POLYMYXIN-HC 1 % OT SOLN: 3.0000 [drp] | Freq: Four times a day (QID) | OTIC | 0 refills | Status: DC

## 2020-04-23 NOTE — Progress Notes (Signed)
Willie Foster is a 54 y.o. male with the following history as recorded in Epic[facility]:  Patient Active Problem List   Diagnosis Date Noted  . 1st MTP arthritis 03/27/2020  . Bradycardia 03/21/2020  . Recurrent cold sores 03/21/2020  . Lumbar radiculopathy 01/10/2019  . GERD with esophagitis 06/30/2018  . Hemorrhoids, internal 08/18/2017  . Seasonal allergic rhinitis due to pollen 01/12/2017  . Elevated serum creatinine 06/15/2016  . Drug-induced erectile dysfunction 02/04/2016  . Routine general medical examination at a health care facility 01/31/2014  . NASH (nonalcoholic steatohepatitis) 01/31/2014  . SI (sacroiliac) joint dysfunction 08/15/2013  . Hyperglycemia 01/11/2013  . Obesity (BMI 30-39.9) 12/07/2012  . Pulmonary embolism ([place]) 11/13/2012  . Diuretic-induced hypokalemia 09/16/2012  . BPH (benign prostatic hyperplasia) 09/15/2012  . Essential hypertension, benign 07/20/2012  . Hyperlipidemia with target LDL less than 130 07/20/2012  . Herpes labialis 01/06/2011    Current Outpatient Medications  Medication Sig Dispense Refill  . acyclovir (ZOVIRAX) 400 MG tablet TAKE 1 TABLET BY MOUTH THREE TIMES DAILY AS NEEDED FOR  FEVER  BLISTERS 30 tablet 2  . chlorthalidone (HYGROTON) 25 MG tablet Take 1 tablet (25 mg total) by mouth daily. 90 tablet 1  . EQ ALLERGY RELIEF, CETIRIZINE, 10 MG tablet Take 1 tablet by mouth once daily 90 tablet 1  . gabapentin (NEURONTIN) 100 MG capsule TAKE 2 CAPSULES BY MOUTH AT BEDTIME 60 capsule 0  . gabapentin (NEURONTIN) 300 MG capsule Take 1 capsule (300 mg total) by mouth 3 (three) times daily. 90 capsule 3  . LIVALO 4 MG TABS Take 1 tablet by mouth once daily 90 tablet 0  . nitroGLYCERIN (NITRO-DUR) 0.1 mg/hr patch 1/4 patch daily 30 patch 12  . pioglitazone (ACTOS) 15 MG tablet Take 1 tablet by mouth once daily 90 tablet 1  . potassium chloride SA (KLOR-CON) 20 MEQ tablet Take 1 tablet by mouth once daily 90 tablet 1  . predniSONE (DELTASONE)  50 MG tablet Take one tablet daily for the next 5 days. 5 tablet 0  . sildenafil (VIAGRA) 100 MG tablet Take 1 tablet (100 mg total) by mouth daily as needed for erectile dysfunction. 10 tablet 11  . tiZANidine (ZANAFLEX) 4 MG tablet TAKE 1 TABLET BY MOUTH EVERY 6 HOURS AS NEEDED FOR MUSCLE SPASM 30 tablet 0  . Turmeric 500 MG CAPS Take 1 tablet by mouth 2 (two) times daily.    Alveda Reasons 10 MG TABS tablet Take 1 tablet by mouth once daily 90 tablet 1  . NEOMYCIN-POLYMYXIN-HYDROCORTISONE (CORTISPORIN) 1 % SOLN OTIC solution Place 3 drops into both ears every 6 (six) hours. 10 mL 0   No current facility-administered medications for this visit.    Allergies: Lipitor [atorvastatin]  Past Medical History:  Diagnosis Date  . Elevated serum creatinine 06/15/2016   1.66 06/02/16  . Fatty liver disease, nonalcoholic 2025  . Hyperlipidemia   . Hypertension   . Leukopenia 01/18/2013   WBC 3,400 01/18/13 was normal 8/22 Xarelto?  . Pulmonary embolism ([place]) 10/2012   Right lung    History reviewed. No pertinent surgical history.  Family History  Problem Relation Age of Onset  . Hypertension Father   . Alcohol abuse Neg Hx   . COPD Neg Hx   . Diabetes Neg Hx   . Drug abuse Neg Hx   . Early death Neg Hx   . Heart disease Neg Hx   . Hyperlipidemia Neg Hx   . Kidney disease Neg Hx   .  Stroke Neg Hx   . Lung cancer Father   . Breast cancer Mother        mets to brain    Social History   Tobacco Use  . Smoking status: Never Smoker  . Smokeless tobacco: Never Used  Substance Use Topics  . Alcohol use: Yes    Alcohol/week: 0.0 standard drinks    Comment: occasional-twice a week    Subjective:  Requesting ear lavage today; ears "feel full"; concerned that could have ear infection;   Objective:  Vitals:   04/23/20 0945  BP: (!) 146/80  Pulse: (!) 58  Temp: 98 F (36.7 C)  TempSrc: Oral  SpO2: 98%  Weight: 218 lb 6.4 oz (99.1 kg)  Height: 5' 9"  (1.753 m)    General: Well developed,  well nourished, in no acute distress  Skin : Warm and dry.  Head: Normocephalic and atraumatic  Eyes: Sclera and conjunctiva clear; pupils round and reactive to light; extraocular movements intact  Ears: External normal; canals clear; tympanic membranes normal  Lungs: Respirations unlabored;  Neurologic: Alert and oriented; speech intact; face symmetrical; moves all extremities well; CNII-XII intact without focal deficit   Assessment:  1. Bilateral impacted cerumen   2. Otitis externa of both ears, unspecified chronicity, unspecified type     Plan:  Ear lavage completed with no difficulty; Rx for Cortisporin Otic drops given to use as directed; follow-up worse, no better.  This visit occurred during the SARS-CoV-2 public health emergency.  Safety protocols were in place, including screening questions prior to the visit, additional usage of staff PPE, and extensive cleaning of exam room while observing appropriate contact time as indicated for disinfecting solutions.     No follow-ups on file.  No orders of the defined types were placed in this encounter.   Requested Prescriptions   Signed Prescriptions Disp Refills  . NEOMYCIN-POLYMYXIN-HYDROCORTISONE (CORTISPORIN) 1 % SOLN OTIC solution 10 mL 0    Sig: Place 3 drops into both ears every 6 (six) hours.

## 2020-04-26 NOTE — Progress Notes (Deleted)
   I, Peterson Lombard, LAT, ATC acting as a scribe for Lynne Leader, MD.  Nizar Cutler is a 54 y.o. male who presents to [facility] today for R foot pain. Pt was last seen by Dr. Tamala Julian on 04/11/20 and labs were completed. Today, pt reports  Labs: 04/11/20 Uric acid 6.48m/dL  Pertinent review of systems: ***  Relevant historical information: ***   Exam:  There were no vitals taken for this visit. General: Well Developed, well nourished, and in no acute distress.   MSK: ***    Lab and Radiology Results No results found for this or any previous visit (from the past 72 hour(s)). No results found.     Assessment and Plan: 54y.o. male with ***   PDMP not reviewed this encounter. No orders of the defined types were placed in this encounter.  No orders of the defined types were placed in this encounter.    Discussed warning signs or symptoms. Please see discharge instructions. Patient expresses understanding.   ***

## 2020-04-29 ENCOUNTER — Ambulatory Visit: Payer: BLUE CROSS/BLUE SHIELD | Admitting: Podiatry

## 2020-04-29 ENCOUNTER — Ambulatory Visit (INDEPENDENT_AMBULATORY_CARE_PROVIDER_SITE_OTHER): Payer: BLUE CROSS/BLUE SHIELD

## 2020-04-29 ENCOUNTER — Other Ambulatory Visit: Payer: Self-pay

## 2020-04-29 ENCOUNTER — Ambulatory Visit: Payer: BC Managed Care – PPO | Admitting: Family Medicine

## 2020-04-29 ENCOUNTER — Other Ambulatory Visit: Payer: Self-pay | Admitting: Podiatry

## 2020-04-29 DIAGNOSIS — M779 Enthesopathy, unspecified: Secondary | ICD-10-CM

## 2020-04-29 DIAGNOSIS — M778 Other enthesopathies, not elsewhere classified: Secondary | ICD-10-CM

## 2020-04-29 DIAGNOSIS — M1A9XX0 Chronic gout, unspecified, without tophus (tophi): Secondary | ICD-10-CM | POA: Diagnosis not present

## 2020-04-29 NOTE — Patient Instructions (Signed)
Gout  Gout is painful swelling of your joints. Gout is a type of arthritis. It is caused by having too much uric acid in your body. Uric acid is a chemical that is made when your body breaks down substances called purines. If your body has too much uric acid, sharp crystals can form and build up in your joints. This causes pain and swelling. Gout attacks can happen quickly and be very painful (acute gout). Over time, the attacks can affect more joints and happen more often (chronic gout). What are the causes?  Too much uric acid in your blood. This can happen because: ? Your kidneys do not remove enough uric acid from your blood. ? Your body makes too much uric acid. ? You eat too many foods that are high in purines. These foods include organ meats, some seafood, and beer.  Trauma or stress. What increases the risk?  Having a family history of gout.  Being male and middle-aged.  Being male and having gone through menopause.  Being very overweight (obese).  Drinking alcohol, especially beer.  Not having enough water in the body (being dehydrated).  Losing weight too quickly.  Having an organ transplant.  Having lead poisoning.  Taking certain medicines.  Having kidney disease.  Having a skin condition called psoriasis. What are the signs or symptoms? An attack of acute gout usually happens in just one joint. The most common place is the big toe. Attacks often start at night. Other joints that may be affected include joints of the feet, ankle, knee, fingers, wrist, or elbow. Symptoms of an attack may include:  Very bad pain.  Warmth.  Swelling.  Stiffness.  Shiny, red, or purple skin.  Tenderness. The affected joint may be very painful to touch.  Chills and fever. Chronic gout may cause symptoms more often. More joints may be involved. You may also have white or yellow lumps (tophi) on your hands or feet or in other areas near your joints. How is this  treated?  Treatment for this condition has two phases: treating an acute attack and preventing future attacks.  Acute gout treatment may include: ? NSAIDs. ? Steroids. These are taken by mouth or injected into a joint. ? Colchicine. This medicine relieves pain and swelling. It can be given by mouth or through an IV tube.  Preventive treatment may include: ? Taking small doses of NSAIDs or colchicine daily. ? Using a medicine that reduces uric acid levels in your blood. ? Making changes to your diet. You may need to see a food expert (dietitian) about what to eat and drink to prevent gout. Follow these instructions at home: During a gout attack   If told, put ice on the painful area: ? Put ice in a plastic bag. ? Place a towel between your skin and the bag. ? Leave the ice on for 20 minutes, 2-3 times a day.  Raise (elevate) the painful joint above the level of your heart as often as you can.  Rest the joint as much as possible. If the joint is in your leg, you may be given crutches.  Follow instructions from your doctor about what you cannot eat or drink. Avoiding future gout attacks  Eat a low-purine diet. Avoid foods and drinks such as: ? Liver. ? Kidney. ? Anchovies. ? Asparagus. ? Herring. ? Mushrooms. ? Mussels. ? Beer.  Stay at a healthy weight. If you want to lose weight, talk with your doctor. Do not lose weight  too fast.  Start or continue an exercise plan as told by your doctor. Eating and drinking  Drink enough fluids to keep your pee (urine) pale yellow.  If you drink alcohol: ? Limit how much you use to:  0-1 drink a day for women.  0-2 drinks a day for men. ? Be aware of how much alcohol is in your drink. In the [place] one drink equals one 12 oz bottle of beer (355 mL), one 5 oz glass of wine (148 mL), or one 1 oz glass of hard liquor (44 mL). General instructions  Take over-the-counter and prescription medicines only as told by your doctor.  Do  not drive or use heavy machinery while taking prescription pain medicine.  Return to your normal activities as told by your doctor. Ask your doctor what activities are safe for you.  Keep all follow-up visits as told by your doctor. This is important. Contact a doctor if:  You have another gout attack.  You still have symptoms of a gout attack after 10 days of treatment.  You have problems (side effects) because of your medicines.  You have chills or a fever.  You have burning pain when you pee (urinate).  You have pain in your lower back or belly. Get help right away if:  You have very bad pain.  Your pain cannot be controlled.  You cannot pee. Summary  Gout is painful swelling of the joints.  The most common site of pain is the big toe, but it can affect other joints.  Medicines and avoiding some foods can help to prevent and treat gout attacks. This information is not intended to replace advice given to you by your health care provider. Make sure you discuss any questions you have with your health care provider. Document Revised: 12/01/2017 Document Reviewed: 12/01/2017 Elsevier Patient Education  [place].

## 2020-04-30 LAB — COMPLETE METABOLIC PANEL WITH GFR
AG Ratio: 1.8 (calc) (ref 1.0–2.5)
ALT: 34 U/L (ref 9–46)
AST: 30 U/L (ref 10–35)
Albumin: 4.3 g/dL (ref 3.6–5.1)
Alkaline phosphatase (APISO): 65 U/L (ref 35–144)
BUN: 14 mg/dL (ref 7–25)
CO2: 29 mmol/L (ref 20–32)
Calcium: 9.6 mg/dL (ref 8.6–10.3)
Chloride: 104 mmol/L (ref 98–110)
Creat: 1.17 mg/dL (ref 0.70–1.33)
GFR, Est African American: 80 mL/min/{1.73_m2} (ref >=60)
GFR, Est Non African American: 69 mL/min/{1.73_m2} (ref >=60)
Globulin: 2.4 g/dL (calc) (ref 1.9–3.7)
Glucose, Bld: 89 mg/dL (ref 65–139)
Potassium: 3.6 mmol/L (ref 3.5–5.3)
Sodium: 140 mmol/L (ref 135–146)
Total Bilirubin: 0.4 mg/dL (ref 0.2–1.2)
Total Protein: 6.7 g/dL (ref 6.1–8.1)

## 2020-04-30 LAB — URIC ACID: Uric Acid, Serum: 6.4 mg/dL (ref 4.0–8.0)

## 2020-05-03 NOTE — Progress Notes (Signed)
Subjective:   Patient ID: Willie Foster, male   DOB: 54 y.o.   MRN: [id number]   HPI 54 year old male presents the office today for concerns of pain to his big toe joint.  He states that he has been following up with sports medicine for this.  They were concerned about possible fracture and later on gout.  He states he was doing better but then on Saturday he was having discomfort and he returned to the cam boot.  Prior to that he states he did have seafood the day before.  It does seem to after discussion that his symptoms started after he eats shellfish.   Review of Systems  All other systems reviewed and are negative.  Past Medical History:  Diagnosis Date  . Elevated serum creatinine 06/15/2016   1.66 06/02/16  . Fatty liver disease, nonalcoholic 4765  . Hyperlipidemia   . Hypertension   . Leukopenia 01/18/2013   WBC 3,400 01/18/13 was normal 8/22 Xarelto?  . Pulmonary embolism ([place]) 10/2012   Right lung    No past surgical history on file.   Current Outpatient Medications:  .  acyclovir (ZOVIRAX) 400 MG tablet, TAKE 1 TABLET BY MOUTH THREE TIMES DAILY AS NEEDED FOR  FEVER  BLISTERS, Disp: 30 tablet, Rfl: 2 .  chlorthalidone (HYGROTON) 25 MG tablet, Take 1 tablet (25 mg total) by mouth daily., Disp: 90 tablet, Rfl: 1 .  EQ ALLERGY RELIEF, CETIRIZINE, 10 MG tablet, Take 1 tablet by mouth once daily, Disp: 90 tablet, Rfl: 1 .  gabapentin (NEURONTIN) 100 MG capsule, TAKE 2 CAPSULES BY MOUTH AT BEDTIME, Disp: 60 capsule, Rfl: 0 .  gabapentin (NEURONTIN) 300 MG capsule, Take 1 capsule (300 mg total) by mouth 3 (three) times daily., Disp: 90 capsule, Rfl: 3 .  LIVALO 4 MG TABS, Take 1 tablet by mouth once daily, Disp: 90 tablet, Rfl: 0 .  NEOMYCIN-POLYMYXIN-HYDROCORTISONE (CORTISPORIN) 1 % SOLN OTIC solution, Place 3 drops into both ears every 6 (six) hours., Disp: 10 mL, Rfl: 0 .  nitroGLYCERIN (NITRO-DUR) 0.1 mg/hr patch, 1/4 patch daily, Disp: 30 patch, Rfl: 12 .  pioglitazone (ACTOS)  15 MG tablet, Take 1 tablet by mouth once daily, Disp: 90 tablet, Rfl: 1 .  potassium chloride SA (KLOR-CON) 20 MEQ tablet, Take 1 tablet by mouth once daily, Disp: 90 tablet, Rfl: 1 .  predniSONE (DELTASONE) 50 MG tablet, Take one tablet daily for the next 5 days., Disp: 5 tablet, Rfl: 0 .  sildenafil (VIAGRA) 100 MG tablet, Take 1 tablet (100 mg total) by mouth daily as needed for erectile dysfunction., Disp: 10 tablet, Rfl: 11 .  tiZANidine (ZANAFLEX) 4 MG tablet, TAKE 1 TABLET BY MOUTH EVERY 6 HOURS AS NEEDED FOR MUSCLE SPASM, Disp: 30 tablet, Rfl: 0 .  Turmeric 500 MG CAPS, Take 1 tablet by mouth 2 (two) times daily., Disp: , Rfl:  .  XARELTO 10 MG TABS tablet, Take 1 tablet by mouth once daily, Disp: 90 tablet, Rfl: 1  Allergies  Allergen Reactions  . Lipitor [Atorvastatin]     Elevated LFT's         Objective:  Physical Exam  General: AAO x3, NAD  Dermatological: Skin is warm, dry and supple bilateral.  There are no open sores, no preulcerative lesions, no rash or signs of infection present.  Vascular: Dorsalis Pedis artery and Posterior Tibial artery pedal pulses are 2/4 bilateral with immedate capillary fill time.There is no pain with calf compression, swelling, warmth, erythema.  Neruologic: Grossly intact via light touch bilateral.   Musculoskeletal: There is mild tenderness palpation along the right first MPJ with mild decreased range of motion.  There is no significant erythema or warmth there is no there is discomfort.  Muscular strength 5/5 in all groups tested bilateral.  Gait: Unassisted, Nonantalgic.       Assessment:   Capsulitis MPJ right foot     Plan:  -Treatment options discussed including all alternatives, risks, and complications -Etiology of symptoms were discussed -X-rays were obtained and reviewed with the patient.  Mild arthritic changes present the first MPJ.  There is no evidence of acute fracture. -We will recheck a uric acid  level. -Graphite insert -Discussed diet modifications, hydration. -We will hold off any prescription medications for now until we get a uric acid level.  His symptoms have improved the last couple of days.  Trula Slade DPM

## 2020-05-14 ENCOUNTER — Ambulatory Visit: Payer: BC Managed Care – PPO | Admitting: Family Medicine

## 2020-05-20 ENCOUNTER — Other Ambulatory Visit: Payer: Self-pay | Admitting: Internal Medicine

## 2020-05-20 DIAGNOSIS — T502X5A Adverse effect of carbonic-anhydrase inhibitors, benzothiadiazides and other diuretics, initial encounter: Secondary | ICD-10-CM

## 2020-05-20 DIAGNOSIS — E876 Hypokalemia: Secondary | ICD-10-CM

## 2020-05-20 DIAGNOSIS — I10 Essential (primary) hypertension: Secondary | ICD-10-CM

## 2020-06-08 ENCOUNTER — Other Ambulatory Visit: Payer: Self-pay | Admitting: Family Medicine

## 2020-06-20 ENCOUNTER — Other Ambulatory Visit: Payer: Self-pay

## 2020-06-21 ENCOUNTER — Ambulatory Visit (INDEPENDENT_AMBULATORY_CARE_PROVIDER_SITE_OTHER): Payer: BC Managed Care – PPO

## 2020-06-21 DIAGNOSIS — Z23 Encounter for immunization: Secondary | ICD-10-CM

## 2020-07-09 ENCOUNTER — Other Ambulatory Visit: Payer: Self-pay

## 2020-07-09 ENCOUNTER — Other Ambulatory Visit: Payer: Self-pay | Admitting: Internal Medicine

## 2020-07-09 ENCOUNTER — Ambulatory Visit (INDEPENDENT_AMBULATORY_CARE_PROVIDER_SITE_OTHER): Payer: BC Managed Care – PPO | Admitting: Podiatry

## 2020-07-09 ENCOUNTER — Other Ambulatory Visit: Payer: Self-pay | Admitting: Family Medicine

## 2020-07-09 DIAGNOSIS — M779 Enthesopathy, unspecified: Secondary | ICD-10-CM | POA: Diagnosis not present

## 2020-07-09 DIAGNOSIS — M1A9XX0 Chronic gout, unspecified, without tophus (tophi): Secondary | ICD-10-CM

## 2020-07-09 DIAGNOSIS — E785 Hyperlipidemia, unspecified: Secondary | ICD-10-CM

## 2020-07-09 NOTE — Patient Instructions (Signed)
Gout  Gout is painful swelling of your joints. Gout is a type of arthritis. It is caused by having too much uric acid in your body. Uric acid is a chemical that is made when your body breaks down substances called purines. If your body has too much uric acid, sharp crystals can form and build up in your joints. This causes pain and swelling. Gout attacks can happen quickly and be very painful (acute gout). Over time, the attacks can affect more joints and happen more often (chronic gout). What are the causes?  Too much uric acid in your blood. This can happen because: ? Your kidneys do not remove enough uric acid from your blood. ? Your body makes too much uric acid. ? You eat too many foods that are high in purines. These foods include organ meats, some seafood, and beer.  Trauma or stress. What increases the risk?  Having a family history of gout.  Being male and middle-aged.  Being male and having gone through menopause.  Being very overweight (obese).  Drinking alcohol, especially beer.  Not having enough water in the body (being dehydrated).  Losing weight too quickly.  Having an organ transplant.  Having lead poisoning.  Taking certain medicines.  Having kidney disease.  Having a skin condition called psoriasis. What are the signs or symptoms? An attack of acute gout usually happens in just one joint. The most common place is the big toe. Attacks often start at night. Other joints that may be affected include joints of the feet, ankle, knee, fingers, wrist, or elbow. Symptoms of an attack may include:  Very bad pain.  Warmth.  Swelling.  Stiffness.  Shiny, red, or purple skin.  Tenderness. The affected joint may be very painful to touch.  Chills and fever. Chronic gout may cause symptoms more often. More joints may be involved. You may also have white or yellow lumps (tophi) on your hands or feet or in other areas near your joints.   How is this  treated?  Treatment for this condition has two phases: treating an acute attack and preventing future attacks.  Acute gout treatment may include: ? NSAIDs. ? Steroids. These are taken by mouth or injected into a joint. ? Colchicine. This medicine relieves pain and swelling. It can be given by mouth or through an IV tube.  Preventive treatment may include: ? Taking small doses of NSAIDs or colchicine daily. ? Using a medicine that reduces uric acid levels in your blood. ? Making changes to your diet. You may need to see a food expert (dietitian) about what to eat and drink to prevent gout. Follow these instructions at home: During a gout attack  If told, put ice on the painful area: ? Put ice in a plastic bag. ? Place a towel between your skin and the bag. ? Leave the ice on for 20 minutes, 2-3 times a day.  Raise (elevate) the painful joint above the level of your heart as often as you can.  Rest the joint as much as possible. If the joint is in your leg, you may be given crutches.  Follow instructions from your doctor about what you cannot eat or drink.   Avoiding future gout attacks  Eat a low-purine diet. Avoid foods and drinks such as: ? Liver. ? Kidney. ? Anchovies. ? Asparagus. ? Herring. ? Mushrooms. ? Mussels. ? Beer.  Stay at a healthy weight. If you want to lose weight, talk with your doctor. Do   not lose weight too fast.  Start or continue an exercise plan as told by your doctor. Eating and drinking  Drink enough fluids to keep your pee (urine) pale yellow.  If you drink alcohol: ? Limit how much you use to:  0-1 drink a day for women.  0-2 drinks a day for men. ? Be aware of how much alcohol is in your drink. In the [place] one drink equals one 12 oz bottle of beer (355 mL), one 5 oz glass of wine (148 mL), or one 1 oz glass of hard liquor (44 mL). General instructions  Take over-the-counter and prescription medicines only as told by your doctor.  Do  not drive or use heavy machinery while taking prescription pain medicine.  Return to your normal activities as told by your doctor. Ask your doctor what activities are safe for you.  Keep all follow-up visits as told by your doctor. This is important. Contact a doctor if:  You have another gout attack.  You still have symptoms of a gout attack after 10 days of treatment.  You have problems (side effects) because of your medicines.  You have chills or a fever.  You have burning pain when you pee (urinate).  You have pain in your lower back or belly. Get help right away if:  You have very bad pain.  Your pain cannot be controlled.  You cannot pee. Summary  Gout is painful swelling of the joints.  The most common site of pain is the big toe, but it can affect other joints.  Medicines and avoiding some foods can help to prevent and treat gout attacks. This information is not intended to replace advice given to you by your health care provider. Make sure you discuss any questions you have with your health care provider. Document Revised: 12/01/2017 Document Reviewed: 12/01/2017 Elsevier Patient Education  2021 [employer]  

## 2020-07-10 DIAGNOSIS — H2512 Age-related nuclear cataract, left eye: Secondary | ICD-10-CM | POA: Diagnosis not present

## 2020-07-10 DIAGNOSIS — I10 Essential (primary) hypertension: Secondary | ICD-10-CM | POA: Diagnosis not present

## 2020-07-10 DIAGNOSIS — H2513 Age-related nuclear cataract, bilateral: Secondary | ICD-10-CM | POA: Diagnosis not present

## 2020-07-10 DIAGNOSIS — H25043 Posterior subcapsular polar age-related cataract, bilateral: Secondary | ICD-10-CM | POA: Diagnosis not present

## 2020-07-10 DIAGNOSIS — H25013 Cortical age-related cataract, bilateral: Secondary | ICD-10-CM | POA: Diagnosis not present

## 2020-07-11 ENCOUNTER — Telehealth: Payer: Self-pay

## 2020-07-11 NOTE — Telephone Encounter (Signed)
Key: [id number]

## 2020-07-13 NOTE — Progress Notes (Signed)
Subjective: 55 year old male presents the office today for follow evaluation of right foot discomfort.  He states that he has been doing pretty well and not seeing any recent gout flareups.  He feels that at times his foot will lock up.  He does wear the graphite insert all the time.  He said no recent or falls or changes otherwise. Denies any systemic complaints such as fevers, chills, nausea, vomiting. No acute changes since last appointment, and no other complaints at this time.   Objective: AAO x3, NAD DP/PT pulses palpable bilaterally, CRT less than 3 seconds There is no area of pinpoint tenderness identified on today's exam.  There is mild decreased range of motion of first MPJ without any crepitation or pain today.  No significant edema, erythema.  MMT 5/5. No pain with calf compression, swelling, warmth, erythema  Assessment: 55 year old male with first MPJ hallux limitus; history of gout  Plan: -All treatment options discussed with the patient including all alternatives, risks, complications.  -He is doing well for gout flare he has modified his diet.  Discussed hydration.  Discussed different exercises to help with his foot and toes to help with the plugged feeling locked up.  Discussed holding off on the graphite insert except for when he is doing [employer] as I do think the activity does flareup the first MPJ as well.  Discussed also that he could try stiffer soled shoes.  Those activities. -Patient encouraged to call the office with any questions, concerns, change in symptoms.   Trula Slade DPM

## 2020-07-15 NOTE — Telephone Encounter (Signed)
PA was denied  Determination sent to scan

## 2020-07-17 ENCOUNTER — Other Ambulatory Visit: Payer: Self-pay | Admitting: Internal Medicine

## 2020-07-17 DIAGNOSIS — E785 Hyperlipidemia, unspecified: Secondary | ICD-10-CM

## 2020-07-17 MED ORDER — ROSUVASTATIN CALCIUM 10 MG PO TABS: 10.0000 mg | ORAL_TABLET | Freq: Every day | ORAL | 1 refills | Status: DC

## 2020-07-17 NOTE — Telephone Encounter (Signed)
Patient wondering if a different medication needs to be sent in or what needs to be done

## 2020-08-13 DIAGNOSIS — H2512 Age-related nuclear cataract, left eye: Secondary | ICD-10-CM | POA: Diagnosis not present

## 2020-08-20 DIAGNOSIS — H2511 Age-related nuclear cataract, right eye: Secondary | ICD-10-CM | POA: Diagnosis not present

## 2020-08-26 ENCOUNTER — Other Ambulatory Visit: Payer: Self-pay | Admitting: Internal Medicine

## 2020-08-26 DIAGNOSIS — I2782 Chronic pulmonary embolism: Secondary | ICD-10-CM

## 2020-09-19 ENCOUNTER — Other Ambulatory Visit: Payer: Self-pay

## 2020-09-19 ENCOUNTER — Encounter: Payer: Self-pay | Admitting: Internal Medicine

## 2020-09-19 ENCOUNTER — Ambulatory Visit: Payer: BC Managed Care – PPO | Admitting: Internal Medicine

## 2020-09-19 VITALS — BP 168/102 | HR 60 | Temp 98.2°F | Resp 16 | Ht 69.0 in | Wt 218.0 lb

## 2020-09-19 DIAGNOSIS — I10 Essential (primary) hypertension: Secondary | ICD-10-CM

## 2020-09-19 DIAGNOSIS — K7581 Nonalcoholic steatohepatitis (NASH): Secondary | ICD-10-CM

## 2020-09-19 LAB — BASIC METABOLIC PANEL
BUN: 19 mg/dL (ref 6–23)
CO2: 30 mEq/L (ref 19–32)
Calcium: 9.9 mg/dL (ref 8.4–10.5)
Chloride: 102 mEq/L (ref 96–112)
Creatinine, Ser: 1.1 mg/dL (ref 0.40–1.50)
GFR: 74.93 mL/min (ref >60.00)
Glucose, Bld: 77 mg/dL (ref 70–99)
Potassium: 3.6 mEq/L (ref 3.5–5.1)
Sodium: 139 mEq/L (ref 135–145)

## 2020-09-19 LAB — HEPATIC FUNCTION PANEL
ALT: 46 U/L (ref 0–53)
AST: 34 U/L (ref 0–37)
Albumin: 4.5 g/dL (ref 3.5–5.2)
Alkaline Phosphatase: 60 U/L (ref 39–117)
Bilirubin, Direct: 0.1 mg/dL (ref 0.0–0.3)
Total Bilirubin: 0.4 mg/dL (ref 0.2–1.2)
Total Protein: 7.3 g/dL (ref 6.0–8.3)

## 2020-09-19 MED ORDER — OLMESARTAN MEDOXOMIL 20 MG PO TABS
20.0000 mg | ORAL_TABLET | Freq: Every day | ORAL | 0 refills | Status: DC
Start: 2020-09-19 — End: 2020-11-22

## 2020-09-19 NOTE — Progress Notes (Signed)
Subjective:  Patient ID: Willie Foster, male    DOB: 10/02/1965  Age: 55 y.o. MRN: [id number]  CC: Hypertension  This visit occurred during the SARS-CoV-2 public health emergency.  Safety protocols were in place, including screening questions prior to the visit, additional usage of staff PPE, and extensive cleaning of exam room while observing appropriate contact time as indicated for disinfecting solutions.    HPI Willie Foster presents for f/up -   One day prior to this visit he developed nasal congestion so he started taking multisymptom congestion reliever.  He says the nasal congestion got better.  He has not been monitoring his blood pressure.  He denies headache, blurred vision, chest pain, shortness of breath, diaphoresis, edema, dizziness, or lightheadedness.  Outpatient Medications Prior to Visit  Medication Sig Dispense Refill  . acyclovir (ZOVIRAX) 400 MG tablet TAKE 1 TABLET BY MOUTH THREE TIMES DAILY AS NEEDED FOR  FEVER  BLISTERS 30 tablet 2  . chlorthalidone (HYGROTON) 25 MG tablet Take 1 tablet by mouth once daily 90 tablet 1  . EQ ALLERGY RELIEF, CETIRIZINE, 10 MG tablet Take 1 tablet by mouth once daily 90 tablet 1  . gabapentin (NEURONTIN) 100 MG capsule TAKE 2 CAPSULES BY MOUTH AT BEDTIME 60 capsule 0  . gabapentin (NEURONTIN) 300 MG capsule Take 1 capsule (300 mg total) by mouth 3 (three) times daily. 90 capsule 3  . nitroGLYCERIN (NITRO-DUR) 0.1 mg/hr patch 1/4 patch daily 30 patch 12  . pioglitazone (ACTOS) 15 MG tablet Take 1 tablet by mouth once daily 90 tablet 1  . potassium chloride SA (KLOR-CON) 20 MEQ tablet Take 1 tablet by mouth once daily 90 tablet 1  . rosuvastatin (CRESTOR) 10 MG tablet Take 1 tablet (10 mg total) by mouth daily. 90 tablet 1  . sildenafil (VIAGRA) 100 MG tablet Take 1 tablet (100 mg total) by mouth daily as needed for erectile dysfunction. 10 tablet 11  . tiZANidine (ZANAFLEX) 4 MG tablet TAKE 1 TABLET BY MOUTH EVERY 6 HOURS AS NEEDED  FOR MUSCLE SPASM 30 tablet 0  . Turmeric 500 MG CAPS Take 1 tablet by mouth 2 (two) times daily.    Alveda Reasons 10 MG TABS tablet Take 1 tablet by mouth once daily 90 tablet 1   No facility-administered medications prior to visit.    ROS Review of Systems  Constitutional: Negative for chills, diaphoresis, fatigue and fever.  HENT: Positive for congestion. Negative for facial swelling, sinus pressure and sore throat.   Eyes: Negative.   Respiratory: Negative for cough, chest tightness, shortness of breath and wheezing.   Cardiovascular: Negative for chest pain, palpitations and leg swelling.  Gastrointestinal: Negative for abdominal pain, nausea and vomiting.  Endocrine: Negative.   Genitourinary: Negative.  Negative for difficulty urinating.  Musculoskeletal: Negative for arthralgias.  Skin: Negative for color change and rash.  Neurological: Negative.  Negative for dizziness, light-headedness and headaches.  Hematological: Negative for adenopathy. Does not bruise/bleed easily.  Psychiatric/Behavioral: Negative.     Objective:  BP (!) 168/102 (BP Location: Left Arm, Patient Position: Sitting, Cuff Size: Large)   Pulse 60   Temp 98.2 F (36.8 C) (Oral)   Resp 16   Ht 5' 9"  (1.753 m)   Wt 218 lb (98.9 kg)   SpO2 96%   BMI 32.19 kg/m   BP Readings from Last 3 Encounters:  09/19/20 (!) 168/102  04/23/20 (!) 146/80  04/11/20 110/76    Wt Readings from Last 3 Encounters:  09/19/20 218  lb (98.9 kg)  04/23/20 218 lb 6.4 oz (99.1 kg)  04/11/20 215 lb (97.5 kg)    Physical Exam Vitals reviewed.  Constitutional:      Appearance: Normal appearance.  HENT:     Nose: Nose normal.     Mouth/Throat:     Mouth: Mucous membranes are moist.  Eyes:     General: No scleral icterus.    Conjunctiva/sclera: Conjunctivae normal.  Cardiovascular:     Rate and Rhythm: Normal rate and regular rhythm.     Heart sounds: No murmur heard.   Pulmonary:     Effort: Pulmonary effort is  normal.     Breath sounds: No stridor. No wheezing, rhonchi or rales.  Abdominal:     General: Abdomen is flat. Bowel sounds are normal. There is no distension.     Palpations: Abdomen is soft. There is no hepatomegaly, splenomegaly or mass.  Musculoskeletal:        General: Normal range of motion.     Cervical back: Neck supple.     Right lower leg: No edema.     Left lower leg: No edema.  Lymphadenopathy:     Cervical: No cervical adenopathy.  Skin:    General: Skin is warm and dry.  Neurological:     General: No focal deficit present.     Mental Status: He is alert.     Lab Results  Component Value Date   WBC 2.7 (L) 04/11/2020   HGB 13.7 04/11/2020   HCT 41.7 04/11/2020   PLT 153.0 04/11/2020   GLUCOSE 77 09/19/2020   CHOL 189 03/21/2020   TRIG 107.0 03/21/2020   HDL 69.10 03/21/2020   LDLCALC 98 03/21/2020   ALT 46 09/19/2020   AST 34 09/19/2020   NA 139 09/19/2020   K 3.6 09/19/2020   CL 102 09/19/2020   CREATININE 1.10 09/19/2020   BUN 19 09/19/2020   CO2 30 09/19/2020   TSH 0.75 03/21/2020   PSA 2.25 03/21/2020   INR 1.6 (H) 03/21/2020   HGBA1C 5.6 12/19/2014    DG Lumbar Spine Complete  Result Date: 02/15/2019 CLINICAL DATA:  Lower back pain AND rt side leg pain x 4 wks, NKI. EXAM: LUMBAR SPINE - COMPLETE 4+ VIEW COMPARISON:  Lumbar spine radiographs 12/29/2013 FINDINGS: Five non-rib-bearing lumbar-type vertebral bodies. Normal spinal alignment. No evidence of new listhesis. Vertebral body heights are maintained without evidence of fracture. There is moderate disc space loss at L4-5 and L5-S1 with associated endplate sclerosis and minimal anterior spurring. Lower lumbar facet hypertrophy. Nonobstructive bowel gas pattern. The lung bases are clear. IMPRESSION: 1. No acute osseous abnormality. 2. Moderate degenerative disc disease at L4-5 and L5-S1. Electronically Signed   By: Willie Foster M.D.   On: 02/15/2019 13:36    Assessment & Plan:   Willie Foster was  seen today for hypertension.  Diagnoses and all orders for this visit:  Essential hypertension, benign- His blood pressure is not adequately well controlled.  I think he took some decongestants in the last 24 hours.  I have asked him to avoid decongestants.  Will add an ARB to the thiazide diuretic. -     Basic metabolic panel; Future -     olmesartan (BENICAR) 20 MG tablet; Take 1 tablet (20 mg total) by mouth daily. -     Basic metabolic panel  NASH (nonalcoholic steatohepatitis)- His liver enzymes are stable.  He will continue working on his lifestyle modifications. -     Hepatic function  panel; Future -     Hepatic function panel   I am having [name] start on olmesartan. I am also having him maintain his Turmeric, sildenafil, tiZANidine, gabapentin, nitroGLYCERIN, acyclovir, pioglitazone, potassium chloride SA, EQ Allergy Relief (Cetirizine), chlorthalidone, gabapentin, rosuvastatin, and Xarelto.  Meds ordered this encounter  Medications  . olmesartan (BENICAR) 20 MG tablet    Sig: Take 1 tablet (20 mg total) by mouth daily.    Dispense:  90 tablet    Refill:  0     Follow-up: Return in about 6 weeks (around 10/31/2020).  Scarlette Calico, MD

## 2020-09-19 NOTE — Patient Instructions (Signed)

## 2020-10-07 ENCOUNTER — Other Ambulatory Visit: Payer: Self-pay | Admitting: Internal Medicine

## 2020-10-07 DIAGNOSIS — E785 Hyperlipidemia, unspecified: Secondary | ICD-10-CM

## 2020-10-13 ENCOUNTER — Other Ambulatory Visit: Payer: Self-pay | Admitting: Internal Medicine

## 2020-10-13 DIAGNOSIS — E785 Hyperlipidemia, unspecified: Secondary | ICD-10-CM

## 2020-10-15 ENCOUNTER — Telehealth: Payer: Self-pay | Admitting: Internal Medicine

## 2020-10-15 NOTE — Telephone Encounter (Signed)
Patient called and said that his pharmacy told him that he picked a refill for rosuvastatin (CRESTOR) 10 MG tablet on 09-28-20. He said that he did not request a refill until 10-07-20. The pharmacy and his insurance have told him that he paid for the medication and picked it up. [employer] has told him that this will not be covered under his insurance and wont be able to have a refill until sometime in July. His pharmacy told him to call his insurance company to ask for an override. Please advise

## 2020-10-22 NOTE — Progress Notes (Signed)
[place] Phone: [phone] Subjective:   [name], am serving as a scribe for Dr. Hulan Saas. This visit occurred during the SARS-CoV-2 public health emergency.  Safety protocols were in place, including screening questions prior to the visit, additional usage of staff PPE, and extensive cleaning of exam room while observing appropriate contact time as indicated for disinfecting solutions.   I'm seeing this patient by the request  of:  Willie Lima, MD  CC: Left shoulder pain  WUX:[id number]   04/11/2020 Mild overall and could be secondary to gout.  Uric acid labs given today.  Patient keeps making significant progress when this does happen.  Discussed the tart cherry extract.  If continuing to have trouble we will need to add allopurinol.  Follow-up with me again more on an as-needed basis as long as patient does relatively well  Update 10/23/2020 Willie Foster is a 55 y.o. male coming in with complaint of L arm tingling in anterior aspect when he dove into pool this weekend. Has been doing CrossFit and has not had pain.  Patient describes the pain as a dull, throbbing aching pain.  Very minimal overall.  Patient is able to do daily activities relatively well.     Past Medical History:  Diagnosis Date  . Elevated serum creatinine 06/15/2016   1.66 06/02/16  . Fatty liver disease, nonalcoholic 5366  . Hyperlipidemia   . Hypertension   . Leukopenia 01/18/2013   WBC 3,400 01/18/13 was normal 8/22 Xarelto?  . Pulmonary embolism ([place]) 10/2012   Right lung   No past surgical history on file. Social History   Socioeconomic History  . Marital status: Single    Spouse name: Not on file  . Number of children: 0  . Years of education: Not on file  . Highest education level: Not on file  Occupational History  . Occupation: Manufacturing engineer: Engineer, manufacturing  Tobacco Use  . Smoking  status: Never Smoker  . Smokeless tobacco: Never Used  Substance and Sexual Activity  . Alcohol use: Yes    Alcohol/week: 0.0 standard drinks    Comment: occasional-twice a week  . Drug use: No  . Sexual activity: Not on file  Other Topics Concern  . Not on file  Social History Narrative  . Not on file   Social Determinants of Health   Financial Resource Strain: Not on file  Food Insecurity: Not on file  Transportation Needs: Not on file  Physical Activity: Not on file  Stress: Not on file  Social Connections: Not on file   Allergies  Allergen Reactions  . Lipitor [Atorvastatin]     Elevated LFT's   Family History  Problem Relation Age of Onset  . Hypertension Father   . Alcohol abuse Neg Hx   . COPD Neg Hx   . Diabetes Neg Hx   . Drug abuse Neg Hx   . Early death Neg Hx   . Heart disease Neg Hx   . Hyperlipidemia Neg Hx   . Kidney disease Neg Hx   . Stroke Neg Hx   . Lung cancer Father   . Breast cancer Mother        mets to brain    Current Outpatient Medications (Endocrine & Metabolic):  .  pioglitazone (ACTOS) 15 MG tablet, Take 1 tablet by mouth once daily  Current Outpatient Medications (Cardiovascular):  .  chlorthalidone (HYGROTON)  25 MG tablet, Take 1 tablet by mouth once daily .  nitroGLYCERIN (NITRO-DUR) 0.1 mg/hr patch, 1/4 patch daily .  olmesartan (BENICAR) 20 MG tablet, Take 1 tablet (20 mg total) by mouth daily. .  rosuvastatin (CRESTOR) 10 MG tablet, Take 1 tablet by mouth once daily .  sildenafil (VIAGRA) 100 MG tablet, Take 1 tablet (100 mg total) by mouth daily as needed for erectile dysfunction.  Current Outpatient Medications (Respiratory):  [name] ALLERGY RELIEF, CETIRIZINE, 10 MG tablet, Take 1 tablet by mouth once daily   Current Outpatient Medications (Hematological):  Marland Kitchen  XARELTO 10 MG TABS tablet, Take 1 tablet by mouth once daily  Current Outpatient Medications (Other):  .  acyclovir (ZOVIRAX) 400 MG tablet, TAKE 1 TABLET BY  MOUTH THREE TIMES DAILY AS NEEDED FOR  FEVER  BLISTERS .  gabapentin (NEURONTIN) 100 MG capsule, TAKE 2 CAPSULES BY MOUTH AT BEDTIME .  gabapentin (NEURONTIN) 300 MG capsule, Take 1 capsule (300 mg total) by mouth 3 (three) times daily. .  potassium chloride SA (KLOR-CON) 20 MEQ tablet, Take 1 tablet by mouth once daily .  tiZANidine (ZANAFLEX) 4 MG tablet, TAKE 1 TABLET BY MOUTH EVERY 6 HOURS AS NEEDED FOR MUSCLE SPASM .  Turmeric 500 MG CAPS, Take 1 tablet by mouth 2 (two) times daily. .  Vitamin D, Ergocalciferol, (DRISDOL) 1.25 MG (50000 UNIT) CAPS capsule, Take 1 capsule (50,000 Units total) by mouth every 7 (seven) days.   Reviewed prior external information including notes and imaging from  primary care provider As well as notes that were available from care everywhere and other healthcare systems.  Past medical history, social, surgical and family history all reviewed in electronic medical record.  No pertanent information unless stated regarding to the chief complaint.   Review of Systems:  No headache, visual changes, nausea, vomiting, diarrhea, constipation, dizziness, abdominal pain, skin rash, fevers, chills, night sweats, weight loss, swollen lymph nodes, body aches, joint swelling, chest pain, shortness of breath, mood changes.  Objective  Blood pressure 118/86, pulse 78, height 5' 9"  (1.753 m), weight 217 lb (98.4 kg), SpO2 96 %.   General: No apparent distress alert and oriented x3 mood and affect normal, dressed appropriately.  HEENT: Pupils equal, extraocular movements intact  Respiratory: Patient's speak in full sentences and does not appear short of breath  Cardiovascular: No lower extremity edema, non tender, no erythema  Gait normal with good balance and coordination.  MSK: Left shoulder exam shows the patient has very mild positive impingement with Neer and Hawkins.  Patient does have mild positive crossover noted as well.  Rotator cuff strength does appear to be  intact.  Very mild weakness of the infraspinatus with resisted external rotation compared to the contralateral side.  Negative musculoskeletal ultrasound was performed and interpreted by Lyndal Pulley Left shoulder exam shows the patient does have some calcific changes noted of the supraspinatus.  No true retraction noted.  Patient has very mild osteoarthritic changes of the acromioclavicular joint.  Otherwise fairly unremarkable ultrasound. Impression: Calcific changes of the supraspinatus noted.    Impression and Recommendations:     The above documentation has been reviewed and is accurate and complete Lyndal Pulley, DO

## 2020-10-23 ENCOUNTER — Ambulatory Visit (INDEPENDENT_AMBULATORY_CARE_PROVIDER_SITE_OTHER): Payer: BC Managed Care – PPO

## 2020-10-23 ENCOUNTER — Ambulatory Visit (INDEPENDENT_AMBULATORY_CARE_PROVIDER_SITE_OTHER): Payer: BC Managed Care – PPO | Admitting: Family Medicine

## 2020-10-23 ENCOUNTER — Other Ambulatory Visit: Payer: Self-pay

## 2020-10-23 ENCOUNTER — Encounter: Payer: Self-pay | Admitting: Family Medicine

## 2020-10-23 ENCOUNTER — Ambulatory Visit: Payer: Self-pay

## 2020-10-23 VITALS — BP 118/86 | HR 78 | Ht 69.0 in | Wt 217.0 lb

## 2020-10-23 DIAGNOSIS — G8929 Other chronic pain: Secondary | ICD-10-CM | POA: Diagnosis not present

## 2020-10-23 DIAGNOSIS — M25512 Pain in left shoulder: Secondary | ICD-10-CM

## 2020-10-23 DIAGNOSIS — M79671 Pain in right foot: Secondary | ICD-10-CM | POA: Diagnosis not present

## 2020-10-23 MED ORDER — VITAMIN D (ERGOCALCIFEROL) 1.25 MG (50000 UNIT) PO CAPS: 50000.0000 [IU] | ORAL_CAPSULE | ORAL | 0 refills | Status: DC

## 2020-10-23 NOTE — Assessment & Plan Note (Signed)
Patient does have some mild calcific changes noted in the supraspinatus tendon.  We will get x-rays to further evaluate.  Discussed with patient about icing regimen and home exercises.  Could potentially do the nitroglycerin if necessary again.  Patient is going to start doing exercises more regularly and icing regimen.  Worsening pain do feel advanced imaging would be warranted.  Patient is in agreement with the plan.

## 2020-10-23 NOTE — Patient Instructions (Signed)
Once weekly Vit D Ice after exercises Xray L shoulder See me again in 8 weeks can look at it again or consider MRI

## 2020-10-31 ENCOUNTER — Ambulatory Visit: Payer: BC Managed Care – PPO | Admitting: Internal Medicine

## 2020-10-31 ENCOUNTER — Encounter: Payer: Self-pay | Admitting: Internal Medicine

## 2020-10-31 ENCOUNTER — Other Ambulatory Visit: Payer: Self-pay

## 2020-10-31 VITALS — BP 124/78 | HR 78 | Temp 98.4°F | Resp 16 | Ht 69.0 in | Wt 214.0 lb

## 2020-10-31 DIAGNOSIS — I10 Essential (primary) hypertension: Secondary | ICD-10-CM

## 2020-10-31 NOTE — Progress Notes (Signed)
Subjective:  Patient ID: Willie Foster, male    DOB: 31-May-1965  Age: 55 y.o. MRN: [id number]  CC: Hypertension  This visit occurred during the SARS-CoV-2 public health emergency.  Safety protocols were in place, including screening questions prior to the visit, additional usage of staff PPE, and extensive cleaning of exam room while observing appropriate contact time as indicated for disinfecting solutions.    HPI Willie Foster presents for a BP check -  He recently had a spike in his blood pressure after taking a decongestant.  He is no longer taking a decongestant and tells me his blood pressure has been well controlled.  He is active and denies any recent episodes of chest pain, shortness of breath, diaphoresis, dizziness, or lightheadedness.  Outpatient Medications Prior to Visit  Medication Sig Dispense Refill   acyclovir (ZOVIRAX) 400 MG tablet TAKE 1 TABLET BY MOUTH THREE TIMES DAILY AS NEEDED FOR  FEVER  BLISTERS 30 tablet 2   chlorthalidone (HYGROTON) 25 MG tablet Take 1 tablet by mouth once daily 90 tablet 1   EQ ALLERGY RELIEF, CETIRIZINE, 10 MG tablet Take 1 tablet by mouth once daily 90 tablet 1   gabapentin (NEURONTIN) 100 MG capsule TAKE 2 CAPSULES BY MOUTH AT BEDTIME 60 capsule 0   gabapentin (NEURONTIN) 300 MG capsule Take 1 capsule (300 mg total) by mouth 3 (three) times daily. 90 capsule 3   nitroGLYCERIN (NITRO-DUR) 0.1 mg/hr patch 1/4 patch daily 30 patch 12   olmesartan (BENICAR) 20 MG tablet Take 1 tablet (20 mg total) by mouth daily. 90 tablet 0   pioglitazone (ACTOS) 15 MG tablet Take 1 tablet by mouth once daily 90 tablet 1   potassium chloride SA (KLOR-CON) 20 MEQ tablet Take 1 tablet by mouth once daily 90 tablet 1   rosuvastatin (CRESTOR) 10 MG tablet Take 1 tablet by mouth once daily 90 tablet 1   sildenafil (VIAGRA) 100 MG tablet Take 1 tablet (100 mg total) by mouth daily as needed for erectile dysfunction. 10 tablet 11   tiZANidine (ZANAFLEX) 4 MG tablet  TAKE 1 TABLET BY MOUTH EVERY 6 HOURS AS NEEDED FOR MUSCLE SPASM 30 tablet 0   Turmeric 500 MG CAPS Take 1 tablet by mouth 2 (two) times daily.     Vitamin D, Ergocalciferol, (DRISDOL) 1.25 MG (50000 UNIT) CAPS capsule Take 1 capsule (50,000 Units total) by mouth every 7 (seven) days. 12 capsule 0   XARELTO 10 MG TABS tablet Take 1 tablet by mouth once daily 90 tablet 1   No facility-administered medications prior to visit.    ROS Review of Systems  Constitutional:  Negative for diaphoresis and fatigue.  HENT: Negative.    Eyes: Negative.  Negative for visual disturbance.  Respiratory:  Negative for cough, chest tightness and shortness of breath.   Cardiovascular:  Negative for chest pain, palpitations and leg swelling.  Gastrointestinal:  Negative for abdominal pain.  Genitourinary: Negative.  Negative for difficulty urinating.  Musculoskeletal:  Negative for arthralgias and myalgias.  Skin: Negative.   Neurological:  Negative for dizziness, weakness, light-headedness and numbness.  Hematological: Negative.   Psychiatric/Behavioral: Negative.     Objective:  BP 124/78 (BP Location: Left Arm, Patient Position: Sitting, Cuff Size: Large)   Pulse 78   Temp 98.4 F (36.9 C) (Oral)   Resp 16   Ht 5' 9"  (1.753 m)   Wt 214 lb (97.1 kg)   SpO2 97%   BMI 31.60 kg/m   BP Readings from  Last 3 Encounters:  10/31/20 124/78  10/23/20 118/86  09/19/20 (!) 168/102    Wt Readings from Last 3 Encounters:  10/31/20 214 lb (97.1 kg)  10/23/20 217 lb (98.4 kg)  09/19/20 218 lb (98.9 kg)    Physical Exam Vitals reviewed.  HENT:     Nose: Nose normal.     Mouth/Throat:     Mouth: Mucous membranes are moist.  Eyes:     Conjunctiva/sclera: Conjunctivae normal.  Cardiovascular:     Rate and Rhythm: Normal rate and regular rhythm.     Heart sounds: No murmur heard. Pulmonary:     Effort: Pulmonary effort is normal.     Breath sounds: Normal breath sounds.  Abdominal:     General:  Abdomen is flat.     Palpations: There is no mass.     Tenderness: There is no abdominal tenderness.  Musculoskeletal:        General: Normal range of motion.     Cervical back: Neck supple.     Right lower leg: No edema.     Left lower leg: No edema.  Lymphadenopathy:     Cervical: No cervical adenopathy.  Skin:    General: Skin is warm.  Neurological:     General: No focal deficit present.     Mental Status: He is alert.    Lab Results  Component Value Date   WBC 2.7 (L) 04/11/2020   HGB 13.7 04/11/2020   HCT 41.7 04/11/2020   PLT 153.0 04/11/2020   GLUCOSE 77 09/19/2020   CHOL 189 03/21/2020   TRIG 107.0 03/21/2020   HDL 69.10 03/21/2020   LDLCALC 98 03/21/2020   ALT 46 09/19/2020   AST 34 09/19/2020   NA 139 09/19/2020   K 3.6 09/19/2020   CL 102 09/19/2020   CREATININE 1.10 09/19/2020   BUN 19 09/19/2020   CO2 30 09/19/2020   TSH 0.75 03/21/2020   PSA 2.25 03/21/2020   INR 1.6 (H) 03/21/2020   HGBA1C 5.6 12/19/2014    DG Lumbar Spine Complete  Result Date: 02/15/2019 CLINICAL DATA:  Lower back pain AND rt side leg pain x 4 wks, NKI. EXAM: LUMBAR SPINE - COMPLETE 4+ VIEW COMPARISON:  Lumbar spine radiographs 12/29/2013 FINDINGS: Five non-rib-bearing lumbar-type vertebral bodies. Normal spinal alignment. No evidence of new listhesis. Vertebral body heights are maintained without evidence of fracture. There is moderate disc space loss at L4-5 and L5-S1 with associated endplate sclerosis and minimal anterior spurring. Lower lumbar facet hypertrophy. Nonobstructive bowel gas pattern. The lung bases are clear. IMPRESSION: 1. No acute osseous abnormality. 2. Moderate degenerative disc disease at L4-5 and L5-S1. Electronically Signed   By: Audie Pinto M.D.   On: 02/15/2019 13:36    Assessment & Plan:   [place] was seen today for hypertension.  Diagnoses and all orders for this visit:  Essential hypertension, benign- His blood pressure is adequately well  controlled.  Will continue the current antihypertensives.  I am having [name] maintain his Turmeric, sildenafil, tiZANidine, gabapentin, nitroGLYCERIN, acyclovir, pioglitazone, potassium chloride SA, EQ Allergy Relief (Cetirizine), chlorthalidone, gabapentin, Xarelto, olmesartan, rosuvastatin, and Vitamin D (Ergocalciferol).  No orders of the defined types were placed in this encounter.    Follow-up: No follow-ups on file.  Scarlette Calico, MD

## 2020-11-01 ENCOUNTER — Encounter: Payer: Self-pay | Admitting: Internal Medicine

## 2020-11-22 ENCOUNTER — Other Ambulatory Visit: Payer: Self-pay | Admitting: Internal Medicine

## 2020-11-22 DIAGNOSIS — T502X5A Adverse effect of carbonic-anhydrase inhibitors, benzothiadiazides and other diuretics, initial encounter: Secondary | ICD-10-CM

## 2020-11-22 DIAGNOSIS — E785 Hyperlipidemia, unspecified: Secondary | ICD-10-CM

## 2020-11-22 DIAGNOSIS — E876 Hypokalemia: Secondary | ICD-10-CM

## 2020-11-22 DIAGNOSIS — I10 Essential (primary) hypertension: Secondary | ICD-10-CM

## 2020-12-18 NOTE — Progress Notes (Signed)
[place] Phone: [phone] Subjective:    I'm seeing this patient by the request  of:  [name], MD  I, Peterson Lombard, PhD, LAT, ATC acting as a scribe for Charlann Boxer, DO.  CC: left shoulder   [id number]  10/23/2020 Patient does have some mild calcific changes noted in the supraspinatus tendon.  We will get x-rays to further evaluate.  Discussed with patient about icing regimen and home exercises.  Could potentially do the nitroglycerin if necessary again.  Patient is going to start doing exercises more regularly and icing regimen.  Worsening pain do feel advanced imaging would be warranted.  Patient is in agreement with the plan.  Update 12/19/2020 Keigen Caddell is a 55 y.o. male coming in with complaint of R foot and L shoulder pain. Patient states L shoulder is great, no pain. Pt has been increase his weight and is doing well. Pt is also not having any pain in his R foot any longer.  Left shoulder exam taken on June 1 was independently visualized by me showing no significant bony abnormality.  Past Medical History:  Diagnosis Date   Elevated serum creatinine 06/15/2016   1.66 06/02/16   Fatty liver disease, nonalcoholic 7062   Hyperlipidemia    Hypertension    Leukopenia 01/18/2013   WBC 3,400 01/18/13 was normal 8/22 Xarelto?   Pulmonary embolism ([place]) 10/2012   Right lung   No past surgical history on file. Social History   Socioeconomic History   Marital status: Single    Spouse name: Not on file   Number of children: 0   Years of education: Not on file   Highest education level: Not on file  Occupational History   Occupation: Academic librarian    Employer: Engineer, manufacturing  Tobacco Use   Smoking status: Never   Smokeless tobacco: Never  Substance and Sexual Activity   Alcohol use: Yes    Alcohol/week: 0.0 standard drinks    Comment: occasional-twice a week   Drug use: No   Sexual  activity: Not on file  Other Topics Concern   Not on file  Social History Narrative   Not on file   Social Determinants of Health   Financial Resource Strain: Not on file  Food Insecurity: Not on file  Transportation Needs: Not on file  Physical Activity: Not on file  Stress: Not on file  Social Connections: Not on file   Allergies  Allergen Reactions   Lipitor [Atorvastatin]     Elevated LFT's   Family History  Problem Relation Age of Onset   Hypertension Father    Alcohol abuse Neg Hx    COPD Neg Hx    Diabetes Neg Hx    Drug abuse Neg Hx    Early death Neg Hx    Heart disease Neg Hx    Hyperlipidemia Neg Hx    Kidney disease Neg Hx    Stroke Neg Hx    Lung cancer Father    Breast cancer Mother        mets to brain    Current Outpatient Medications (Endocrine & Metabolic):    pioglitazone (ACTOS) 15 MG tablet, Take 1 tablet by mouth once daily  Current Outpatient Medications (Cardiovascular):    chlorthalidone (HYGROTON) 25 MG tablet, Take 1 tablet by mouth once daily   nitroGLYCERIN (NITRO-DUR) 0.1 mg/hr patch, 1/4 patch daily   olmesartan (BENICAR) 20 MG tablet, Take  1 tablet by mouth once daily   rosuvastatin (CRESTOR) 10 MG tablet, Take 1 tablet by mouth once daily   sildenafil (VIAGRA) 100 MG tablet, Take 1 tablet (100 mg total) by mouth daily as needed for erectile dysfunction.  Current Outpatient Medications (Respiratory):    EQ ALLERGY RELIEF, CETIRIZINE, 10 MG tablet, Take 1 tablet by mouth once daily   Current Outpatient Medications (Hematological):    XARELTO 10 MG TABS tablet, Take 1 tablet by mouth once daily  Current Outpatient Medications (Other):    doxycycline (VIBRA-TABS) 100 MG tablet, Take 1 tablet (100 mg total) by mouth 2 (two) times daily for 7 days.   acyclovir (ZOVIRAX) 400 MG tablet, TAKE 1 TABLET BY MOUTH THREE TIMES DAILY AS NEEDED FOR  FEVER  BLISTERS   gabapentin (NEURONTIN) 100 MG capsule, TAKE 2 CAPSULES BY MOUTH AT BEDTIME    gabapentin (NEURONTIN) 300 MG capsule, Take 1 capsule (300 mg total) by mouth 3 (three) times daily.   potassium chloride SA (KLOR-CON) 20 MEQ tablet, Take 1 tablet by mouth once daily   tiZANidine (ZANAFLEX) 4 MG tablet, TAKE 1 TABLET BY MOUTH EVERY 6 HOURS AS NEEDED FOR MUSCLE SPASM   Turmeric 500 MG CAPS, Take 1 tablet by mouth 2 (two) times daily.   Vitamin D, Ergocalciferol, (DRISDOL) 1.25 MG (50000 UNIT) CAPS capsule, Take 1 capsule (50,000 Units total) by mouth every 7 (seven) days.   Reviewed prior external information including notes and imaging from  primary care provider As well as notes that were available from care everywhere and other healthcare systems.  Past medical history, social, surgical and family history all reviewed in electronic medical record.  No pertanent information unless stated regarding to the chief complaint.   Review of Systems:  No headache, visual changes, nausea, vomiting, diarrhea, constipation, dizziness, abdominal pain, skin rash, fevers, chills, night sweats, weight loss, swollen lymph nodes, body aches, joint swelling, chest pain, shortness of breath, mood changes. POSITIVE muscle aches  Objective  Blood pressure (!) 144/96, pulse (!) 57, height 5' 9"  (1.753 m), weight 218 lb 4.8 oz (99 kg), SpO2 96 %.   General: No apparent distress alert and oriented x3 mood and affect normal, dressed appropriately.  HEENT: Pupils equal, extraocular movements intact  Respiratory: Patient's speak in full sentences and does not appear short of breath  Cardiovascular: No lower extremity edema, non tender, no erythema  Gait normal with good balance and coordination.  MSK: Low back exam does have some mild loss of lordosis.  Patient does have some tightness with FABER test right greater than left.  Patient does have some mild tightness with straight leg test but no radicular symptoms.  Osteopathic findings Sacrum right on right   Impression and Recommendations:      The above documentation has been reviewed and is accurate and complete Lyndal Pulley, DO

## 2020-12-19 ENCOUNTER — Other Ambulatory Visit: Payer: Self-pay

## 2020-12-19 ENCOUNTER — Encounter: Payer: Self-pay | Admitting: Family Medicine

## 2020-12-19 ENCOUNTER — Ambulatory Visit: Payer: BC Managed Care – PPO | Admitting: Family Medicine

## 2020-12-19 DIAGNOSIS — M9904 Segmental and somatic dysfunction of sacral region: Secondary | ICD-10-CM | POA: Insufficient documentation

## 2020-12-19 DIAGNOSIS — M533 Sacrococcygeal disorders, not elsewhere classified: Secondary | ICD-10-CM | POA: Diagnosis not present

## 2020-12-19 DIAGNOSIS — J209 Acute bronchitis, unspecified: Secondary | ICD-10-CM | POA: Insufficient documentation

## 2020-12-19 MED ORDER — DOXYCYCLINE HYCLATE 100 MG PO TABS: 100.0000 mg | ORAL_TABLET | Freq: Two times a day (BID) | ORAL | 0 refills | Status: AC

## 2020-12-19 NOTE — Assessment & Plan Note (Signed)
Patient is having some signs and symptoms consistent with possible bronchitis.  Patient is on blood thinners I do not think we need to worry about the pulmonary embolism.  Patient did have COVID approximately 2 weeks ago and still having some mild discomfort.  Pulse ox otherwise unremarkable at the time.  Patient will be traveling and wants to feel better.  Given a wait-and-see prescription of doxycycline.  Warned of potential side effects.  Patient is in agreement and will read the warnings as well.  Follow-up with me again or another provider if any worsening symptoms.

## 2020-12-19 NOTE — Assessment & Plan Note (Signed)
   Decision today to treat with OMT was based on Physical Exam  After verbal consent patient was treated with HVLA, ME, FPR techniques in sacral areas, all areas are chronic   Patient tolerated the procedure well with improvement in symptoms  Patient given exercises, stretches and lifestyle modifications  See medications in patient instructions if given  Patient will follow up in 4 weeks or as needed

## 2020-12-19 NOTE — Assessment & Plan Note (Signed)
Chronic, with mild exacerbation.  Responded well to manipulation today.  Discussed icing regimen and home exercises.  Continue to be active otherwise.  Follow-up again in 4 to 8 weeks

## 2020-12-19 NOTE — Patient Instructions (Addendum)
Good to see you Stay active Doxycycline 2 times a day for 7 days Glad you are doing so well. See me again when you need me

## 2021-01-05 ENCOUNTER — Other Ambulatory Visit: Payer: Self-pay | Admitting: Internal Medicine

## 2021-01-05 DIAGNOSIS — E876 Hypokalemia: Secondary | ICD-10-CM

## 2021-01-05 DIAGNOSIS — T502X5A Adverse effect of carbonic-anhydrase inhibitors, benzothiadiazides and other diuretics, initial encounter: Secondary | ICD-10-CM

## 2021-01-05 DIAGNOSIS — E785 Hyperlipidemia, unspecified: Secondary | ICD-10-CM

## 2021-01-05 DIAGNOSIS — I10 Essential (primary) hypertension: Secondary | ICD-10-CM

## 2021-01-08 ENCOUNTER — Telehealth: Payer: Self-pay

## 2021-01-08 NOTE — Telephone Encounter (Signed)
Pt called to request an appointment for coughing up mucus since recently having COVID patient was positive 3+ weeks ago. Last test was negative but was 3weeks ago virtual appt made and pt agreed.

## 2021-01-09 ENCOUNTER — Telehealth (INDEPENDENT_AMBULATORY_CARE_PROVIDER_SITE_OTHER): Payer: BC Managed Care – PPO | Admitting: Family Medicine

## 2021-01-09 ENCOUNTER — Encounter: Payer: Self-pay | Admitting: Family Medicine

## 2021-01-09 DIAGNOSIS — R059 Cough, unspecified: Secondary | ICD-10-CM

## 2021-01-09 DIAGNOSIS — R0982 Postnasal drip: Secondary | ICD-10-CM

## 2021-01-09 DIAGNOSIS — R067 Sneezing: Secondary | ICD-10-CM | POA: Diagnosis not present

## 2021-01-09 MED ORDER — BENZONATATE 200 MG PO CAPS
200.0000 mg | ORAL_CAPSULE | Freq: Two times a day (BID) | ORAL | 0 refills | Status: DC | PRN
Start: 2021-01-09 — End: 2021-02-13

## 2021-01-09 NOTE — Patient Instructions (Signed)
-  I sent the medication(s) we discussed to your pharmacy: Meds ordered this encounter  Medications   benzonatate (TESSALON) 200 MG capsule    Sig: Take 1 capsule (200 mg total) by mouth 2 (two) times daily as needed for cough.    Dispense:  20 capsule    Refill:  0   Also change to Allegra once daily and start Flonase 2 sprays each nostril daily for 3 weeks  I hope you are feeling better soon!  Schedule an inperson follow up appointment with your primary care doctor in the next 1-2 weeks.  Seek in person care sooner promptly if your symptoms worsen, new concerns arise or you are not improving with treatment.  It was nice to meet you today. I help [facility] out with telemedicine visits on Tuesdays and Thursdays and am available for visits on those days. If you have any concerns or questions following this visit please schedule a follow up visit with your Primary Care doctor or seek care at a local urgent care clinic to avoid delays in care.

## 2021-01-09 NOTE — Progress Notes (Addendum)
Virtual Visit via Video Note  I connected with Willie Foster  on 01/09/21 at 12:40 PM EDT by a video enabled telemedicine application and verified that I am speaking with the correct person using two identifiers.  Location patient: in his car,  Location provider:work or home office Persons participating in the virtual visit: patient, provider  I discussed the limitations of evaluation and management by telemedicine and the availability of in person appointments. The patient expressed understanding and agreed to proceed.   HPI:  Acute telemedicine visit for cough and pnd: -Onset: about 6-8 weeks ago, did have covid around that time -Symptoms include: persistent intermittent cough he feels is from pnd, sneezing, pnd, nasal congestion -Denies:CP, SOB, NVD, wheezing, inability to eat/drink/get out of bed, weight loss, night sweat, malaise, hemoptysis -Has tried: has been taking cetirizine daily for his allergies -Pertinent past medical history: see below, seasonal allergies -Pertinent medication allergies:  Allergies  Allergen Reactions   Lipitor [Atorvastatin]     Elevated LFT's  -COVID-19 vaccine status: vaccinated x2 + one booster  ROS: See pertinent positives and negatives per HPI.  Past Medical History:  Diagnosis Date   Elevated serum creatinine 06/15/2016   1.66 06/02/16   Fatty liver disease, nonalcoholic 6333   Hyperlipidemia    Hypertension    Leukopenia 01/18/2013   WBC 3,400 01/18/13 was normal 8/22 Xarelto?   Pulmonary embolism ([place]) 10/2012   Right lung    History reviewed. No pertinent surgical history.   Current Outpatient Medications:    acyclovir (ZOVIRAX) 400 MG tablet, TAKE 1 TABLET BY MOUTH THREE TIMES DAILY AS NEEDED FOR  FEVER  BLISTERS, Disp: 30 tablet, Rfl: 2   benzonatate (TESSALON) 200 MG capsule, Take 1 capsule (200 mg total) by mouth 2 (two) times daily as needed for cough., Disp: 20 capsule, Rfl: 0   chlorthalidone (HYGROTON) 25 MG tablet, Take 1 tablet  by mouth once daily, Disp: 90 tablet, Rfl: 0   EQ ALLERGY RELIEF, CETIRIZINE, 10 MG tablet, Take 1 tablet by mouth once daily, Disp: 90 tablet, Rfl: 1   KLOR-CON M20 20 MEQ tablet, TAKE 1  BY MOUTH ONCE DAILY, Disp: 90 tablet, Rfl: 0   olmesartan (BENICAR) 20 MG tablet, Take 1 tablet by mouth once daily, Disp: 90 tablet, Rfl: 0   pioglitazone (ACTOS) 15 MG tablet, Take 1 tablet by mouth once daily, Disp: 90 tablet, Rfl: 1   Turmeric 500 MG CAPS, Take 1 tablet by mouth 2 (two) times daily., Disp: , Rfl:    Vitamin D, Ergocalciferol, (DRISDOL) 1.25 MG (50000 UNIT) CAPS capsule, Take 1 capsule (50,000 Units total) by mouth every 7 (seven) days., Disp: 12 capsule, Rfl: 0   XARELTO 10 MG TABS tablet, Take 1 tablet by mouth once daily, Disp: 90 tablet, Rfl: 1  EXAM:  VITALS per patient if applicable:  GENERAL: alert, oriented, appears well and in no acute distress  HEENT: atraumatic, conjunttiva clear, no obvious abnormalities on inspection of external nose and ears  NECK: normal movements of the head and neck  LUNGS: on inspection no signs of respiratory distress, breathing rate appears normal, no obvious gross SOB, gasping or wheezing  CV: no obvious cyanosis  MS: moves all visible extremities without noticeable abnormality  PSYCH/NEURO: pleasant and cooperative, no obvious depression or anxiety, speech and thought processing grossly intact  ASSESSMENT AND PLAN:  Discussed the following assessment and plan:  Cough  PND (post-nasal drip)  Sneezing  -we discussed possible serious and likely etiologies, options for  evaluation and workup, limitations of telemedicine visit vs in person visit, treatment, treatment risks and precautions. Pt prefers to treat via telemedicine empirically rather than in person at this moment. Query Allergic rhinitis with PND, post covid/viral symptoms vs other. Discussed other causes of chronic cough. He opted to try switching allergy pill to Allegra, starting  INS and using tessalon short term. Advised follow up with PCP in 1-2 inperson. He agrees to schedule.  Advised to seek prompt in person care sooner if worsening, new symptoms arise, or if is not improving with treatment. Discussed options for inperson care if PCP office not available. Did let this patient know that I only do telemedicine on Tuesdays and Thursdays for [place]. Advised to schedule follow up visit with PCP or [facility] if any further questions or concerns to avoid delays in care.   I discussed the assessment and treatment plan with the patient. The patient was provided an opportunity to ask questions and all were answered. The patient agreed with the plan and demonstrated an understanding of the instructions.     Lucretia Kern, DO

## 2021-02-11 DIAGNOSIS — R5383 Other fatigue: Secondary | ICD-10-CM | POA: Diagnosis not present

## 2021-02-13 ENCOUNTER — Telehealth (INDEPENDENT_AMBULATORY_CARE_PROVIDER_SITE_OTHER): Payer: BC Managed Care – PPO | Admitting: Family Medicine

## 2021-02-13 ENCOUNTER — Encounter: Payer: Self-pay | Admitting: Family Medicine

## 2021-02-13 DIAGNOSIS — R0981 Nasal congestion: Secondary | ICD-10-CM | POA: Diagnosis not present

## 2021-02-13 DIAGNOSIS — R059 Cough, unspecified: Secondary | ICD-10-CM

## 2021-02-13 MED ORDER — BENZONATATE 200 MG PO CAPS: 200.0000 mg | ORAL_CAPSULE | Freq: Two times a day (BID) | ORAL | 0 refills | Status: DC | PRN

## 2021-02-13 NOTE — Patient Instructions (Signed)
  HOME CARE TIPS:  -I sent the medication(s) we discussed to your pharmacy: Meds ordered this encounter  Medications   benzonatate (TESSALON) 200 MG capsule    Sig: Take 1 capsule (200 mg total) by mouth 2 (two) times daily as needed for cough.    Dispense:  20 capsule    Refill:  0     -Short 3 day course of Afrin nasal spray. Stop after 3 days.   -flonase 2 sprays each nostril daily for 1 month, then 1 spray each nostril daily (continue until mid winter then can try a break from it)  -allegra once daily (continue until mid winter then can try a break from it)   -stay hydrated, drink plenty of fluids and eat small healthy meals - avoid dairy  -can take 1000 IU (20mg) Vit D3 and 100-500 mg of Vit C daily per instructions  -check a covid test, If the Covid test is positive, check out the [facility] for more information on home care, transmission and treatment for COVID19   It was nice to meet you today, and I really hope you are feeling better soon. I help [facility] out with telemedicine visits on Tuesdays and Thursdays and am available for visits on those days. If you have any concerns or questions following this visit please schedule a follow up visit with your Primary Care doctor or seek care at a local urgent care clinic to avoid delays in care.    Seek in person care or schedule a follow up video visit promptly if your symptoms worsen, new concerns arise or you are not improving with treatment. Consider seeing an Ear, Nose and Throat specialist if continued symptoms. Call 911 and/or seek emergency care if your symptoms are severe or life threatening.

## 2021-02-13 NOTE — Progress Notes (Signed)
Virtual Visit via Video Note  I connected with Willie Foster  on 02/13/21 at  9:00 AM EDT by a video enabled telemedicine application and verified that I am speaking with the correct person using two identifiers.  Location patient: home, [place] Location provider:work or home office Persons participating in the virtual visit: patient, provider  I discussed the limitations of evaluation and management by telemedicine and the availability of in person appointments. The patient expressed understanding and agreed to proceed.   HPI:  Acute telemedicine visit for Nasal Congestion: -Onset:5 days ago -Symptoms include:scratchy throat, nasal congestion, cough, felt tired, some thicker mucus now, PND -Denies:fever, CP, SOB, NVD, inability to eat/drink/get out of bed, body aches -Pertinent PMH:: allergic rhinitis - flonase and allegra worked great for chronic issues he was having - but then he stopped them, just restarted -COVID-19 vaccine status: vaccinated and had a booster  ROS: See pertinent positives and negatives per HPI.  Past Medical History:  Diagnosis Date   Elevated serum creatinine 06/15/2016   1.66 06/02/16   Fatty liver disease, nonalcoholic 2951   Hyperlipidemia    Hypertension    Leukopenia 01/18/2013   WBC 3,400 01/18/13 was normal 8/22 Xarelto?   Pulmonary embolism ([place]) 10/2012   Right lung    History reviewed. No pertinent surgical history.   Current Outpatient Medications:    acyclovir (ZOVIRAX) 400 MG tablet, TAKE 1 TABLET BY MOUTH THREE TIMES DAILY AS NEEDED FOR  FEVER  BLISTERS, Disp: 30 tablet, Rfl: 2   Ascorbic Acid (VITAMIN C PO), Take by mouth daily., Disp: , Rfl:    benzonatate (TESSALON) 200 MG capsule, Take 1 capsule (200 mg total) by mouth 2 (two) times daily as needed for cough., Disp: 20 capsule, Rfl: 0   chlorthalidone (HYGROTON) 25 MG tablet, Take 1 tablet by mouth once daily, Disp: 90 tablet, Rfl: 0   Cholecalciferol (VITAMIN D3 PO), Take 1,000 Units by mouth  daily., Disp: , Rfl:    EQ ALLERGY RELIEF, CETIRIZINE, 10 MG tablet, Take 1 tablet by mouth once daily, Disp: 90 tablet, Rfl: 1   KLOR-CON M20 20 MEQ tablet, TAKE 1  BY MOUTH ONCE DAILY, Disp: 90 tablet, Rfl: 0   Multiple Vitamin (MULTIVITAMIN) tablet, Take 1 tablet by mouth daily., Disp: , Rfl:    olmesartan (BENICAR) 20 MG tablet, Take 1 tablet by mouth once daily, Disp: 90 tablet, Rfl: 0   pioglitazone (ACTOS) 15 MG tablet, Take 1 tablet by mouth once daily, Disp: 90 tablet, Rfl: 1   Turmeric 500 MG CAPS, Take 1 tablet by mouth 2 (two) times daily. (Patient taking differently: Take 1 tablet by mouth daily.), Disp: , Rfl:    XARELTO 10 MG TABS tablet, Take 1 tablet by mouth once daily, Disp: 90 tablet, Rfl: 1  EXAM:  VITALS per patient if applicable:  GENERAL: alert, oriented, appears well and in no acute distress  HEENT: atraumatic, conjunttiva clear, no obvious abnormalities on inspection of external nose and ears  NECK: normal movements of the head and neck  LUNGS: on inspection no signs of respiratory distress, breathing rate appears normal, no obvious gross SOB, gasping or wheezing  CV: no obvious cyanosis  MS: moves all visible extremities without noticeable abnormality  PSYCH/NEURO: pleasant and cooperative, no obvious depression or anxiety, speech and thought processing grossly intact  ASSESSMENT AND PLAN:  Discussed the following assessment and plan:  Nasal congestion  Cough  -we discussed possible serious and likely etiologies, options for evaluation and workup, limitations  of telemedicine visit vs in person visit, treatment, treatment risks and precautions. Pt is agreeable to treatment via telemedicine at this moment. It sounds like underlying AR was responding well to INS and Allegra so advised to use and proper maintenance dose. New symptoms may more likely be a viral infection, covid, vs other. Discussed symptomatic care and opted to try short course nasal  decongestant and Tessalon for cough.  Scheduled follow up with PCP offered: he agrees to schedule inperson follow up with PCP or ENT if worsening or not improving.  Advised to seek prompt in person care if worsening, new symptoms arise, or if is not improving with treatment.   I discussed the assessment and treatment plan with the patient. The patient was provided an opportunity to ask questions and all were answered. The patient agreed with the plan and demonstrated an understanding of the instructions.     Lucretia Kern, DO

## 2021-03-04 NOTE — Progress Notes (Deleted)
  [place] Phone: [phone] Subjective:    I'm seeing this patient by the request  of:  [name], MD  CC:   [id number]  Willie Foster is a 55 y.o. male coming in with complaint of back and neck pain. OMT 12/19/2020. Patient states   Medications patient has been prescribed: None  Taking:         Reviewed prior external information including notes and imaging from previsou exam, outside providers and external EMR if available.   As well as notes that were available from care everywhere and other healthcare systems.  Past medical history, social, surgical and family history all reviewed in electronic medical record.  No pertanent information unless stated regarding to the chief complaint.   Past Medical History:  Diagnosis Date   Elevated serum creatinine 06/15/2016   1.66 06/02/16   Fatty liver disease, nonalcoholic 4696   Hyperlipidemia    Hypertension    Leukopenia 01/18/2013   WBC 3,400 01/18/13 was normal 8/22 Xarelto?   Pulmonary embolism ([place]) 10/2012   Right lung    Allergies  Allergen Reactions   Lipitor [Atorvastatin]     Elevated LFT's     Review of Systems:  No headache, visual changes, nausea, vomiting, diarrhea, constipation, dizziness, abdominal pain, skin rash, fevers, chills, night sweats, weight loss, swollen lymph nodes, body aches, joint swelling, chest pain, shortness of breath, mood changes. POSITIVE muscle aches  Objective  There were no vitals taken for this visit.   General: No apparent distress alert and oriented x3 mood and affect normal, dressed appropriately.  HEENT: Pupils equal, extraocular movements intact  Respiratory: Patient's speak in full sentences and does not appear short of breath  Cardiovascular: No lower extremity edema, non tender, no erythema  Neuro: Cranial nerves II through XII are intact, neurovascularly intact in all extremities with 2+  DTRs and 2+ pulses.  Gait normal with good balance and coordination.  MSK:  Non tender with full range of motion and good stability and symmetric strength and tone of shoulders, elbows, wrist, hip, knee and ankles bilaterally.  Back - Normal skin, Spine with normal alignment and no deformity.  No tenderness to vertebral process palpation.  Paraspinous muscles are not tender and without spasm.   Range of motion is full at neck and lumbar sacral regions  Osteopathic findings  C2 flexed rotated and side bent right C6 flexed rotated and side bent left T3 extended rotated and side bent right inhaled rib T9 extended rotated and side bent left L2 flexed rotated and side bent right Sacrum right on right       Assessment and Plan:    Nonallopathic problems  Decision today to treat with OMT was based on Physical Exam  After verbal consent patient was treated with HVLA, ME, FPR techniques in cervical, rib, thoracic, lumbar, and sacral  areas  Patient tolerated the procedure well with improvement in symptoms  Patient given exercises, stretches and lifestyle modifications  See medications in patient instructions if given  Patient will follow up in 4-8 weeks      The above documentation has been reviewed and is accurate and complete Jacqualin Combes       Note: This dictation was prepared with Dragon dictation along with smaller phrase technology. Any transcriptional errors that result from this process are unintentional.

## 2021-03-05 ENCOUNTER — Ambulatory Visit: Payer: BC Managed Care – PPO | Admitting: Family Medicine

## 2021-03-18 DIAGNOSIS — H10022 Other mucopurulent conjunctivitis, left eye: Secondary | ICD-10-CM | POA: Diagnosis not present

## 2021-03-19 ENCOUNTER — Other Ambulatory Visit: Payer: Self-pay | Admitting: Internal Medicine

## 2021-03-19 DIAGNOSIS — I2782 Chronic pulmonary embolism: Secondary | ICD-10-CM

## 2021-03-25 ENCOUNTER — Other Ambulatory Visit: Payer: Self-pay

## 2021-03-25 ENCOUNTER — Ambulatory Visit (INDEPENDENT_AMBULATORY_CARE_PROVIDER_SITE_OTHER): Payer: BC Managed Care – PPO | Admitting: Internal Medicine

## 2021-03-25 ENCOUNTER — Encounter: Payer: Self-pay | Admitting: Internal Medicine

## 2021-03-25 VITALS — BP 158/96 | HR 70 | Temp 98.2°F | Resp 16 | Ht 69.0 in | Wt 215.0 lb

## 2021-03-25 DIAGNOSIS — I2782 Chronic pulmonary embolism: Secondary | ICD-10-CM | POA: Diagnosis not present

## 2021-03-25 DIAGNOSIS — Z0001 Encounter for general adult medical examination with abnormal findings: Secondary | ICD-10-CM | POA: Diagnosis not present

## 2021-03-25 DIAGNOSIS — T502X5A Adverse effect of carbonic-anhydrase inhibitors, benzothiadiazides and other diuretics, initial encounter: Secondary | ICD-10-CM

## 2021-03-25 DIAGNOSIS — E785 Hyperlipidemia, unspecified: Secondary | ICD-10-CM

## 2021-03-25 DIAGNOSIS — D6859 Other primary thrombophilia: Secondary | ICD-10-CM | POA: Diagnosis not present

## 2021-03-25 DIAGNOSIS — E876 Hypokalemia: Secondary | ICD-10-CM

## 2021-03-25 DIAGNOSIS — I10 Essential (primary) hypertension: Secondary | ICD-10-CM

## 2021-03-25 DIAGNOSIS — K7581 Nonalcoholic steatohepatitis (NASH): Secondary | ICD-10-CM | POA: Diagnosis not present

## 2021-03-25 DIAGNOSIS — Z23 Encounter for immunization: Secondary | ICD-10-CM

## 2021-03-25 DIAGNOSIS — Z125 Encounter for screening for malignant neoplasm of prostate: Secondary | ICD-10-CM | POA: Diagnosis not present

## 2021-03-25 LAB — TSH: TSH: 1.38 u[IU]/mL (ref 0.35–5.50)

## 2021-03-25 LAB — CBC WITH DIFFERENTIAL/PLATELET
Basophils Absolute: 0 10*3/uL (ref 0.0–0.1)
Basophils Relative: 0.4 % (ref 0.0–3.0)
Eosinophils Absolute: 0 10*3/uL (ref 0.0–0.7)
Eosinophils Relative: 1.1 % (ref 0.0–5.0)
HCT: 44.5 % (ref 39.0–52.0)
Hemoglobin: 14.5 g/dL (ref 13.0–17.0)
Lymphocytes Relative: 37.7 % (ref 12.0–46.0)
Lymphs Abs: 1.2 10*3/uL (ref 0.7–4.0)
MCHC: 32.6 g/dL (ref 30.0–36.0)
MCV: 78.3 fl (ref 78.0–100.0)
Monocytes Absolute: 0.3 10*3/uL (ref 0.1–1.0)
Monocytes Relative: 8.2 % (ref 3.0–12.0)
Neutro Abs: 1.7 10*3/uL (ref 1.4–7.7)
Neutrophils Relative %: 52.6 % (ref 43.0–77.0)
Platelets: 169 10*3/uL (ref 150.0–400.0)
RBC: 5.69 Mil/uL (ref 4.22–5.81)
RDW: 14.3 % (ref 11.5–15.5)
WBC: 3.3 10*3/uL — ABNORMAL LOW (ref 4.0–10.5)

## 2021-03-25 LAB — HEPATIC FUNCTION PANEL
ALT: 54 U/L — ABNORMAL HIGH (ref 0–53)
AST: 39 U/L — ABNORMAL HIGH (ref 0–37)
Albumin: 4.6 g/dL (ref 3.5–5.2)
Alkaline Phosphatase: 61 U/L (ref 39–117)
Bilirubin, Direct: 0.1 mg/dL (ref 0.0–0.3)
Total Bilirubin: 0.6 mg/dL (ref 0.2–1.2)
Total Protein: 7.1 g/dL (ref 6.0–8.3)

## 2021-03-25 LAB — BASIC METABOLIC PANEL
BUN: 15 mg/dL (ref 6–23)
CO2: 32 mEq/L (ref 19–32)
Calcium: 9.8 mg/dL (ref 8.4–10.5)
Chloride: 102 mEq/L (ref 96–112)
Creatinine, Ser: 1.03 mg/dL (ref 0.40–1.50)
GFR: 80.79 mL/min (ref >60.00)
Glucose, Bld: 93 mg/dL (ref 70–99)
Potassium: 3.8 mEq/L (ref 3.5–5.1)
Sodium: 141 mEq/L (ref 135–145)

## 2021-03-25 LAB — LIPID PANEL
Cholesterol: 220 mg/dL — ABNORMAL HIGH (ref 0–200)
HDL: 65.1 mg/dL (ref >39.00)
LDL Cholesterol: 128 mg/dL — ABNORMAL HIGH (ref 0–99)
NonHDL: 154.65
Total CHOL/HDL Ratio: 3
Triglycerides: 134 mg/dL (ref 0.0–149.0)
VLDL: 26.8 mg/dL (ref 0.0–40.0)

## 2021-03-25 LAB — URINALYSIS, ROUTINE W REFLEX MICROSCOPIC
Bilirubin Urine: NEGATIVE
Hgb urine dipstick: NEGATIVE
Ketones, ur: NEGATIVE
Leukocytes,Ua: NEGATIVE
Nitrite: NEGATIVE
RBC / HPF: NONE SEEN (ref 0–?)
Specific Gravity, Urine: 1.01 (ref 1.000–1.030)
Total Protein, Urine: 30 — AB
Urine Glucose: NEGATIVE
Urobilinogen, UA: 0.2 (ref 0.0–1.0)
WBC, UA: NONE SEEN (ref 0–?)
pH: 6.5 (ref 5.0–8.0)

## 2021-03-25 LAB — PSA: PSA: 0.73 ng/mL (ref 0.10–4.00)

## 2021-03-25 MED ORDER — CHLORTHALIDONE 25 MG PO TABS: 25.0000 mg | ORAL_TABLET | Freq: Every day | ORAL | 0 refills | Status: DC

## 2021-03-25 MED ORDER — RIVAROXABAN 10 MG PO TABS: 10.0000 mg | ORAL_TABLET | Freq: Every day | ORAL | 1 refills | Status: DC

## 2021-03-25 MED ORDER — PIOGLITAZONE HCL 15 MG PO TABS: 15.0000 mg | ORAL_TABLET | Freq: Every day | ORAL | 1 refills | Status: DC

## 2021-03-25 MED ORDER — POTASSIUM CHLORIDE CRYS ER 20 MEQ PO TBCR: 20.0000 meq | EXTENDED_RELEASE_TABLET | Freq: Every day | ORAL | 0 refills | Status: DC

## 2021-03-25 MED ORDER — OLMESARTAN MEDOXOMIL 20 MG PO TABS: 20.0000 mg | ORAL_TABLET | Freq: Every day | ORAL | 0 refills | Status: DC

## 2021-03-25 NOTE — Patient Instructions (Signed)

## 2021-03-25 NOTE — Progress Notes (Signed)
Subjective:  Patient ID: Willie Foster, male    DOB: 1966/05/09  Age: 55 y.o. MRN: [id number]  CC: Annual Exam and Hypertension  This visit occurred during the SARS-CoV-2 public health emergency.  Safety protocols were in place, including screening questions prior to the visit, additional usage of staff PPE, and extensive cleaning of exam room while observing appropriate contact time as indicated for disinfecting solutions.    HPI Willie Foster presents for a CPX and f/up -   Willie Foster does Crossfit exercises and does not experience CP, DOE, edema, or fatigue.  Outpatient Medications Prior to Visit  Medication Sig Dispense Refill   acyclovir (ZOVIRAX) 400 MG tablet TAKE 1 TABLET BY MOUTH THREE TIMES DAILY AS NEEDED FOR  FEVER  BLISTERS 30 tablet 2   Ascorbic Acid (VITAMIN C PO) Take by mouth daily.     Cholecalciferol (VITAMIN D3 PO) Take 1,000 Units by mouth daily.     EQ ALLERGY RELIEF, CETIRIZINE, 10 MG tablet Take 1 tablet by mouth once daily 90 tablet 1   Multiple Vitamin (MULTIVITAMIN) tablet Take 1 tablet by mouth daily.     Turmeric 500 MG CAPS Take 1 tablet by mouth 2 (two) times daily. (Patient taking differently: Take 1 tablet by mouth daily.)     benzonatate (TESSALON) 200 MG capsule Take 1 capsule (200 mg total) by mouth 2 (two) times daily as needed for cough. 20 capsule 0   chlorthalidone (HYGROTON) 25 MG tablet Take 1 tablet by mouth once daily 90 tablet 0   KLOR-CON M20 20 MEQ tablet TAKE 1  BY MOUTH ONCE DAILY 90 tablet 0   olmesartan (BENICAR) 20 MG tablet Take 1 tablet by mouth once daily 90 tablet 0   pioglitazone (ACTOS) 15 MG tablet Take 1 tablet by mouth once daily 90 tablet 1   XARELTO 10 MG TABS tablet Take 1 tablet by mouth once daily 90 tablet 0   No facility-administered medications prior to visit.    ROS Review of Systems  Constitutional:  Negative for diaphoresis, fatigue and unexpected weight change.  HENT: Negative.    Eyes: Negative.   Respiratory:   Negative for cough, chest tightness, shortness of breath and wheezing.   Cardiovascular:  Negative for chest pain, palpitations and leg swelling.  Gastrointestinal:  Negative for abdominal pain, blood in stool, constipation, nausea and vomiting.  Endocrine: Negative.   Genitourinary: Negative.  Negative for penile swelling and scrotal swelling.  Musculoskeletal: Negative.  Negative for myalgias.  Skin: Negative.   Allergic/Immunologic: Negative.   Neurological: Negative.   Hematological:  Negative for adenopathy. Does not bruise/bleed easily.  Psychiatric/Behavioral: Negative.     Objective:  BP (!) 158/96 (BP Location: Right Arm, Patient Position: Sitting, Cuff Size: Large)   Pulse 70   Temp 98.2 F (36.8 C) (Oral)   Resp 16   Ht 5' 9"  (1.753 m)   Wt 215 lb (97.5 kg)   SpO2 97%   BMI 31.75 kg/m   BP Readings from Last 3 Encounters:  03/25/21 (!) 158/96  12/19/20 (!) 144/96  10/31/20 124/78    Wt Readings from Last 3 Encounters:  03/25/21 215 lb (97.5 kg)  12/19/20 218 lb 4.8 oz (99 kg)  10/31/20 214 lb (97.1 kg)    Physical Exam Vitals reviewed.  Constitutional:      Appearance: Normal appearance.  HENT:     Nose: Nose normal.     Mouth/Throat:     Mouth: Mucous membranes are moist.  Eyes:  General: No scleral icterus.    Conjunctiva/sclera: Conjunctivae normal.  Cardiovascular:     Rate and Rhythm: Regular rhythm. Bradycardia present.     Heart sounds: Normal heart sounds, S1 normal and S2 normal. No murmur heard.   No gallop.     Comments: EKG- SB, 52 bpm No LVH or Q waves Normal ST/T   Pulmonary:     Effort: Pulmonary effort is normal.     Breath sounds: No stridor. No wheezing, rhonchi or rales.  Abdominal:     General: Abdomen is flat.     Palpations: There is no mass.     Tenderness: There is no abdominal tenderness. There is no guarding.     Hernia: No hernia is present. There is no hernia in the left inguinal area or right inguinal area.   Genitourinary:    Pubic Area: No rash.      Penis: Normal and circumcised.      Testes: Normal.     Epididymis:     Right: Normal.     Left: Normal.     Prostate: Normal. Not enlarged, not tender and no nodules present.     Rectum: Normal. Guaiac result negative. No mass, tenderness, anal fissure, external hemorrhoid or internal hemorrhoid. Normal anal tone.  Musculoskeletal:     Cervical back: Neck supple.     Right lower leg: No edema.     Left lower leg: No edema.  Lymphadenopathy:     Cervical: No cervical adenopathy.     Lower Body: No right inguinal adenopathy. No left inguinal adenopathy.  Skin:    General: Skin is warm and dry.     Coloration: Skin is not pale.  Neurological:     General: No focal deficit present.     Mental Status: Willie Foster is alert. Mental status is at baseline.  Psychiatric:        Mood and Affect: Mood normal.        Behavior: Behavior normal.    Lab Results  Component Value Date   WBC 3.3 (L) 03/25/2021   HGB 14.5 03/25/2021   HCT 44.5 03/25/2021   PLT 169.0 03/25/2021   GLUCOSE 93 03/25/2021   CHOL 220 (H) 03/25/2021   TRIG 134.0 03/25/2021   HDL 65.10 03/25/2021   LDLCALC 128 (H) 03/25/2021   ALT 54 (H) 03/25/2021   AST 39 (H) 03/25/2021   NA 141 03/25/2021   K 3.8 03/25/2021   CL 102 03/25/2021   CREATININE 1.03 03/25/2021   BUN 15 03/25/2021   CO2 32 03/25/2021   TSH 1.38 03/25/2021   PSA 0.73 03/25/2021   INR 1.6 (H) 03/21/2020   HGBA1C 5.6 12/19/2014    DG Lumbar Spine Complete  Result Date: 02/15/2019 CLINICAL DATA:  Lower back pain AND rt side leg pain x 4 wks, NKI. EXAM: LUMBAR SPINE - COMPLETE 4+ VIEW COMPARISON:  Lumbar spine radiographs 12/29/2013 FINDINGS: Five non-rib-bearing lumbar-type vertebral bodies. Normal spinal alignment. No evidence of new listhesis. Vertebral body heights are maintained without evidence of fracture. There is moderate disc space loss at L4-5 and L5-S1 with associated endplate sclerosis and minimal  anterior spurring. Lower lumbar facet hypertrophy. Nonobstructive bowel gas pattern. The lung bases are clear. IMPRESSION: 1. No acute osseous abnormality. 2. Moderate degenerative disc disease at L4-5 and L5-S1. Electronically Signed   By: Audie Pinto M.D.   On: 02/15/2019 13:36    Assessment & Plan:   Willie Foster was seen today for annual exam and hypertension.  Diagnoses and all orders for this visit:  Hypercoagulable state, primary ([place])- I will screen for causes or hypercoagulability. -     rivaroxaban (XARELTO) 10 MG TABS tablet; Take 1 tablet (10 mg total) by mouth daily. -     Hypercoagulable panel, comprehensive; Future -     Hypercoagulable panel, comprehensive  Essential hypertension, benign- His BP is not well controlled. Willie Foster will improve his lifestyle modifications. -     chlorthalidone (HYGROTON) 25 MG tablet; Take 1 tablet (25 mg total) by mouth daily. -     potassium chloride SA (KLOR-CON M20) 20 MEQ tablet; Take 1 tablet (20 mEq total) by mouth daily. -     olmesartan (BENICAR) 20 MG tablet; Take 1 tablet (20 mg total) by mouth daily. -     CBC with Differential/Platelet; Future -     Basic metabolic panel; Future -     TSH; Future -     Urinalysis, Routine w reflex microscopic; Future -     Hepatic function panel; Future -     EKG 12-Lead -     Hepatic function panel -     Urinalysis, Routine w reflex microscopic -     TSH -     Basic metabolic panel -     CBC with Differential/Platelet  Diuretic-induced hypokalemia- His K+ is normal now. -     potassium chloride SA (KLOR-CON M20) 20 MEQ tablet; Take 1 tablet (20 mEq total) by mouth daily. -     Basic metabolic panel; Future -     Basic metabolic panel  NASH (nonalcoholic steatohepatitis) - Willie Foster will improve his lifestyle modifications. -     pioglitazone (ACTOS) 15 MG tablet; Take 1 tablet (15 mg total) by mouth daily. -     Hepatic function panel; Future -     Hepatic function panel  Other chronic pulmonary  embolism without acute cor pulmonale (HCC) -     rivaroxaban (XARELTO) 10 MG TABS tablet; Take 1 tablet (10 mg total) by mouth daily. -     Hypercoagulable panel, comprehensive; Future -     Hypercoagulable panel, comprehensive  Encounter for general adult medical examination with abnormal findings- Exam completed, labs reviewed, vaccines updated, cancer screenings are UTD, pt ed material was given. -     Lipid panel; Future -     PSA; Future -     PSA -     Lipid panel  Hyperlipidemia with target LDL less than 130 -     Pitavastatin Calcium (LIVALO) 4 MG TABS; Take 1 tablet (4 mg total) by mouth daily.  Other orders -     Flu Vaccine QUAD 6+ mos PF IM (Fluarix Quad PF)  I have discontinued Willie Foster's benzonatate. I have changed his Klor-Con M20 to potassium chloride SA and Xarelto to rivaroxaban. I have also changed his chlorthalidone, olmesartan, pioglitazone, and Livalo. Additionally, I am having him maintain his Turmeric, acyclovir, EQ Allergy Relief (Cetirizine), Ascorbic Acid (VITAMIN C PO), Cholecalciferol (VITAMIN D3 PO), and multivitamin.  Meds ordered this encounter  Medications   chlorthalidone (HYGROTON) 25 MG tablet    Sig: Take 1 tablet (25 mg total) by mouth daily.    Dispense:  90 tablet    Refill:  0   potassium chloride SA (KLOR-CON M20) 20 MEQ tablet    Sig: Take 1 tablet (20 mEq total) by mouth daily.    Dispense:  90 tablet    Refill:  0   olmesartan (BENICAR)  20 MG tablet    Sig: Take 1 tablet (20 mg total) by mouth daily.    Dispense:  90 tablet    Refill:  0   pioglitazone (ACTOS) 15 MG tablet    Sig: Take 1 tablet (15 mg total) by mouth daily.    Dispense:  90 tablet    Refill:  1   rivaroxaban (XARELTO) 10 MG TABS tablet    Sig: Take 1 tablet (10 mg total) by mouth daily.    Dispense:  90 tablet    Refill:  1   Pitavastatin Calcium (LIVALO) 4 MG TABS    Sig: Take 1 tablet (4 mg total) by mouth daily.    Dispense:  90 tablet    Refill:  1       Follow-up: Return in about 3 months (around 06/25/2021).  Scarlette Calico, MD

## 2021-03-26 ENCOUNTER — Encounter: Payer: Self-pay | Admitting: Internal Medicine

## 2021-03-26 DIAGNOSIS — B0052 Herpesviral keratitis: Secondary | ICD-10-CM | POA: Diagnosis not present

## 2021-03-26 MED ORDER — LIVALO 4 MG PO TABS: 1.0000 | ORAL_TABLET | Freq: Every day | ORAL | 1 refills | Status: DC

## 2021-03-28 ENCOUNTER — Telehealth: Payer: Self-pay

## 2021-03-28 NOTE — Telephone Encounter (Signed)
Per CoverMyMeds:   "This request has received a Cancelled outcome.  This may mean either your patient does not have active coverage with this plan, this authorization was processed as a duplicate request, or an authorization was not needed for this medication."

## 2021-03-28 NOTE — Telephone Encounter (Signed)
Key: [id number]

## 2021-04-02 ENCOUNTER — Encounter: Payer: Self-pay | Admitting: Internal Medicine

## 2021-04-03 LAB — HYPERCOAGULABLE PANEL, COMPREHENSIVE
APTT 1:1 NP: 29.9 s
APTT 1:1 Saline: 36.8 s
APTT: 33.4 s — ABNORMAL HIGH
AT III Act/Nor PPP Chro: 117 %
Act. Prt C Resist w/FV Defic.: 3 ratio
Anticardiolipin Ab, IgG: <10 [GPL'U]
Anticardiolipin Ab, IgM: 11 [MPL'U]
Beta-2 Glycoprotein I, IgA: <10 SAU
Beta-2 Glycoprotein I, IgG: <10 SGU
Beta-2 Glycoprotein I, IgM: <10 SMU
DRVVT Confirm Seconds: 59.3 s
DRVVT Ratio: 1.6 ratio — ABNORMAL HIGH
DRVVT Screen Seconds: 101.8 s — ABNORMAL HIGH
Factor VII Antigen**: 103 %
Factor VIII Activity: 125 %
Hexagonal Phospholipid Neutral: 3 s
Homocysteine: 8.2 umol/L
Prot C Ag Act/Nor PPP Imm: 107 %
Prot S Ag Act/Nor PPP Imm: 124 %
Protein C Ag/FVII Ag Ratio**: 1 ratio
Protein S Ag/FVII Ag Ratio**: 1.2 ratio

## 2021-04-08 ENCOUNTER — Encounter: Payer: Self-pay | Admitting: Internal Medicine

## 2021-04-08 ENCOUNTER — Other Ambulatory Visit: Payer: Self-pay | Admitting: Internal Medicine

## 2021-04-08 DIAGNOSIS — E785 Hyperlipidemia, unspecified: Secondary | ICD-10-CM

## 2021-04-08 MED ORDER — PRAVASTATIN SODIUM 20 MG PO TABS: 20.0000 mg | ORAL_TABLET | Freq: Every day | ORAL | 1 refills | Status: DC

## 2021-07-13 ENCOUNTER — Other Ambulatory Visit: Payer: Self-pay | Admitting: Internal Medicine

## 2021-07-13 DIAGNOSIS — B001 Herpesviral vesicular dermatitis: Secondary | ICD-10-CM

## 2021-08-24 ENCOUNTER — Other Ambulatory Visit: Payer: Self-pay | Admitting: Internal Medicine

## 2021-08-24 DIAGNOSIS — T502X5A Adverse effect of carbonic-anhydrase inhibitors, benzothiadiazides and other diuretics, initial encounter: Secondary | ICD-10-CM

## 2021-08-24 DIAGNOSIS — E876 Hypokalemia: Secondary | ICD-10-CM

## 2021-08-24 DIAGNOSIS — I10 Essential (primary) hypertension: Secondary | ICD-10-CM

## 2021-08-24 DIAGNOSIS — K7581 Nonalcoholic steatohepatitis (NASH): Secondary | ICD-10-CM

## 2021-08-29 ENCOUNTER — Other Ambulatory Visit: Payer: Self-pay | Admitting: Internal Medicine

## 2021-08-29 DIAGNOSIS — B001 Herpesviral vesicular dermatitis: Secondary | ICD-10-CM

## 2021-08-29 DIAGNOSIS — I10 Essential (primary) hypertension: Secondary | ICD-10-CM

## 2021-08-29 DIAGNOSIS — E785 Hyperlipidemia, unspecified: Secondary | ICD-10-CM

## 2021-09-02 NOTE — Progress Notes (Signed)
?Charlann Boxer D.O. ?[facility] ?[place] ?Phone: [phone] ?Subjective:   ? ?I'm seeing this patient by the request  of:  [name], MD ? ?CC: Shoulder pain ? ?SHF:[id number]  ?Willie Foster is a 56 y.o. male coming in with complaint of L shoulder pain. Last seen for L shoulder pain in June 2022. Patient states that his L shoulder is doing great, but does still have some pain in certain positions when doing his crossfit. Patient wants to makes sure he is not doing anything to make things worse for his shoulder when working out, what the limitations are.  ? ? ? ?  ? ?Past Medical History:  ?Diagnosis Date  ? Elevated serum creatinine 06/15/2016  ? 1.66 06/02/16  ? Fatty liver disease, nonalcoholic 0277  ? Hyperlipidemia   ? Hypertension   ? Leukopenia 01/18/2013  ? WBC 3,400 01/18/13 was normal 8/22 Xarelto?  ? Pulmonary embolism ([facility]) 10/2012  ? Right lung  ? ?No past surgical history on file. ?Social History  ? ?Socioeconomic History  ? Marital status: Single  ?  Spouse name: Not on file  ? Number of children: 0  ? Years of education: Not on file  ? Highest education level: Not on file  ?Occupational History  ? Occupation: Academic librarian  ?  Employer: Engineer, manufacturing  ?Tobacco Use  ? Smoking status: Never  ? Smokeless tobacco: Never  ?Substance and Sexual Activity  ? Alcohol use: Yes  ?  Alcohol/week: 0.0 standard drinks  ?  Comment: occasional-twice a week  ? Drug use: No  ? Sexual activity: Not on file  ?Other Topics Concern  ? Not on file  ?Social History Narrative  ? Not on file  ? ?Social Determinants of Health  ? ?Financial Resource Strain: Not on file  ?Food Insecurity: Not on file  ?Transportation Needs: Not on file  ?Physical Activity: Not on file  ?Stress: Not on file  ?Social Connections: Not on file  ? ?Allergies  ?Allergen Reactions  ? Lipitor [Atorvastatin]   ?  Elevated LFT's  ? ?Family History  ?Problem Relation Age of Onset  ? Hypertension Father    ? Alcohol abuse Neg Hx   ? COPD Neg Hx   ? Diabetes Neg Hx   ? Drug abuse Neg Hx   ? Early death Neg Hx   ? Heart disease Neg Hx   ? Hyperlipidemia Neg Hx   ? Kidney disease Neg Hx   ? Stroke Neg Hx   ? Lung cancer Father   ? Breast cancer Mother   ?     mets to brain  ? ? ?Current Outpatient Medications (Endocrine & Metabolic):  ?  pioglitazone (ACTOS) 15 MG tablet, Take 1 tablet by mouth once daily ? ?Current Outpatient Medications (Cardiovascular):  ?  chlorthalidone (HYGROTON) 25 MG tablet, Take 1 tablet by mouth once daily ?  olmesartan (BENICAR) 20 MG tablet, Take 1 tablet by mouth once daily ?  pravastatin (PRAVACHOL) 20 MG tablet, Take 1 tablet by mouth once daily ? ?Current Outpatient Medications (Respiratory):  ?  EQ ALLERGY RELIEF, CETIRIZINE, 10 MG tablet, Take 1 tablet by mouth once daily ? ? ?Current Outpatient Medications (Hematological):  ?  rivaroxaban (XARELTO) 10 MG TABS tablet, Take 1 tablet (10 mg total) by mouth daily. ? ?Current Outpatient Medications (Other):  ?  acyclovir (ZOVIRAX) 400 MG tablet, TAKE 1 TABLET BY MOUTH THREE TIMES DAILY AS NEEDED FOR  FEVER  BLISTERS ?  Ascorbic Acid (VITAMIN C PO), Take by mouth daily. ?  Cholecalciferol (VITAMIN D3 PO), Take 1,000 Units by mouth daily. ?  Multiple Vitamin (MULTIVITAMIN) tablet, Take 1 tablet by mouth daily. ?  potassium chloride SA (KLOR-CON M) 20 MEQ tablet, Take 1 tablet by mouth once daily ?  Turmeric 500 MG CAPS, Take 1 tablet by mouth 2 (two) times daily. (Patient taking differently: Take 1 tablet by mouth daily.) ? ? ?Reviewed prior external information including notes and imaging from  ?primary care provider ?As well as notes that were available from care everywhere and other healthcare systems. ? ?Past medical history, social, surgical and family history all reviewed in electronic medical record.  No pertanent information unless stated regarding to the chief complaint.  ? ?Review of Systems: ? No headache, visual changes, nausea,  vomiting, diarrhea, constipation, dizziness, abdominal pain, skin rash, fevers, chills, night sweats, weight loss, swollen lymph nodes, body aches, joint swelling, chest pain, shortness of breath, mood changes. POSITIVE muscle aches ? ?Objective  ?Blood pressure 110/70, pulse 60, height 5' 9"  (1.753 m), weight 210 lb (95.3 kg), SpO2 98 %. ?  ?General: No apparent distress alert and oriented x3 mood and affect normal, dressed appropriately.  ?HEENT: Pupils equal, extraocular movements intact  ?Respiratory: Patient's speak in full sentences and does not appear short of breath  ?Cardiovascular: No lower extremity edema, non tender, no erythema  ?Gait normal with good balance and coordination.  ?MSK: Left shoulder exam does have a positive impingement noted.  Patient does have good rotator cuff strength noted except for very mild weakness noted with external rotation against resistance.  Good range of motion otherwise.  5 out of 5 strength of the upper extremity. ? ? ?Limited muscular skeletal ultrasound was performed and interpreted by Hulan Saas, M  ?Limited ultrasound of patient's left shoulder does show some hypoechoic changes consistent with more of a tendinitis of the shoulder.  Patient also has some very mild hypoechoic changes above the tendon sheath that is consistent with a bursitis in the subacromial area. ?Impression: Tendinitis of the shoulder. ? ?  ?Impression and Recommendations:  ?  ? ?The above documentation has been reviewed and is accurate and complete Willie Pulley, DO ? ? ? ? ?

## 2021-09-03 ENCOUNTER — Encounter: Payer: Self-pay | Admitting: Family Medicine

## 2021-09-03 ENCOUNTER — Ambulatory Visit: Payer: Self-pay

## 2021-09-03 ENCOUNTER — Ambulatory Visit (INDEPENDENT_AMBULATORY_CARE_PROVIDER_SITE_OTHER): Payer: BC Managed Care – PPO | Admitting: Family Medicine

## 2021-09-03 VITALS — BP 110/70 | HR 60 | Ht 69.0 in | Wt 210.0 lb

## 2021-09-03 DIAGNOSIS — M25512 Pain in left shoulder: Secondary | ICD-10-CM

## 2021-09-03 DIAGNOSIS — M778 Other enthesopathies, not elsewhere classified: Secondary | ICD-10-CM | POA: Insufficient documentation

## 2021-09-03 NOTE — Assessment & Plan Note (Signed)
Tendinitis noted, discussed icing regimen and home exercise, discussed which activities to do and which ones to avoid.  Discussed limiting range of motion.  Patient continues to take the anticoagulant so is not a candidate for ibuprofen but could do topical.  Patient should respond relatively well though to home exercises hopefully.  Discussed icing regimen and home exercises.  Follow-up again in 6 to 8 weeks. ?

## 2021-09-03 NOTE — Patient Instructions (Addendum)
Good to see you  ?Shoulder exercises given ?Voltaren topically up to twice a day  ?Ice after exercise  ?Keep hands with in Periferal vision when lifting ?With all movements underhand rowing ?See me in 6-8 weeks otherwise  ? ?

## 2021-09-11 ENCOUNTER — Other Ambulatory Visit: Payer: Self-pay | Admitting: Internal Medicine

## 2021-09-11 DIAGNOSIS — B001 Herpesviral vesicular dermatitis: Secondary | ICD-10-CM

## 2021-09-17 ENCOUNTER — Other Ambulatory Visit: Payer: Self-pay | Admitting: Internal Medicine

## 2021-09-17 DIAGNOSIS — I10 Essential (primary) hypertension: Secondary | ICD-10-CM

## 2021-09-17 DIAGNOSIS — E876 Hypokalemia: Secondary | ICD-10-CM

## 2021-09-22 ENCOUNTER — Ambulatory Visit: Payer: BC Managed Care – PPO | Admitting: Internal Medicine

## 2021-10-29 ENCOUNTER — Ambulatory Visit: Payer: BC Managed Care – PPO | Admitting: Family Medicine

## 2021-10-30 ENCOUNTER — Ambulatory Visit: Payer: BC Managed Care – PPO | Admitting: Internal Medicine

## 2021-10-30 ENCOUNTER — Encounter: Payer: Self-pay | Admitting: Internal Medicine

## 2021-10-30 VITALS — BP 138/86 | HR 64 | Temp 97.6°F | Resp 16 | Ht 69.0 in | Wt 210.0 lb

## 2021-10-30 DIAGNOSIS — I10 Essential (primary) hypertension: Secondary | ICD-10-CM | POA: Diagnosis not present

## 2021-10-30 DIAGNOSIS — E876 Hypokalemia: Secondary | ICD-10-CM | POA: Diagnosis not present

## 2021-10-30 DIAGNOSIS — T502X5A Adverse effect of carbonic-anhydrase inhibitors, benzothiadiazides and other diuretics, initial encounter: Secondary | ICD-10-CM

## 2021-10-30 DIAGNOSIS — K7581 Nonalcoholic steatohepatitis (NASH): Secondary | ICD-10-CM

## 2021-10-30 DIAGNOSIS — E785 Hyperlipidemia, unspecified: Secondary | ICD-10-CM

## 2021-10-30 DIAGNOSIS — I2782 Chronic pulmonary embolism: Secondary | ICD-10-CM

## 2021-10-30 DIAGNOSIS — D6859 Other primary thrombophilia: Secondary | ICD-10-CM

## 2021-10-30 DIAGNOSIS — Z6831 Body mass index (BMI) 31.0-31.9, adult: Secondary | ICD-10-CM

## 2021-10-30 DIAGNOSIS — E6609 Other obesity due to excess calories: Secondary | ICD-10-CM

## 2021-10-30 LAB — HEPATIC FUNCTION PANEL
ALT: 27 U/L (ref 0–53)
AST: 28 U/L (ref 0–37)
Albumin: 4.5 g/dL (ref 3.5–5.2)
Alkaline Phosphatase: 52 U/L (ref 39–117)
Bilirubin, Direct: 0.1 mg/dL (ref 0.0–0.3)
Total Bilirubin: 0.6 mg/dL (ref 0.2–1.2)
Total Protein: 7.3 g/dL (ref 6.0–8.3)

## 2021-10-30 LAB — BASIC METABOLIC PANEL
BUN: 15 mg/dL (ref 6–23)
CO2: 32 mEq/L (ref 19–32)
Calcium: 10.1 mg/dL (ref 8.4–10.5)
Chloride: 99 mEq/L (ref 96–112)
Creatinine, Ser: 1.06 mg/dL (ref 0.40–1.50)
GFR: 77.73 mL/min (ref >60.00)
Glucose, Bld: 86 mg/dL (ref 70–99)
Potassium: 3.6 mEq/L (ref 3.5–5.1)
Sodium: 138 mEq/L (ref 135–145)

## 2021-10-30 MED ORDER — SEMAGLUTIDE-WEIGHT MANAGEMENT 0.5 MG/0.5ML ~~LOC~~ SOAJ: 0.5000 mg | SUBCUTANEOUS | 0 refills | Status: AC

## 2021-10-30 MED ORDER — SEMAGLUTIDE-WEIGHT MANAGEMENT 0.25 MG/0.5ML ~~LOC~~ SOAJ: 0.2500 mg | SUBCUTANEOUS | 0 refills | Status: AC

## 2021-10-30 MED ORDER — SEMAGLUTIDE-WEIGHT MANAGEMENT 1 MG/0.5ML ~~LOC~~ SOAJ: 1.0000 mg | SUBCUTANEOUS | 0 refills | Status: AC

## 2021-10-30 MED ORDER — SEMAGLUTIDE-WEIGHT MANAGEMENT 1.7 MG/0.75ML ~~LOC~~ SOAJ: 1.7000 mg | SUBCUTANEOUS | 0 refills | Status: AC

## 2021-10-30 MED ORDER — POTASSIUM CHLORIDE CRYS ER 20 MEQ PO TBCR: 20.0000 meq | EXTENDED_RELEASE_TABLET | Freq: Every day | ORAL | 1 refills | Status: DC

## 2021-10-30 MED ORDER — PIOGLITAZONE HCL 15 MG PO TABS: 15.0000 mg | ORAL_TABLET | Freq: Every day | ORAL | 1 refills | Status: DC

## 2021-10-30 MED ORDER — RIVAROXABAN 10 MG PO TABS: 10.0000 mg | ORAL_TABLET | Freq: Every day | ORAL | 1 refills | Status: DC

## 2021-10-30 MED ORDER — PRAVASTATIN SODIUM 20 MG PO TABS: 20.0000 mg | ORAL_TABLET | Freq: Every day | ORAL | 1 refills | Status: DC

## 2021-10-30 MED ORDER — OLMESARTAN MEDOXOMIL 20 MG PO TABS: 20.0000 mg | ORAL_TABLET | Freq: Every day | ORAL | 1 refills | Status: DC

## 2021-10-30 MED ORDER — CHLORTHALIDONE 25 MG PO TABS: 25.0000 mg | ORAL_TABLET | Freq: Every day | ORAL | 1 refills | Status: DC

## 2021-10-30 MED ORDER — SEMAGLUTIDE-WEIGHT MANAGEMENT 2.4 MG/0.75ML ~~LOC~~ SOAJ: 2.4000 mg | SUBCUTANEOUS | 0 refills | Status: AC

## 2021-10-30 NOTE — Patient Instructions (Signed)
Hypertension, Adult High blood pressure (hypertension) is when the force of blood pumping through the arteries is too strong. The arteries are the blood vessels that carry blood from the heart throughout the body. Hypertension forces the heart to work harder to pump blood and may cause arteries to become narrow or stiff. Untreated or uncontrolled hypertension can lead to a heart attack, heart failure, a stroke, kidney disease, and other problems. A blood pressure reading consists of a higher number over a lower number. Ideally, your blood pressure should be below 120/80. The first ("top") number is called the systolic pressure. It is a measure of the pressure in your arteries as your heart beats. The second ("bottom") number is called the diastolic pressure. It is a measure of the pressure in your arteries as the heart relaxes. What are the causes? The exact cause of this condition is not known. There are some conditions that result in high blood pressure. What increases the risk? Certain factors may make you more likely to develop high blood pressure. Some of these risk factors are under your control, including: Smoking. Not getting enough exercise or physical activity. Being overweight. Having too much fat, sugar, calories, or salt (sodium) in your diet. Drinking too much alcohol. Other risk factors include: Having a personal history of heart disease, diabetes, high cholesterol, or kidney disease. Stress. Having a family history of high blood pressure and high cholesterol. Having obstructive sleep apnea. Age. The risk increases with age. What are the signs or symptoms? High blood pressure may not cause symptoms. Very high blood pressure (hypertensive crisis) may cause: Headache. Fast or irregular heartbeats (palpitations). Shortness of breath. Nosebleed. Nausea and vomiting. Vision changes. Severe chest pain, dizziness, and seizures. How is this diagnosed? This condition is diagnosed by  measuring your blood pressure while you are seated, with your arm resting on a flat surface, your legs uncrossed, and your feet flat on the floor. The cuff of the blood pressure monitor will be placed directly against the skin of your upper arm at the level of your heart. Blood pressure should be measured at least twice using the same arm. Certain conditions can cause a difference in blood pressure between your right and left arms. If you have a high blood pressure reading during one visit or you have normal blood pressure with other risk factors, you may be asked to: Return on a different day to have your blood pressure checked again. Monitor your blood pressure at home for 1 week or longer. If you are diagnosed with hypertension, you may have other blood or imaging tests to help your health care provider understand your overall risk for other conditions. How is this treated? This condition is treated by making healthy lifestyle changes, such as eating healthy foods, exercising more, and reducing your alcohol intake. You may be referred for counseling on a healthy diet and physical activity. Your health care provider may prescribe medicine if lifestyle changes are not enough to get your blood pressure under control and if: Your systolic blood pressure is above 130. Your diastolic blood pressure is above 80. Your personal target blood pressure may vary depending on your medical conditions, your age, and other factors. Follow these instructions at home: Eating and drinking  Eat a diet that is high in fiber and potassium, and low in sodium, added sugar, and fat. An example of this eating plan is called the DASH diet. DASH stands for Dietary Approaches to Stop Hypertension. To eat this way: Eat   plenty of fresh fruits and vegetables. Try to fill one half of your plate at each meal with fruits and vegetables. Eat whole grains, such as whole-wheat pasta, brown rice, or whole-grain bread. Fill about one  fourth of your plate with whole grains. Eat or drink low-fat dairy products, such as skim milk or low-fat yogurt. Avoid fatty cuts of meat, processed or cured meats, and poultry with skin. Fill about one fourth of your plate with lean proteins, such as fish, chicken without skin, beans, eggs, or tofu. Avoid pre-made and processed foods. These tend to be higher in sodium, added sugar, and fat. Reduce your daily sodium intake. Many people with hypertension should eat less than 1,500 mg of sodium a day. Do not drink alcohol if: Your health care provider tells you not to drink. You are pregnant, may be pregnant, or are planning to become pregnant. If you drink alcohol: Limit how much you have to: 0-1 drink a day for women. 0-2 drinks a day for men. Know how much alcohol is in your drink. In the [place] one drink equals one 12 oz bottle of beer (355 mL), one 5 oz glass of wine (148 mL), or one 1 oz glass of hard liquor (44 mL). Lifestyle  Work with your health care provider to maintain a healthy body weight or to lose weight. Ask what an ideal weight is for you. Get at least 30 minutes of exercise that causes your heart to beat faster (aerobic exercise) most days of the week. Activities may include walking, swimming, or biking. Include exercise to strengthen your muscles (resistance exercise), such as Pilates or lifting weights, as part of your weekly exercise routine. Try to do these types of exercises for 30 minutes at least 3 days a week. Do not use any products that contain nicotine or tobacco. These products include cigarettes, chewing tobacco, and vaping devices, such as e-cigarettes. If you need help quitting, ask your health care provider. Monitor your blood pressure at home as told by your health care provider. Keep all follow-up visits. This is important. Medicines Take over-the-counter and prescription medicines only as told by your health care provider. Follow directions carefully. Blood  pressure medicines must be taken as prescribed. Do not skip doses of blood pressure medicine. Doing this puts you at risk for problems and can make the medicine less effective. Ask your health care provider about side effects or reactions to medicines that you should watch for. Contact a health care provider if you: Think you are having a reaction to a medicine you are taking. Have headaches that keep coming back (recurring). Feel dizzy. Have swelling in your ankles. Have trouble with your vision. Get help right away if you: Develop a severe headache or confusion. Have unusual weakness or numbness. Feel faint. Have severe pain in your chest or abdomen. Vomit repeatedly. Have trouble breathing. These symptoms may be an emergency. Get help right away. Call 911. Do not wait to see if the symptoms will go away. Do not drive yourself to the hospital. Summary Hypertension is when the force of blood pumping through your arteries is too strong. If this condition is not controlled, it may put you at risk for serious complications. Your personal target blood pressure may vary depending on your medical conditions, your age, and other factors. For most people, a normal blood pressure is less than 120/80. Hypertension is treated with lifestyle changes, medicines, or a combination of both. Lifestyle changes include losing weight, eating a healthy,   low-sodium diet, exercising more, and limiting alcohol. This information is not intended to replace advice given to you by your health care provider. Make sure you discuss any questions you have with your health care provider. Document Revised: 03/18/2021 Document Reviewed: 03/18/2021 Elsevier Patient Education  2023 Elsevier Inc.  

## 2021-10-30 NOTE — Progress Notes (Signed)
Subjective:  Patient ID: Willie Foster, male    DOB: 1966/03/25  Age: 55 y.o. MRN: [id number]  CC: Hypertension   HPI Willie Foster presents for f/up -  He would like to try weekly injection to help him lose weight.  He is active and denies chest pain, shortness of breath, diaphoresis, dizziness, lightheadedness, or edema.  Outpatient Medications Prior to Visit  Medication Sig Dispense Refill   acyclovir (ZOVIRAX) 400 MG tablet TAKE 1 TABLET BY MOUTH THREE TIMES DAILY AS NEEDED FOR  FEVER  BLISTERS 30 tablet 0   Ascorbic Acid (VITAMIN C PO) Take by mouth daily.     Cholecalciferol (VITAMIN D3 PO) Take 1,000 Units by mouth daily.     EQ ALLERGY RELIEF, CETIRIZINE, 10 MG tablet Take 1 tablet by mouth once daily 90 tablet 1   Multiple Vitamin (MULTIVITAMIN) tablet Take 1 tablet by mouth daily.     Turmeric 500 MG CAPS Take 1 tablet by mouth 2 (two) times daily. (Patient taking differently: Take 1 tablet by mouth daily.)     chlorthalidone (HYGROTON) 25 MG tablet Take 1 tablet by mouth once daily 90 tablet 0   olmesartan (BENICAR) 20 MG tablet Take 1 tablet by mouth once daily 90 tablet 0   pioglitazone (ACTOS) 15 MG tablet Take 1 tablet by mouth once daily 90 tablet 0   potassium chloride SA (KLOR-CON M) 20 MEQ tablet Take 1 tablet by mouth once daily 90 tablet 0   pravastatin (PRAVACHOL) 20 MG tablet Take 1 tablet by mouth once daily 90 tablet 0   rivaroxaban (XARELTO) 10 MG TABS tablet Take 1 tablet (10 mg total) by mouth daily. 90 tablet 1   No facility-administered medications prior to visit.    ROS Review of Systems  Constitutional:  Negative for chills, diaphoresis, fatigue and fever.  HENT: Negative.    Eyes: Negative.   Respiratory:  Negative for cough, chest tightness, shortness of breath and wheezing.   Cardiovascular:  Negative for chest pain, palpitations and leg swelling.  Gastrointestinal:  Negative for abdominal pain, constipation, diarrhea, nausea and vomiting.   Endocrine: Negative.   Genitourinary: Negative.  Negative for difficulty urinating.  Musculoskeletal: Negative.  Negative for arthralgias and myalgias.  Skin: Negative.  Negative for color change.  Neurological:  Negative for dizziness, weakness and light-headedness.  Hematological:  Negative for adenopathy. Does not bruise/bleed easily.  Psychiatric/Behavioral: Negative.      Objective:  BP 138/86 (BP Location: Right Arm, Patient Position: Sitting, Cuff Size: Large)   Pulse 64   Temp 97.6 F (36.4 C) (Oral)   Resp 16   Ht 5' 9"  (1.753 m)   Wt 210 lb (95.3 kg)   SpO2 92%   BMI 31.01 kg/m   BP Readings from Last 3 Encounters:  10/30/21 138/86  09/03/21 110/70  03/25/21 (!) 158/96    Wt Readings from Last 3 Encounters:  10/30/21 210 lb (95.3 kg)  09/03/21 210 lb (95.3 kg)  03/25/21 215 lb (97.5 kg)    Physical Exam Vitals reviewed.  HENT:     Nose: Nose normal.     Mouth/Throat:     Mouth: Mucous membranes are moist.  Eyes:     General: No scleral icterus.    Conjunctiva/sclera: Conjunctivae normal.  Cardiovascular:     Rate and Rhythm: Normal rate and regular rhythm.     Heart sounds: No murmur heard. Pulmonary:     Effort: Pulmonary effort is normal.     Breath  sounds: No stridor. No wheezing, rhonchi or rales.  Abdominal:     General: Abdomen is flat.     Palpations: There is no mass.     Tenderness: There is no abdominal tenderness. There is no guarding or rebound.     Hernia: No hernia is present.  Musculoskeletal:        General: Normal range of motion.     Cervical back: Neck supple.     Right lower leg: No edema.     Left lower leg: No edema.  Lymphadenopathy:     Cervical: No cervical adenopathy.  Skin:    General: Skin is warm and dry.     Coloration: Skin is not pale.  Neurological:     General: No focal deficit present.     Mental Status: He is alert.  Psychiatric:        Mood and Affect: Mood normal.        Behavior: Behavior normal.      Lab Results  Component Value Date   WBC 3.3 (L) 03/25/2021   HGB 14.5 03/25/2021   HCT 44.5 03/25/2021   PLT 169.0 03/25/2021   GLUCOSE 86 10/30/2021   CHOL 220 (H) 03/25/2021   TRIG 134.0 03/25/2021   HDL 65.10 03/25/2021   LDLCALC 128 (H) 03/25/2021   ALT 27 10/30/2021   AST 28 10/30/2021   NA 138 10/30/2021   K 3.6 10/30/2021   CL 99 10/30/2021   CREATININE 1.06 10/30/2021   BUN 15 10/30/2021   CO2 32 10/30/2021   TSH 1.38 03/25/2021   PSA 0.73 03/25/2021   INR 1.6 (H) 03/21/2020   HGBA1C 5.6 12/19/2014    DG Lumbar Spine Complete  Result Date: 02/15/2019 CLINICAL DATA:  Lower back pain AND rt side leg pain x 4 wks, NKI. EXAM: LUMBAR SPINE - COMPLETE 4+ VIEW COMPARISON:  Lumbar spine radiographs 12/29/2013 FINDINGS: Five non-rib-bearing lumbar-type vertebral bodies. Normal spinal alignment. No evidence of new listhesis. Vertebral body heights are maintained without evidence of fracture. There is moderate disc space loss at L4-5 and L5-S1 with associated endplate sclerosis and minimal anterior spurring. Lower lumbar facet hypertrophy. Nonobstructive bowel gas pattern. The lung bases are clear. IMPRESSION: 1. No acute osseous abnormality. 2. Moderate degenerative disc disease at L4-5 and L5-S1. Electronically Signed   By: Audie Pinto M.D.   On: 02/15/2019 13:36    Assessment & Plan:   Willie Foster was seen today for hypertension.  Diagnoses and all orders for this visit:  Class 1 obesity due to excess calories with serious comorbidity and body mass index (BMI) of 31.0 to 31.9 in adult -     Semaglutide-Weight Management 0.25 MG/0.5ML SOAJ; Inject 0.25 mg into the skin once a week for 28 days. -     Semaglutide-Weight Management 0.5 MG/0.5ML SOAJ; Inject 0.5 mg into the skin once a week for 28 days. -     Semaglutide-Weight Management 1 MG/0.5ML SOAJ; Inject 1 mg into the skin once a week for 28 days. -     Semaglutide-Weight Management 1.7 MG/0.75ML SOAJ; Inject 1.7  mg into the skin once a week for 28 days. -     Semaglutide-Weight Management 2.4 MG/0.75ML SOAJ; Inject 2.4 mg into the skin once a week for 28 days.  Essential hypertension, benign- His blood pressure is well controlled.  Electrolytes and renal function are normal. -     chlorthalidone (HYGROTON) 25 MG tablet; Take 1 tablet (25 mg total) by mouth daily. -  olmesartan (BENICAR) 20 MG tablet; Take 1 tablet (20 mg total) by mouth daily. -     potassium chloride SA (KLOR-CON M) 20 MEQ tablet; Take 1 tablet (20 mEq total) by mouth daily. -     Basic metabolic panel; Future -     Basic metabolic panel  NASH (nonalcoholic steatohepatitis)- His liver enzymes are normal now. -     pioglitazone (ACTOS) 15 MG tablet; Take 1 tablet (15 mg total) by mouth daily. -     Hepatic function panel; Future -     Hepatic function panel  Diuretic-induced hypokalemia -     potassium chloride SA (KLOR-CON M) 20 MEQ tablet; Take 1 tablet (20 mEq total) by mouth daily. -     Basic metabolic panel; Future -     Basic metabolic panel  Hyperlipidemia with target LDL less than 130 -     pravastatin (PRAVACHOL) 20 MG tablet; Take 1 tablet (20 mg total) by mouth daily.  Other chronic pulmonary embolism without acute cor pulmonale (HCC) -     rivaroxaban (XARELTO) 10 MG TABS tablet; Take 1 tablet (10 mg total) by mouth daily.  Hypercoagulable state, primary ([place]) -     rivaroxaban (XARELTO) 10 MG TABS tablet; Take 1 tablet (10 mg total) by mouth daily.   I have changed Willie Foster's chlorthalidone, olmesartan, pioglitazone, potassium chloride SA, and pravastatin. I am also having him start on Semaglutide-Weight Management, Semaglutide-Weight Management, Semaglutide-Weight Management, Semaglutide-Weight Management, and Semaglutide-Weight Management. Additionally, I am having him maintain his Turmeric, EQ Allergy Relief (Cetirizine), Ascorbic Acid (VITAMIN C PO), Cholecalciferol (VITAMIN D3 PO), multivitamin,  acyclovir, and rivaroxaban.  Meds ordered this encounter  Medications   chlorthalidone (HYGROTON) 25 MG tablet    Sig: Take 1 tablet (25 mg total) by mouth daily.    Dispense:  90 tablet    Refill:  1   olmesartan (BENICAR) 20 MG tablet    Sig: Take 1 tablet (20 mg total) by mouth daily.    Dispense:  90 tablet    Refill:  1   pioglitazone (ACTOS) 15 MG tablet    Sig: Take 1 tablet (15 mg total) by mouth daily.    Dispense:  90 tablet    Refill:  1   potassium chloride SA (KLOR-CON M) 20 MEQ tablet    Sig: Take 1 tablet (20 mEq total) by mouth daily.    Dispense:  90 tablet    Refill:  1   pravastatin (PRAVACHOL) 20 MG tablet    Sig: Take 1 tablet (20 mg total) by mouth daily.    Dispense:  90 tablet    Refill:  1   rivaroxaban (XARELTO) 10 MG TABS tablet    Sig: Take 1 tablet (10 mg total) by mouth daily.    Dispense:  90 tablet    Refill:  1   Semaglutide-Weight Management 0.25 MG/0.5ML SOAJ    Sig: Inject 0.25 mg into the skin once a week for 28 days.    Dispense:  2 mL    Refill:  0   Semaglutide-Weight Management 0.5 MG/0.5ML SOAJ    Sig: Inject 0.5 mg into the skin once a week for 28 days.    Dispense:  2 mL    Refill:  0   Semaglutide-Weight Management 1 MG/0.5ML SOAJ    Sig: Inject 1 mg into the skin once a week for 28 days.    Dispense:  2 mL    Refill:  0  Semaglutide-Weight Management 1.7 MG/0.75ML SOAJ    Sig: Inject 1.7 mg into the skin once a week for 28 days.    Dispense:  3 mL    Refill:  0   Semaglutide-Weight Management 2.4 MG/0.75ML SOAJ    Sig: Inject 2.4 mg into the skin once a week for 28 days.    Dispense:  3 mL    Refill:  0     Follow-up: Return in about 6 months (around 05/01/2022).  Scarlette Calico, MD

## 2021-10-31 ENCOUNTER — Telehealth: Payer: Self-pay | Admitting: *Deleted

## 2021-10-31 NOTE — Telephone Encounter (Signed)
Submitted PA for Ozempic w/  (Key: [id number]). Rec;d msg " This request has received a Favorable outcome from [facility]. Effective from 10/31/2021 through 10/30/2022.Faxed approval to [place].Marland KitchenJohny Chess

## 2021-10-31 NOTE — Telephone Encounter (Signed)
Pt was on cover-my-meds need PA for [facility]. Submitted PA w/ (Key: [id number]) Rec'd instant message " This request has received a Unfavorable outcome" It states will fx pt and MD denial letter w/ reasons...Willie Foster

## 2021-11-03 ENCOUNTER — Telehealth: Payer: Self-pay

## 2021-11-03 NOTE — Telephone Encounter (Signed)
Pt was on cover-my-meds need PA for ozempic. Completed PA w/ (Key: [id number]) . Rec'd msg PA sent to plan for determination...Willie Foster

## 2021-11-03 NOTE — Telephone Encounter (Signed)
Key - [id number]

## 2021-11-03 NOTE — Telephone Encounter (Signed)
Rec'd determination fax med has been " APPROVED" effective 10/31/21 to 10-30-22. Faxed approval to ppf.Marland KitchenJohny Chess

## 2022-01-20 ENCOUNTER — Ambulatory Visit: Payer: BC Managed Care – PPO | Admitting: Emergency Medicine

## 2022-03-26 ENCOUNTER — Ambulatory Visit (INDEPENDENT_AMBULATORY_CARE_PROVIDER_SITE_OTHER): Payer: BC Managed Care – PPO | Admitting: Internal Medicine

## 2022-03-26 ENCOUNTER — Encounter: Payer: Self-pay | Admitting: Internal Medicine

## 2022-03-26 VITALS — BP 136/84 | HR 50 | Temp 98.5°F | Resp 16 | Ht 69.0 in | Wt 209.0 lb

## 2022-03-26 DIAGNOSIS — Z125 Encounter for screening for malignant neoplasm of prostate: Secondary | ICD-10-CM | POA: Diagnosis not present

## 2022-03-26 DIAGNOSIS — Z23 Encounter for immunization: Secondary | ICD-10-CM

## 2022-03-26 DIAGNOSIS — E876 Hypokalemia: Secondary | ICD-10-CM | POA: Diagnosis not present

## 2022-03-26 DIAGNOSIS — T502X5A Adverse effect of carbonic-anhydrase inhibitors, benzothiadiazides and other diuretics, initial encounter: Secondary | ICD-10-CM

## 2022-03-26 DIAGNOSIS — E785 Hyperlipidemia, unspecified: Secondary | ICD-10-CM

## 2022-03-26 DIAGNOSIS — R001 Bradycardia, unspecified: Secondary | ICD-10-CM | POA: Diagnosis not present

## 2022-03-26 DIAGNOSIS — I10 Essential (primary) hypertension: Secondary | ICD-10-CM | POA: Diagnosis not present

## 2022-03-26 DIAGNOSIS — Z0001 Encounter for general adult medical examination with abnormal findings: Secondary | ICD-10-CM

## 2022-03-26 DIAGNOSIS — H43813 Vitreous degeneration, bilateral: Secondary | ICD-10-CM | POA: Diagnosis not present

## 2022-03-26 DIAGNOSIS — Z0000 Encounter for general adult medical examination without abnormal findings: Secondary | ICD-10-CM | POA: Diagnosis not present

## 2022-03-26 LAB — URINALYSIS, ROUTINE W REFLEX MICROSCOPIC
Bilirubin Urine: NEGATIVE
Hgb urine dipstick: NEGATIVE
Ketones, ur: NEGATIVE
Leukocytes,Ua: NEGATIVE
Nitrite: NEGATIVE
RBC / HPF: NONE SEEN (ref 0–?)
Specific Gravity, Urine: 1.015 (ref 1.000–1.030)
Urine Glucose: NEGATIVE
Urobilinogen, UA: 0.2 (ref 0.0–1.0)
WBC, UA: NONE SEEN (ref 0–?)
pH: 6 (ref 5.0–8.0)

## 2022-03-26 LAB — BASIC METABOLIC PANEL
BUN: 18 mg/dL (ref 6–23)
CO2: 32 mEq/L (ref 19–32)
Calcium: 10 mg/dL (ref 8.4–10.5)
Chloride: 101 mEq/L (ref 96–112)
Creatinine, Ser: 1.09 mg/dL (ref 0.40–1.50)
GFR: 74.96 mL/min (ref >60.00)
Glucose, Bld: 104 mg/dL — ABNORMAL HIGH (ref 70–99)
Potassium: 3.8 mEq/L (ref 3.5–5.1)
Sodium: 141 mEq/L (ref 135–145)

## 2022-03-26 LAB — LIPID PANEL
Cholesterol: 250 mg/dL — ABNORMAL HIGH (ref 0–200)
HDL: 68.4 mg/dL (ref >39.00)
LDL Cholesterol: 163 mg/dL — ABNORMAL HIGH (ref 0–99)
NonHDL: 181.61
Total CHOL/HDL Ratio: 4
Triglycerides: 92 mg/dL (ref 0.0–149.0)
VLDL: 18.4 mg/dL (ref 0.0–40.0)

## 2022-03-26 LAB — CBC WITH DIFFERENTIAL/PLATELET
Basophils Absolute: 0 10*3/uL (ref 0.0–0.1)
Basophils Relative: 0.3 % (ref 0.0–3.0)
Eosinophils Absolute: 0.1 10*3/uL (ref 0.0–0.7)
Eosinophils Relative: 1.6 % (ref 0.0–5.0)
HCT: 43.6 % (ref 39.0–52.0)
Hemoglobin: 14.3 g/dL (ref 13.0–17.0)
Lymphocytes Relative: 38.8 % (ref 12.0–46.0)
Lymphs Abs: 1.5 10*3/uL (ref 0.7–4.0)
MCHC: 32.7 g/dL (ref 30.0–36.0)
MCV: 79.5 fl (ref 78.0–100.0)
Monocytes Absolute: 0.4 10*3/uL (ref 0.1–1.0)
Monocytes Relative: 11.1 % (ref 3.0–12.0)
Neutro Abs: 1.8 10*3/uL (ref 1.4–7.7)
Neutrophils Relative %: 48.2 % (ref 43.0–77.0)
Platelets: 156 10*3/uL (ref 150.0–400.0)
RBC: 5.48 Mil/uL (ref 4.22–5.81)
RDW: 14.8 % (ref 11.5–15.5)
WBC: 3.8 10*3/uL — ABNORMAL LOW (ref 4.0–10.5)

## 2022-03-26 LAB — TSH: TSH: 0.86 u[IU]/mL (ref 0.35–5.50)

## 2022-03-26 LAB — PSA: PSA: 0.7 ng/mL (ref 0.10–4.00)

## 2022-03-26 NOTE — Patient Instructions (Signed)
Health Maintenance, Male Adopting a healthy lifestyle and getting preventive care are important in promoting health and wellness. Ask your health care provider about: The right schedule for you to have regular tests and exams. Things you can do on your own to prevent diseases and keep yourself healthy. What should I know about diet, weight, and exercise? Eat a healthy diet  Eat a diet that includes plenty of vegetables, fruits, low-fat dairy products, and lean protein. Do not eat a lot of foods that are high in solid fats, added sugars, or sodium. Maintain a healthy weight Body mass index (BMI) is a measurement that can be used to identify possible weight problems. It estimates body fat based on height and weight. Your health care provider can help determine your BMI and help you achieve or maintain a healthy weight. Get regular exercise Get regular exercise. This is one of the most important things you can do for your health. Most adults should: Exercise for at least 150 minutes each week. The exercise should increase your heart rate and make you sweat (moderate-intensity exercise). Do strengthening exercises at least twice a week. This is in addition to the moderate-intensity exercise. Spend less time sitting. Even light physical activity can be beneficial. Watch cholesterol and blood lipids Have your blood tested for lipids and cholesterol at 58 years of age, then have this test every 5 years. You may need to have your cholesterol levels checked more often if: Your lipid or cholesterol levels are high. You are older than 58 years of age. You are at high risk for heart disease. What should I know about cancer screening? Many types of cancers can be detected early and may often be prevented. Depending on your health history and family history, you may need to have cancer screening at various ages. This may include screening for: Colorectal cancer. Prostate cancer. Skin cancer. Lung  cancer. What should I know about heart disease, diabetes, and high blood pressure? Blood pressure and heart disease High blood pressure causes heart disease and increases the risk of stroke. This is more likely to develop in people who have high blood pressure readings or are overweight. Talk with your health care provider about your target blood pressure readings. Have your blood pressure checked: Every 3-5 years if you are 18-39 years of age. Every year if you are 40 years old or older. If you are between the ages of 65 and 75 and are a current or former smoker, ask your health care provider if you should have a one-time screening for abdominal aortic aneurysm (AAA). Diabetes Have regular diabetes screenings. This checks your fasting blood sugar level. Have the screening done: Once every three years after age 45 if you are at a normal weight and have a low risk for diabetes. More often and at a younger age if you are overweight or have a high risk for diabetes. What should I know about preventing infection? Hepatitis B If you have a higher risk for hepatitis B, you should be screened for this virus. Talk with your health care provider to find out if you are at risk for hepatitis B infection. Hepatitis C Blood testing is recommended for: Everyone born from 1945 through 1965. Anyone with known risk factors for hepatitis C. Sexually transmitted infections (STIs) You should be screened each year for STIs, including gonorrhea and chlamydia, if: You are sexually active and are younger than 58 years of age. You are older than 58 years of age and your   health care provider tells you that you are at risk for this type of infection. Your sexual activity has changed since you were last screened, and you are at increased risk for chlamydia or gonorrhea. Ask your health care provider if you are at risk. Ask your health care provider about whether you are at high risk for HIV. Your health care provider  may recommend a prescription medicine to help prevent HIV infection. If you choose to take medicine to prevent HIV, you should first get tested for HIV. You should then be tested every 3 months for as long as you are taking the medicine. Follow these instructions at home: Alcohol use Do not drink alcohol if your health care provider tells you not to drink. If you drink alcohol: Limit how much you have to 0-2 drinks a day. Know how much alcohol is in your drink. In the [place] one drink equals one 12 oz bottle of beer (355 mL), one 5 oz glass of wine (148 mL), or one 1 oz glass of hard liquor (44 mL). Lifestyle Do not use any products that contain nicotine or tobacco. These products include cigarettes, chewing tobacco, and vaping devices, such as e-cigarettes. If you need help quitting, ask your health care provider. Do not use street drugs. Do not share needles. Ask your health care provider for help if you need support or information about quitting drugs. General instructions Schedule regular health, dental, and eye exams. Stay current with your vaccines. Tell your health care provider if: You often feel depressed. You have ever been abused or do not feel safe at home. Summary Adopting a healthy lifestyle and getting preventive care are important in promoting health and wellness. Follow your health care provider's instructions about healthy diet, exercising, and getting tested or screened for diseases. Follow your health care provider's instructions on monitoring your cholesterol and blood pressure. This information is not intended to replace advice given to you by your health care provider. Make sure you discuss any questions you have with your health care provider. Document Revised: 09/30/2020 Document Reviewed: 09/30/2020 Elsevier Patient Education  2023 Elsevier Inc.  

## 2022-03-26 NOTE — Progress Notes (Signed)
Subjective:  Patient ID: Willie Foster, male    DOB: 04/27/1966  Age: 56 y.o. MRN: [id number]  CC: Annual Exam, Hyperlipidemia, and Hypertension   HPI Willie Foster presents for a CPX and f/up -   He does Cross[employer] exercises and has good endurance.  He denies chest pain, shortness of breath, diaphoresis, palpitations, or edema.  Outpatient Medications Prior to Visit  Medication Sig Dispense Refill   acyclovir (ZOVIRAX) 400 MG tablet TAKE 1 TABLET BY MOUTH THREE TIMES DAILY AS NEEDED FOR  FEVER  BLISTERS 30 tablet 0   Ascorbic Acid (VITAMIN C PO) Take by mouth daily.     chlorthalidone (HYGROTON) 25 MG tablet Take 1 tablet (25 mg total) by mouth daily. 90 tablet 1   Cholecalciferol (VITAMIN D3 PO) Take 1,000 Units by mouth daily.     EQ ALLERGY RELIEF, CETIRIZINE, 10 MG tablet Take 1 tablet by mouth once daily 90 tablet 1   Multiple Vitamin (MULTIVITAMIN) tablet Take 1 tablet by mouth daily.     olmesartan (BENICAR) 20 MG tablet Take 1 tablet (20 mg total) by mouth daily. 90 tablet 1   pioglitazone (ACTOS) 15 MG tablet Take 1 tablet (15 mg total) by mouth daily. 90 tablet 1   potassium chloride SA (KLOR-CON M) 20 MEQ tablet Take 1 tablet (20 mEq total) by mouth daily. 90 tablet 1   rivaroxaban (XARELTO) 10 MG TABS tablet Take 1 tablet (10 mg total) by mouth daily. 90 tablet 1   Turmeric 500 MG CAPS Take 1 tablet by mouth 2 (two) times daily. (Patient taking differently: Take 1 tablet by mouth daily.)     pravastatin (PRAVACHOL) 20 MG tablet Take 1 tablet (20 mg total) by mouth daily. 90 tablet 1   No facility-administered medications prior to visit.    ROS Review of Systems  Constitutional: Negative.   HENT: Negative.    Respiratory:  Negative for cough, chest tightness, shortness of breath and wheezing.   Cardiovascular:  Negative for chest pain, palpitations and leg swelling.  Gastrointestinal:  Negative for abdominal pain, constipation, diarrhea, nausea and vomiting.   Endocrine: Negative.   Genitourinary: Negative.  Negative for difficulty urinating.  Musculoskeletal:  Negative for arthralgias and myalgias.  Skin: Negative.   Neurological:  Negative for dizziness and light-headedness.  Hematological:  Negative for adenopathy. Does not bruise/bleed easily.  Psychiatric/Behavioral: Negative.      Objective:  BP 136/84 (BP Location: Left Arm, Patient Position: Sitting, Cuff Size: Large)   Pulse (!) 50   Temp 98.5 F (36.9 C) (Oral)   Resp 16   Ht 5' 9"  (1.753 m)   Wt 209 lb (94.8 kg)   SpO2 96%   BMI 30.86 kg/m   BP Readings from Last 3 Encounters:  03/26/22 136/84  10/30/21 138/86  09/03/21 110/70    Wt Readings from Last 3 Encounters:  03/26/22 209 lb (94.8 kg)  10/30/21 210 lb (95.3 kg)  09/03/21 210 lb (95.3 kg)    Physical Exam Vitals reviewed.  Constitutional:      Appearance: He is not ill-appearing.  HENT:     Mouth/Throat:     Mouth: Mucous membranes are moist.  Eyes:     General: No scleral icterus.    Conjunctiva/sclera: Conjunctivae normal.  Cardiovascular:     Rate and Rhythm: Regular rhythm. Bradycardia present.     Heart sounds: No murmur heard.    No gallop.     Comments: EKG- SB, 49 bpm ST elevation is  old TWI in III is old LVH V3-V5. Pulmonary:     Effort: Pulmonary effort is normal.     Breath sounds: No stridor. No wheezing, rhonchi or rales.  Abdominal:     Palpations: There is no mass.     Tenderness: There is no abdominal tenderness. There is no guarding.     Hernia: No hernia is present. There is no hernia in the left inguinal area or right inguinal area.  Genitourinary:    Pubic Area: No rash.      Penis: Normal and uncircumcised.      Testes: Normal.     Epididymis:     Right: Normal.     Left: Normal.     Prostate: Normal. Not enlarged, not tender and no nodules present.     Rectum: Normal. Guaiac result negative. No mass, tenderness, anal fissure, external hemorrhoid or internal  hemorrhoid. Normal anal tone.  Musculoskeletal:     Cervical back: Neck supple.     Right lower leg: No edema.     Left lower leg: No edema.  Lymphadenopathy:     Cervical: No cervical adenopathy.     Lower Body: No right inguinal adenopathy. No left inguinal adenopathy.  Skin:    General: Skin is warm and dry.     Findings: No rash.  Neurological:     General: No focal deficit present.     Mental Status: He is alert.  Psychiatric:        Mood and Affect: Mood normal.        Behavior: Behavior normal.     Lab Results  Component Value Date   WBC 3.8 (L) 03/26/2022   HGB 14.3 03/26/2022   HCT 43.6 03/26/2022   PLT 156.0 03/26/2022   GLUCOSE 104 (H) 03/26/2022   CHOL 250 (H) 03/26/2022   TRIG 92.0 03/26/2022   HDL 68.40 03/26/2022   LDLCALC 163 (H) 03/26/2022   ALT 27 10/30/2021   AST 28 10/30/2021   NA 141 03/26/2022   K 3.8 03/26/2022   CL 101 03/26/2022   CREATININE 1.09 03/26/2022   BUN 18 03/26/2022   CO2 32 03/26/2022   TSH 0.86 03/26/2022   PSA 0.70 03/26/2022   INR 1.6 (H) 03/21/2020   HGBA1C 5.6 12/19/2014    DG Lumbar Spine Complete  Result Date: 02/15/2019 CLINICAL DATA:  Lower back pain AND rt side leg pain x 4 wks, NKI. EXAM: LUMBAR SPINE - COMPLETE 4+ VIEW COMPARISON:  Lumbar spine radiographs 12/29/2013 FINDINGS: Five non-rib-bearing lumbar-type vertebral bodies. Normal spinal alignment. No evidence of new listhesis. Vertebral body heights are maintained without evidence of fracture. There is moderate disc space loss at L4-5 and L5-S1 with associated endplate sclerosis and minimal anterior spurring. Lower lumbar facet hypertrophy. Nonobstructive bowel gas pattern. The lung bases are clear. IMPRESSION: 1. No acute osseous abnormality. 2. Moderate degenerative disc disease at L4-5 and L5-S1. Electronically Signed   By: Audie Pinto M.D.   On: 02/15/2019 13:36    Assessment & Plan:   Willie Foster was seen today for annual exam, hyperlipidemia and  hypertension.  Diagnoses and all orders for this visit:  Essential hypertension, benign- His blood pressure is adequately well controlled. -     CBC with Differential/Platelet; Future -     Basic metabolic panel; Future -     TSH; Future -     Cancel: Urinalysis, Routine w reflex microscopic; Future -     Urinalysis, Routine w reflex microscopic; Future -  Urinalysis, Routine w reflex microscopic -     TSH -     Basic metabolic panel -     CBC with Differential/Platelet  Hyperlipidemia with target LDL less than 130- His LDL remains just above 160.  I have asked him to upgrade to a more potent statin. -     Lipid panel; Future -     TSH; Future -     TSH -     Lipid panel -     rosuvastatin (CRESTOR) 20 MG tablet; Take 1 tablet (20 mg total) by mouth daily.  Diuretic-induced hypokalemia -     Basic metabolic panel; Future -     Basic metabolic panel  Bradycardia- EKG is reassuring.  He has no symptoms related to this. -     TSH; Future -     EKG 12-Lead -     TSH  Encounter for general adult medical examination with abnormal findings- Exam completed, labs reviewed, vaccines reviewed and updated, cancer screenings are up-to-date, patient education was given. -     PSA; Future -     PSA  Other orders -     Flu Vaccine QUAD 6+ mos PF IM (Fluarix Quad PF)   I have discontinued Husam Chevere's pravastatin. I am also having him start on rosuvastatin. Additionally, I am having him maintain his Turmeric, EQ Allergy Relief (Cetirizine), Ascorbic Acid (VITAMIN C PO), Cholecalciferol (VITAMIN D3 PO), multivitamin, acyclovir, chlorthalidone, olmesartan, pioglitazone, potassium chloride SA, and rivaroxaban.  Meds ordered this encounter  Medications   rosuvastatin (CRESTOR) 20 MG tablet    Sig: Take 1 tablet (20 mg total) by mouth daily.    Dispense:  90 tablet    Refill:  1     Follow-up: Return in about 6 months (around 09/24/2022).  Scarlette Calico, MD

## 2022-03-27 ENCOUNTER — Encounter: Payer: Self-pay | Admitting: Internal Medicine

## 2022-03-27 MED ORDER — ROSUVASTATIN CALCIUM 20 MG PO TABS: 20.0000 mg | ORAL_TABLET | Freq: Every day | ORAL | 1 refills | Status: DC

## 2022-04-09 DIAGNOSIS — H43813 Vitreous degeneration, bilateral: Secondary | ICD-10-CM | POA: Diagnosis not present

## 2022-05-23 ENCOUNTER — Other Ambulatory Visit: Payer: Self-pay | Admitting: Internal Medicine

## 2022-05-23 DIAGNOSIS — I2782 Chronic pulmonary embolism: Secondary | ICD-10-CM

## 2022-05-23 DIAGNOSIS — I10 Essential (primary) hypertension: Secondary | ICD-10-CM

## 2022-05-23 DIAGNOSIS — D6859 Other primary thrombophilia: Secondary | ICD-10-CM

## 2022-05-23 DIAGNOSIS — K7581 Nonalcoholic steatohepatitis (NASH): Secondary | ICD-10-CM

## 2022-05-31 ENCOUNTER — Other Ambulatory Visit: Payer: Self-pay | Admitting: Internal Medicine

## 2022-05-31 DIAGNOSIS — B001 Herpesviral vesicular dermatitis: Secondary | ICD-10-CM

## 2022-06-29 ENCOUNTER — Encounter: Payer: Self-pay | Admitting: Internal Medicine

## 2022-06-29 ENCOUNTER — Ambulatory Visit: Payer: BC Managed Care – PPO | Admitting: Internal Medicine

## 2022-06-29 VITALS — BP 138/86 | HR 91 | Temp 98.2°F | Ht 69.0 in | Wt 221.0 lb

## 2022-06-29 DIAGNOSIS — R351 Nocturia: Secondary | ICD-10-CM | POA: Diagnosis not present

## 2022-06-29 DIAGNOSIS — I10 Essential (primary) hypertension: Secondary | ICD-10-CM | POA: Diagnosis not present

## 2022-06-29 DIAGNOSIS — R739 Hyperglycemia, unspecified: Secondary | ICD-10-CM | POA: Diagnosis not present

## 2022-06-29 LAB — POCT GLYCOSYLATED HEMOGLOBIN (HGB A1C): Hemoglobin A1C: 5.9 % — AB (ref 4.0–5.6)

## 2022-06-29 NOTE — Progress Notes (Unsigned)
Subjective:  Patient ID: Willie Foster, male    DOB: 08-Nov-1965  Age: 57 y.o. MRN: [id number]  CC: No chief complaint on file.   HPI Willie Foster presents for ***  Outpatient Medications Prior to Visit  Medication Sig Dispense Refill   acyclovir (ZOVIRAX) 400 MG tablet TAKE 1 TABLET BY MOUTH THREE TIMES DAILY AS NEEDED FOR  FEVER  BLISTERS 30 tablet 0   Ascorbic Acid (VITAMIN C PO) Take by mouth daily.     chlorthalidone (HYGROTON) 25 MG tablet Take 1 tablet by mouth once daily 90 tablet 0   Cholecalciferol (VITAMIN D3 PO) Take 1,000 Units by mouth daily.     EQ ALLERGY RELIEF, CETIRIZINE, 10 MG tablet Take 1 tablet by mouth once daily 90 tablet 1   Multiple Vitamin (MULTIVITAMIN) tablet Take 1 tablet by mouth daily.     olmesartan (BENICAR) 20 MG tablet Take 1 tablet by mouth once daily 90 tablet 0   pioglitazone (ACTOS) 15 MG tablet Take 1 tablet by mouth once daily 90 tablet 0   potassium chloride SA (KLOR-CON M) 20 MEQ tablet Take 1 tablet (20 mEq total) by mouth daily. 90 tablet 1   rosuvastatin (CRESTOR) 20 MG tablet Take 1 tablet (20 mg total) by mouth daily. 90 tablet 1   Turmeric 500 MG CAPS Take 1 tablet by mouth 2 (two) times daily. (Patient taking differently: Take 1 tablet by mouth daily.)     XARELTO 10 MG TABS tablet Take 1 tablet by mouth once daily 90 tablet 0   No facility-administered medications prior to visit.    ROS Review of Systems  Objective:  BP 138/86 (BP Location: Right Arm, Patient Position: Sitting, Cuff Size: Large)   Pulse 91   Temp 98.2 F (36.8 C) (Oral)   Ht 5\' 9"  (1.753 m)   Wt 221 lb (100.2 kg)   SpO2 95%   BMI 32.64 kg/m   BP Readings from Last 3 Encounters:  06/29/22 138/86  03/26/22 136/84  10/30/21 138/86    Wt Readings from Last 3 Encounters:  06/29/22 221 lb (100.2 kg)  03/26/22 209 lb (94.8 kg)  10/30/21 210 lb (95.3 kg)    Physical Exam  Lab Results  Component Value Date   WBC 3.8 (L) 03/26/2022   HGB 14.3  03/26/2022   HCT 43.6 03/26/2022   PLT 156.0 03/26/2022   GLUCOSE 104 (H) 03/26/2022   CHOL 250 (H) 03/26/2022   TRIG 92.0 03/26/2022   HDL 68.40 03/26/2022   LDLCALC 163 (H) 03/26/2022   ALT 27 10/30/2021   AST 28 10/30/2021   NA 141 03/26/2022   K 3.8 03/26/2022   CL 101 03/26/2022   CREATININE 1.09 03/26/2022   BUN 18 03/26/2022   CO2 32 03/26/2022   TSH 0.86 03/26/2022   PSA 0.70 03/26/2022   INR 1.6 (H) 03/21/2020   HGBA1C 5.9 (A) 06/29/2022    DG Lumbar Spine Complete  Result Date: 02/15/2019 CLINICAL DATA:  Lower back pain AND rt side leg pain x 4 wks, NKI. EXAM: LUMBAR SPINE - COMPLETE 4+ VIEW COMPARISON:  Lumbar spine radiographs 12/29/2013 FINDINGS: Five non-rib-bearing lumbar-type vertebral bodies. Normal spinal alignment. No evidence of new listhesis. Vertebral body heights are maintained without evidence of fracture. There is moderate disc space loss at L4-5 and L5-S1 with associated endplate sclerosis and minimal anterior spurring. Lower lumbar facet hypertrophy. Nonobstructive bowel gas pattern. The lung bases are clear. IMPRESSION: 1. No acute osseous abnormality. 2. Moderate degenerative  disc disease at L4-5 and L5-S1. Electronically Signed   By: Audie Pinto M.D.   On: 02/15/2019 13:36    Assessment & Plan:   Diagnoses and all orders for this visit:  Chronic hyperglycemia -     POCT glycosylated hemoglobin (Hb A1C)  Nocturia more than twice per night -     Ambulatory referral to Urology   I am having Willie Foster maintain his Turmeric, EQ Allergy Relief (Cetirizine), Ascorbic Acid (VITAMIN C PO), Cholecalciferol (VITAMIN D3 PO), multivitamin, potassium chloride SA, rosuvastatin, chlorthalidone, Xarelto, pioglitazone, olmesartan, and acyclovir.  No orders of the defined types were placed in this encounter.    Follow-up: No follow-ups on file.  Scarlette Calico, MD

## 2022-07-17 DIAGNOSIS — N400 Enlarged prostate without lower urinary tract symptoms: Secondary | ICD-10-CM | POA: Diagnosis not present

## 2022-08-06 ENCOUNTER — Ambulatory Visit: Payer: BC Managed Care – PPO | Admitting: Podiatry

## 2022-08-06 ENCOUNTER — Ambulatory Visit (INDEPENDENT_AMBULATORY_CARE_PROVIDER_SITE_OTHER): Payer: BC Managed Care – PPO

## 2022-08-06 DIAGNOSIS — M792 Neuralgia and neuritis, unspecified: Secondary | ICD-10-CM

## 2022-08-06 DIAGNOSIS — M79672 Pain in left foot: Secondary | ICD-10-CM

## 2022-08-09 NOTE — Progress Notes (Signed)
Subjective: Chief Complaint  Patient presents with   Foot Problem    Left foot great hallux, foot has been itching/ tingling for awhile, intermittently, Patient noticed more when working out which is unusual for him    57-year-old male presents the office today with concerns of left hallux itching sensation which is intermittent.  He cannot recall any inciting incidents which started just.  He is not able to identify any types of shoes or activity to bring symptoms on.  Symptoms at nighttime to the other day he was at [place] and raising his legs up his knee when he noticed it.  He denies any radiating pain or any back pain.  No recent treatment.  No injuries.  Objective: AAO x3, NAD DP/PT pulses palpable bilaterally, CRT less than 3 seconds Hallux malleus noted on the left side with abductus of the hallux bilaterally.  There is no area pinpoint tenderness.  There is negative Seidel sign.  Clinically to be some increased callus formation the distal aspect of the left hallux where the right hallux. No pain with calf compression, swelling, warmth, erythema  Assessment: 57 year old male with neuritis left hallux  Plan: -All treatment options discussed with the patient including all alternatives, risks, complications.  -Next obtained reviewed.  Hallux abductus present with small bone spur off the distal phalanx medially. -I think the symptoms are more from a localized neuritis to the toe.  Does not appear to be radiating or any back issues.  We discussed trying different shoes.  He is not sure how long the symptoms lasted try to monitor the symptoms to see how long.  He does have more callus on the distal left toe compared to right.  More of a chronic issue apparently may be putting more pressure on this toe is causing her symptoms as well. -Patient encouraged to call the office with any questions, concerns, change in symptoms.   Trula Slade DPM

## 2022-08-11 ENCOUNTER — Other Ambulatory Visit: Payer: Self-pay | Admitting: Internal Medicine

## 2022-08-11 DIAGNOSIS — B001 Herpesviral vesicular dermatitis: Secondary | ICD-10-CM

## 2022-08-28 ENCOUNTER — Other Ambulatory Visit: Payer: Self-pay | Admitting: Internal Medicine

## 2022-08-28 DIAGNOSIS — D6859 Other primary thrombophilia: Secondary | ICD-10-CM

## 2022-08-28 DIAGNOSIS — E785 Hyperlipidemia, unspecified: Secondary | ICD-10-CM

## 2022-08-28 DIAGNOSIS — I2782 Chronic pulmonary embolism: Secondary | ICD-10-CM

## 2022-08-28 DIAGNOSIS — I10 Essential (primary) hypertension: Secondary | ICD-10-CM

## 2022-09-24 ENCOUNTER — Ambulatory Visit: Payer: BC Managed Care – PPO | Admitting: Internal Medicine

## 2022-09-24 ENCOUNTER — Encounter: Payer: Self-pay | Admitting: Internal Medicine

## 2022-09-24 VITALS — BP 138/86 | HR 81 | Temp 97.6°F | Resp 16 | Ht 69.0 in | Wt 217.0 lb

## 2022-09-24 DIAGNOSIS — K7581 Nonalcoholic steatohepatitis (NASH): Secondary | ICD-10-CM | POA: Diagnosis not present

## 2022-09-24 DIAGNOSIS — I10 Essential (primary) hypertension: Secondary | ICD-10-CM | POA: Diagnosis not present

## 2022-09-24 DIAGNOSIS — Z23 Encounter for immunization: Secondary | ICD-10-CM

## 2022-09-24 LAB — BASIC METABOLIC PANEL
BUN: 17 mg/dL (ref 6–23)
CO2: 33 mEq/L — ABNORMAL HIGH (ref 19–32)
Calcium: 10.3 mg/dL (ref 8.4–10.5)
Chloride: 99 mEq/L (ref 96–112)
Creatinine, Ser: 1.23 mg/dL (ref 0.40–1.50)
GFR: 64.61 mL/min (ref >60.00)
Glucose, Bld: 96 mg/dL (ref 70–99)
Potassium: 3.6 mEq/L (ref 3.5–5.1)
Sodium: 140 mEq/L (ref 135–145)

## 2022-09-24 LAB — HEPATIC FUNCTION PANEL
ALT: 33 U/L (ref 0–53)
AST: 36 U/L (ref 0–37)
Albumin: 4.5 g/dL (ref 3.5–5.2)
Alkaline Phosphatase: 57 U/L (ref 39–117)
Bilirubin, Direct: 0.1 mg/dL (ref 0.0–0.3)
Total Bilirubin: 0.5 mg/dL (ref 0.2–1.2)
Total Protein: 7.4 g/dL (ref 6.0–8.3)

## 2022-09-24 NOTE — Progress Notes (Signed)
Subjective:  Patient ID: Willie Foster, male    DOB: [birth year]  Age: 57 y.o. MRN: [id number]  CC: Hypertension   HPI Chisholm Conary presents for f/up ----  He is active and denies chest pain, shortness of breath, diaphoresis, or edema.  Outpatient Medications Prior to Visit  Medication Sig Dispense Refill   acyclovir (ZOVIRAX) 400 MG tablet TAKE 1 TABLET BY MOUTH THREE TIMES DAILY AS NEEDED FOR FEVER BLISTERS 30 tablet 2   chlorthalidone (HYGROTON) 25 MG tablet Take 1 tablet by mouth once daily 90 tablet 0   Cholecalciferol (VITAMIN D3 PO) Take 1,000 Units by mouth daily.     EQ ALLERGY RELIEF, CETIRIZINE, 10 MG tablet Take 1 tablet by mouth once daily 90 tablet 1   Multiple Vitamin (MULTIVITAMIN) tablet Take 1 tablet by mouth daily.     NON FORMULARY Potassium, iron, fish oil taken once daily     olmesartan (BENICAR) 20 MG tablet Take 1 tablet by mouth once daily 90 tablet 0   potassium chloride SA (KLOR-CON M) 20 MEQ tablet Take 1 tablet (20 mEq total) by mouth daily. 90 tablet 1   rosuvastatin (CRESTOR) 20 MG tablet Take 1 tablet by mouth once daily 90 tablet 0   Turmeric 500 MG CAPS Take 1 tablet by mouth 2 (two) times daily. (Patient taking differently: Take 1 tablet by mouth daily.)     XARELTO 10 MG TABS tablet Take 1 tablet by mouth once daily 90 tablet 0   Ascorbic Acid (VITAMIN C PO) Take by mouth daily.     pioglitazone (ACTOS) 15 MG tablet Take 1 tablet by mouth once daily 90 tablet 0   No facility-administered medications prior to visit.    ROS Review of Systems  Constitutional: Negative.  Negative for diaphoresis, fatigue and unexpected weight change.  HENT: Negative.    Eyes: Negative.   Respiratory:  Negative for cough, chest tightness, shortness of breath and wheezing.   Cardiovascular:  Negative for chest pain, palpitations and leg swelling.  Gastrointestinal:  Negative for abdominal pain, constipation, diarrhea, nausea and vomiting.  Endocrine: Negative.    Genitourinary: Negative.  Negative for difficulty urinating.  Musculoskeletal:  Negative for arthralgias, myalgias and neck pain.  Skin: Negative.   Neurological:  Negative for dizziness, weakness and numbness.  Hematological:  Negative for adenopathy. Does not bruise/bleed easily.  Psychiatric/Behavioral: Negative.      Objective:  BP 138/86 (BP Location: Left Arm, Patient Position: Sitting, Cuff Size: Large)   Pulse 81   Temp 97.6 F (36.4 C) (Oral)   Resp 16   Ht 5\' 9"  (1.753 m)   Wt 217 lb (98.4 kg)   SpO2 96%   BMI 32.05 kg/m   BP Readings from Last 3 Encounters:  09/24/22 138/86  06/29/22 138/86  03/26/22 136/84    Wt Readings from Last 3 Encounters:  09/24/22 217 lb (98.4 kg)  06/29/22 221 lb (100.2 kg)  03/26/22 209 lb (94.8 kg)    Physical Exam Vitals reviewed.  Constitutional:      Appearance: Normal appearance.  HENT:     Nose: Nose normal.     Mouth/Throat:     Mouth: Mucous membranes are moist.  Eyes:     General: No scleral icterus.    Conjunctiva/sclera: Conjunctivae normal.  Cardiovascular:     Rate and Rhythm: Normal rate and regular rhythm.     Heart sounds: No murmur heard. Pulmonary:     Effort: Pulmonary effort is normal.  Breath sounds: No stridor. No wheezing, rhonchi or rales.  Abdominal:     General: Abdomen is flat.     Palpations: There is no mass.     Tenderness: There is no abdominal tenderness. There is no guarding or rebound.     Hernia: No hernia is present.  Musculoskeletal:        General: Normal range of motion.     Cervical back: Neck supple.     Right lower leg: No edema.     Left lower leg: No edema.  Lymphadenopathy:     Cervical: No cervical adenopathy.  Skin:    General: Skin is warm and dry.  Neurological:     General: No focal deficit present.     Mental Status: He is alert.  Psychiatric:        Mood and Affect: Mood normal.        Behavior: Behavior normal.     Lab Results  Component Value Date    WBC 3.8 (L) 03/26/2022   HGB 14.3 03/26/2022   HCT 43.6 03/26/2022   PLT 156.0 03/26/2022   GLUCOSE 96 09/24/2022   CHOL 250 (H) 03/26/2022   TRIG 92.0 03/26/2022   HDL 68.40 03/26/2022   LDLCALC 163 (H) 03/26/2022   ALT 33 09/24/2022   AST 36 09/24/2022   NA 140 09/24/2022   K 3.6 09/24/2022   CL 99 09/24/2022   CREATININE 1.23 09/24/2022   BUN 17 09/24/2022   CO2 33 (H) 09/24/2022   TSH 0.86 03/26/2022   PSA 0.70 03/26/2022   INR 1.6 (H) 03/21/2020   HGBA1C 5.9 (A) 06/29/2022    DG Lumbar Spine Complete  Result Date: 02/15/2019 CLINICAL DATA:  Lower back pain AND rt side leg pain x 4 wks, NKI. EXAM: LUMBAR SPINE - COMPLETE 4+ VIEW COMPARISON:  Lumbar spine radiographs 12/29/2013 FINDINGS: Five non-rib-bearing lumbar-type vertebral bodies. Normal spinal alignment. No evidence of new listhesis. Vertebral body heights are maintained without evidence of fracture. There is moderate disc space loss at L4-5 and L5-S1 with associated endplate sclerosis and minimal anterior spurring. Lower lumbar facet hypertrophy. Nonobstructive bowel gas pattern. The lung bases are clear. IMPRESSION: 1. No acute osseous abnormality. 2. Moderate degenerative disc disease at L4-5 and L5-S1. Electronically Signed   By: Emmaline Kluver M.D.   On: 02/15/2019 13:36    Assessment & Plan:   NASH (nonalcoholic steatohepatitis)- He has improved his lifestyle modifications and LFTs are normal.  Will discontinue pioglitazone. -     Hepatic function panel; Future  Essential hypertension, benign- His blood pressure is well-controlled.  Electrolytes and renal function were normal. -     Basic metabolic panel; Future  Other orders -     Tdap vaccine greater than or equal to 7yo IM     Follow-up: Return in about 6 months (around 03/27/2023).  Sanda Linger, MD

## 2022-09-24 NOTE — Patient Instructions (Signed)
Hypertension, Adult High blood pressure (hypertension) is when the force of blood pumping through the arteries is too strong. The arteries are the blood vessels that carry blood from the heart throughout the body. Hypertension forces the heart to work harder to pump blood and may cause arteries to become narrow or stiff. Untreated or uncontrolled hypertension can lead to a heart attack, heart failure, a stroke, kidney disease, and other problems. A blood pressure reading consists of a higher number over a lower number. Ideally, your blood pressure should be below 120/80. The first ("top") number is called the systolic pressure. It is a measure of the pressure in your arteries as your heart beats. The second ("bottom") number is called the diastolic pressure. It is a measure of the pressure in your arteries as the heart relaxes. What are the causes? The exact cause of this condition is not known. There are some conditions that result in high blood pressure. What increases the risk? Certain factors may make you more likely to develop high blood pressure. Some of these risk factors are under your control, including: Smoking. Not getting enough exercise or physical activity. Being overweight. Having too much fat, sugar, calories, or salt (sodium) in your diet. Drinking too much alcohol. Other risk factors include: Having a personal history of heart disease, diabetes, high cholesterol, or kidney disease. Stress. Having a family history of high blood pressure and high cholesterol. Having obstructive sleep apnea. Age. The risk increases with age. What are the signs or symptoms? High blood pressure may not cause symptoms. Very high blood pressure (hypertensive crisis) may cause: Headache. Fast or irregular heartbeats (palpitations). Shortness of breath. Nosebleed. Nausea and vomiting. Vision changes. Severe chest pain, dizziness, and seizures. How is this diagnosed? This condition is diagnosed by  measuring your blood pressure while you are seated, with your arm resting on a flat surface, your legs uncrossed, and your feet flat on the floor. The cuff of the blood pressure monitor will be placed directly against the skin of your upper arm at the level of your heart. Blood pressure should be measured at least twice using the same arm. Certain conditions can cause a difference in blood pressure between your right and left arms. If you have a high blood pressure reading during one visit or you have normal blood pressure with other risk factors, you may be asked to: Return on a different day to have your blood pressure checked again. Monitor your blood pressure at home for 1 week or longer. If you are diagnosed with hypertension, you may have other blood or imaging tests to help your health care provider understand your overall risk for other conditions. How is this treated? This condition is treated by making healthy lifestyle changes, such as eating healthy foods, exercising more, and reducing your alcohol intake. You may be referred for counseling on a healthy diet and physical activity. Your health care provider may prescribe medicine if lifestyle changes are not enough to get your blood pressure under control and if: Your systolic blood pressure is above 130. Your diastolic blood pressure is above 80. Your personal target blood pressure may vary depending on your medical conditions, your age, and other factors. Follow these instructions at home: Eating and drinking  Eat a diet that is high in fiber and potassium, and low in sodium, added sugar, and fat. An example of this eating plan is called the DASH diet. DASH stands for Dietary Approaches to Stop Hypertension. To eat this way: Eat   plenty of fresh fruits and vegetables. Try to fill one half of your plate at each meal with fruits and vegetables. Eat whole grains, such as whole-wheat pasta, brown rice, or whole-grain bread. Fill about one  fourth of your plate with whole grains. Eat or drink low-fat dairy products, such as skim milk or low-fat yogurt. Avoid fatty cuts of meat, processed or cured meats, and poultry with skin. Fill about one fourth of your plate with lean proteins, such as fish, chicken without skin, beans, eggs, or tofu. Avoid pre-made and processed foods. These tend to be higher in sodium, added sugar, and fat. Reduce your daily sodium intake. Many people with hypertension should eat less than 1,500 mg of sodium a day. Do not drink alcohol if: Your health care provider tells you not to drink. You are pregnant, may be pregnant, or are planning to become pregnant. If you drink alcohol: Limit how much you have to: 0-1 drink a day for women. 0-2 drinks a day for men. Know how much alcohol is in your drink. In the [place] one drink equals one 12 oz bottle of beer (355 mL), one 5 oz glass of wine (148 mL), or one 1 oz glass of hard liquor (44 mL). Lifestyle  Work with your health care provider to maintain a healthy body weight or to lose weight. Ask what an ideal weight is for you. Get at least 30 minutes of exercise that causes your heart to beat faster (aerobic exercise) most days of the week. Activities may include walking, swimming, or biking. Include exercise to strengthen your muscles (resistance exercise), such as Pilates or lifting weights, as part of your weekly exercise routine. Try to do these types of exercises for 30 minutes at least 3 days a week. Do not use any products that contain nicotine or tobacco. These products include cigarettes, chewing tobacco, and vaping devices, such as e-cigarettes. If you need help quitting, ask your health care provider. Monitor your blood pressure at home as told by your health care provider. Keep all follow-up visits. This is important. Medicines Take over-the-counter and prescription medicines only as told by your health care provider. Follow directions carefully. Blood  pressure medicines must be taken as prescribed. Do not skip doses of blood pressure medicine. Doing this puts you at risk for problems and can make the medicine less effective. Ask your health care provider about side effects or reactions to medicines that you should watch for. Contact a health care provider if you: Think you are having a reaction to a medicine you are taking. Have headaches that keep coming back (recurring). Feel dizzy. Have swelling in your ankles. Have trouble with your vision. Get help right away if you: Develop a severe headache or confusion. Have unusual weakness or numbness. Feel faint. Have severe pain in your chest or abdomen. Vomit repeatedly. Have trouble breathing. These symptoms may be an emergency. Get help right away. Call 911. Do not wait to see if the symptoms will go away. Do not drive yourself to the hospital. Summary Hypertension is when the force of blood pumping through your arteries is too strong. If this condition is not controlled, it may put you at risk for serious complications. Your personal target blood pressure may vary depending on your medical conditions, your age, and other factors. For most people, a normal blood pressure is less than 120/80. Hypertension is treated with lifestyle changes, medicines, or a combination of both. Lifestyle changes include losing weight, eating a healthy,   low-sodium diet, exercising more, and limiting alcohol. This information is not intended to replace advice given to you by your health care provider. Make sure you discuss any questions you have with your health care provider. Document Revised: 03/18/2021 Document Reviewed: 03/18/2021 Elsevier Patient Education  2023 Elsevier Inc.  

## 2022-09-27 ENCOUNTER — Encounter: Payer: Self-pay | Admitting: Internal Medicine

## 2022-10-05 ENCOUNTER — Ambulatory Visit: Payer: BC Managed Care – PPO | Admitting: Internal Medicine

## 2022-10-05 ENCOUNTER — Encounter: Payer: Self-pay | Admitting: Internal Medicine

## 2022-10-05 VITALS — BP 134/82 | HR 64 | Temp 97.9°F | Ht 69.0 in | Wt 217.0 lb

## 2022-10-05 DIAGNOSIS — I10 Essential (primary) hypertension: Secondary | ICD-10-CM

## 2022-10-05 DIAGNOSIS — N522 Drug-induced erectile dysfunction: Secondary | ICD-10-CM | POA: Diagnosis not present

## 2022-10-05 DIAGNOSIS — J069 Acute upper respiratory infection, unspecified: Secondary | ICD-10-CM | POA: Diagnosis not present

## 2022-10-05 MED ORDER — SILDENAFIL CITRATE 20 MG PO TABS
80.0000 mg | ORAL_TABLET | Freq: Every day | ORAL | 3 refills | Status: DC | PRN
Start: 2022-10-05 — End: 2023-03-17

## 2022-10-05 MED ORDER — HYDROCODONE BIT-HOMATROP MBR 5-1.5 MG/5ML PO SOLN
5.0000 mL | Freq: Three times a day (TID) | ORAL | 0 refills | Status: AC | PRN
Start: 2022-10-05 — End: 2022-10-13

## 2022-10-05 NOTE — Progress Notes (Unsigned)
Subjective:  Patient ID: Willie Foster, male    DOB: 02-Nov-1965  Age: 57 y.o. MRN: [id number]  CC: URI   HPI Willie Foster presents for f/up ----  He complains of a 2-day history of scratchy throat and cough productive of white mucus.  He denies chest pain, shortness of breath, night sweats, fever, chills, lymphadenopathy, or rash.  Outpatient Medications Prior to Visit  Medication Sig Dispense Refill   acyclovir (ZOVIRAX) 400 MG tablet TAKE 1 TABLET BY MOUTH THREE TIMES DAILY AS NEEDED FOR FEVER BLISTERS 30 tablet 2   chlorthalidone (HYGROTON) 25 MG tablet Take 1 tablet by mouth once daily 90 tablet 0   Cholecalciferol (VITAMIN D3 PO) Take 1,000 Units by mouth daily.     EQ ALLERGY RELIEF, CETIRIZINE, 10 MG tablet Take 1 tablet by mouth once daily 90 tablet 1   Multiple Vitamin (MULTIVITAMIN) tablet Take 1 tablet by mouth daily.     NON FORMULARY Potassium, iron, fish oil taken once daily     olmesartan (BENICAR) 20 MG tablet Take 1 tablet by mouth once daily 90 tablet 0   potassium chloride SA (KLOR-CON M) 20 MEQ tablet Take 1 tablet (20 mEq total) by mouth daily. 90 tablet 1   rosuvastatin (CRESTOR) 20 MG tablet Take 1 tablet by mouth once daily 90 tablet 0   Turmeric 500 MG CAPS Take 1 tablet by mouth 2 (two) times daily. (Patient taking differently: Take 1 tablet by mouth daily.)     XARELTO 10 MG TABS tablet Take 1 tablet by mouth once daily 90 tablet 0   No facility-administered medications prior to visit.    ROS Review of Systems  Constitutional:  Negative for chills, diaphoresis, fatigue and fever.  HENT:  Positive for sore throat. Negative for trouble swallowing.   Eyes: Negative.   Respiratory:  Positive for cough. Negative for chest tightness, shortness of breath and wheezing.   Cardiovascular:  Negative for chest pain, palpitations and leg swelling.  Gastrointestinal:  Negative for abdominal pain, constipation, diarrhea, nausea and vomiting.  Endocrine: Negative.    Genitourinary: Negative.  Negative for difficulty urinating.  Musculoskeletal: Negative.   Skin: Negative.   Neurological: Negative.  Negative for dizziness and weakness.  Hematological:  Negative for adenopathy. Does not bruise/bleed easily.  Psychiatric/Behavioral: Negative.      Objective:  BP 134/82 (BP Location: Left Arm, Patient Position: Sitting, Cuff Size: Large)   Pulse 64   Temp 97.9 F (36.6 C) (Oral)   Ht 5\' 9"  (1.753 m)   Wt 217 lb (98.4 kg)   BMI 32.05 kg/m   BP Readings from Last 3 Encounters:  10/05/22 134/82  09/24/22 138/86  06/29/22 138/86    Wt Readings from Last 3 Encounters:  10/05/22 217 lb (98.4 kg)  09/24/22 217 lb (98.4 kg)  06/29/22 221 lb (100.2 kg)    Physical Exam Vitals reviewed.  Constitutional:      Appearance: Normal appearance.  HENT:     Mouth/Throat:     Mouth: Mucous membranes are moist.     Pharynx: Posterior oropharyngeal erythema present. No pharyngeal swelling, oropharyngeal exudate or uvula swelling.     Tonsils: No tonsillar exudate or tonsillar abscesses.  Eyes:     General: No scleral icterus. Cardiovascular:     Rate and Rhythm: Normal rate and regular rhythm.     Heart sounds: No murmur heard.    No friction rub.  Pulmonary:     Effort: Pulmonary effort is normal.  Breath sounds: No stridor. No wheezing, rhonchi or rales.  Abdominal:     General: Abdomen is flat.     Palpations: There is no mass.     Tenderness: There is no abdominal tenderness. There is no guarding.     Hernia: No hernia is present.  Musculoskeletal:        General: Normal range of motion.     Cervical back: Neck supple. No tenderness.     Right lower leg: No edema.     Left lower leg: No edema.  Lymphadenopathy:     Cervical: No cervical adenopathy.  Skin:    General: Skin is warm and dry.     Coloration: Skin is not pale.     Findings: No erythema.  Neurological:     General: No focal deficit present.     Mental Status: He is  alert. Mental status is at baseline.     Lab Results  Component Value Date   WBC 3.8 (L) 03/26/2022   HGB 14.3 03/26/2022   HCT 43.6 03/26/2022   PLT 156.0 03/26/2022   GLUCOSE 96 09/24/2022   CHOL 250 (H) 03/26/2022   TRIG 92.0 03/26/2022   HDL 68.40 03/26/2022   LDLCALC 163 (H) 03/26/2022   ALT 33 09/24/2022   AST 36 09/24/2022   NA 140 09/24/2022   K 3.6 09/24/2022   CL 99 09/24/2022   CREATININE 1.23 09/24/2022   BUN 17 09/24/2022   CO2 33 (H) 09/24/2022   TSH 0.86 03/26/2022   PSA 0.70 03/26/2022   INR 1.6 (H) 03/21/2020   HGBA1C 5.9 (A) 06/29/2022    DG Lumbar Spine Complete  Result Date: 02/15/2019 CLINICAL DATA:  Lower back pain AND rt side leg pain x 4 wks, NKI. EXAM: LUMBAR SPINE - COMPLETE 4+ VIEW COMPARISON:  Lumbar spine radiographs 12/29/2013 FINDINGS: Five non-rib-bearing lumbar-type vertebral bodies. Normal spinal alignment. No evidence of new listhesis. Vertebral body heights are maintained without evidence of fracture. There is moderate disc space loss at L4-5 and L5-S1 with associated endplate sclerosis and minimal anterior spurring. Lower lumbar facet hypertrophy. Nonobstructive bowel gas pattern. The lung bases are clear. IMPRESSION: 1. No acute osseous abnormality. 2. Moderate degenerative disc disease at L4-5 and L5-S1. Electronically Signed   By: Emmaline Kluver M.D.   On: 02/15/2019 13:36    Assessment & Plan:   Viral URI with cough- Will offer symptomatic relief.  Antibacterials are not indicated. -     HYDROcodone Bit-Homatrop MBr; Take 5 mLs by mouth every 8 (eight) hours as needed for up to 8 days for cough.  Dispense: 120 mL; Refill: 0  Drug-induced erectile dysfunction -     Sildenafil Citrate; Take 4 tablets (80 mg total) by mouth daily as needed.  Dispense: 60 tablet; Refill: 3  Essential hypertension, benign- His blood pressure is adequately well-controlled.     Follow-up: Return in about 4 months (around 02/05/2023).  Sanda Linger,  MD

## 2022-10-05 NOTE — Patient Instructions (Signed)

## 2022-11-19 ENCOUNTER — Ambulatory Visit: Payer: BC Managed Care – PPO | Admitting: Emergency Medicine

## 2022-11-19 ENCOUNTER — Encounter: Payer: Self-pay | Admitting: Emergency Medicine

## 2022-11-19 VITALS — BP 128/86 | HR 65 | Temp 98.5°F | Ht 69.0 in | Wt 216.2 lb

## 2022-11-19 DIAGNOSIS — R051 Acute cough: Secondary | ICD-10-CM | POA: Diagnosis not present

## 2022-11-19 DIAGNOSIS — J069 Acute upper respiratory infection, unspecified: Secondary | ICD-10-CM | POA: Diagnosis not present

## 2022-11-19 DIAGNOSIS — R0981 Nasal congestion: Secondary | ICD-10-CM

## 2022-11-19 DIAGNOSIS — R0989 Other specified symptoms and signs involving the circulatory and respiratory systems: Secondary | ICD-10-CM | POA: Diagnosis not present

## 2022-11-19 MED ORDER — AZITHROMYCIN 250 MG PO TABS
ORAL_TABLET | ORAL | 0 refills | Status: DC
Start: 2022-11-19 — End: 2022-11-30

## 2022-11-19 NOTE — Assessment & Plan Note (Signed)
Cough management discussed Start over-the-counter Mucinex DM and cough drops Advised to stay well-hydrated Continue NyQuil at bedtime

## 2022-11-19 NOTE — Progress Notes (Signed)
Willie Foster 57 y.o.   Chief Complaint  Patient presents with   Nasal Congestion    Patient is having a lot of head congestion, sore throat x 3 days    Cough    Coughing up clear mucus     HISTORY OF PRESENT ILLNESS: Acute problem visit today.  Patient of Dr. Sanda Linger This is a 57 y.o. male complaining of 3-day history of nasal congestion, sore throat, and cough. Recently traveled to [place].  Returned last Monday. Denies fever or chills.  Able to eat and drink.  Denies nausea vomiting abdominal pain or diarrhea. No other associated symptoms Slowly starting to feel better Sore throat better today.  Still coughing and having chest and nasal congestion No other complaints or medical concerns today.  Cough Associated symptoms include a sore throat. Pertinent negatives include no chest pain, chills, fever, headaches or rash.     Prior to Admission medications   Medication Sig Start Date End Date Taking? Authorizing Provider  acyclovir (ZOVIRAX) 400 MG tablet TAKE 1 TABLET BY MOUTH THREE TIMES DAILY AS NEEDED FOR FEVER BLISTERS 08/11/22  Yes Etta Grandchild, MD  chlorthalidone (HYGROTON) 25 MG tablet Take 1 tablet by mouth once daily 08/28/22  Yes Etta Grandchild, MD  Cholecalciferol (VITAMIN D3 PO) Take 1,000 Units by mouth daily.   Yes [provider]  EQ ALLERGY RELIEF, CETIRIZINE, 10 MG tablet Take 1 tablet by mouth once daily 05/20/20  Yes Etta Grandchild, MD  Multiple Vitamin (MULTIVITAMIN) tablet Take 1 tablet by mouth daily.   Yes [provider]  NON FORMULARY Potassium, iron, fish oil taken once daily   Yes [provider]  olmesartan (BENICAR) 20 MG tablet Take 1 tablet by mouth once daily 08/28/22  Yes Etta Grandchild, MD  potassium chloride SA (KLOR-CON M) 20 MEQ tablet Take 1 tablet (20 mEq total) by mouth daily. 10/30/21  Yes Etta Grandchild, MD  rosuvastatin (CRESTOR) 20 MG tablet Take 1 tablet by mouth once daily 08/28/22  Yes Etta Grandchild,  MD  sildenafil (REVATIO) 20 MG tablet Take 4 tablets (80 mg total) by mouth daily as needed. 10/05/22  Yes Etta Grandchild, MD  XARELTO 10 MG TABS tablet Take 1 tablet by mouth once daily 08/28/22  Yes Etta Grandchild, MD  Turmeric 500 MG CAPS Take 1 tablet by mouth 2 (two) times daily. Patient taking differently: Take 1 tablet by mouth daily. 08/30/13   Judi Saa, DO    Allergies  Allergen Reactions   Lipitor [Atorvastatin]     Elevated LFT's    Patient Active Problem List   Diagnosis Date Noted   Viral URI with cough 10/05/2022   Chronic hyperglycemia 06/29/2022   Class 1 obesity due to excess calories with serious comorbidity and body mass index (BMI) of 31.0 to 31.9 in adult 10/30/2021   Hypercoagulable state, primary (HCC) 03/25/2021   Encounter for general adult medical examination with abnormal findings 03/25/2021   Bradycardia 03/21/2020   Recurrent cold sores 03/21/2020   Lumbar radiculopathy 01/10/2019   GERD with esophagitis 06/30/2018   Seasonal allergic rhinitis due to pollen 01/12/2017   Drug-induced erectile dysfunction 02/04/2016   Routine general medical examination at a health care facility 01/31/2014   NASH (nonalcoholic steatohepatitis) 01/31/2014   Obesity (BMI 30-39.9) 12/07/2012   Pulmonary embolism (HCC) 11/13/2012   Diuretic-induced hypokalemia 09/16/2012   Essential hypertension, benign 07/20/2012   Hyperlipidemia with target LDL less than 130 07/20/2012  Herpes labialis 01/06/2011    Past Medical History:  Diagnosis Date   Elevated serum creatinine 06/15/2016   1.66 06/02/16   Fatty liver disease, nonalcoholic 2011   Hyperlipidemia    Hypertension    Leukopenia 01/18/2013   WBC 3,400 01/18/13 was normal 8/22 Xarelto?   Pulmonary embolism (HCC) 10/2012   Right lung    No past surgical history on file.  Social History   Socioeconomic History   Marital status: Single    Spouse name: Not on file   Number of children: 0   Years of education:  Not on file   Highest education level: Not on file  Occupational History   Occupation: Software engineer    Employer: [employer]  Tobacco Use   Smoking status: Never   Smokeless tobacco: Never  Substance and Sexual Activity   Alcohol use: Yes    Alcohol/week: 0.0 standard drinks of alcohol    Comment: occasional-twice a week   Drug use: No   Sexual activity: Not on file  Other Topics Concern   Not on file  Social History Narrative   Not on file   Social Determinants of Health   Financial Resource Strain: Not on file  Food Insecurity: Not on file  Transportation Needs: Not on file  Physical Activity: Not on file  Stress: Not on file  Social Connections: Not on file  Intimate Partner Violence: Not on file    Family History  Problem Relation Age of Onset   Hypertension Father    Alcohol abuse Neg Hx    COPD Neg Hx    Diabetes Neg Hx    Drug abuse Neg Hx    Early death Neg Hx    Heart disease Neg Hx    Hyperlipidemia Neg Hx    Kidney disease Neg Hx    Stroke Neg Hx    Lung cancer Father    Breast cancer Mother        mets to brain     Review of Systems  Constitutional: Negative.  Negative for chills and fever.  HENT:  Positive for congestion and sore throat.   Respiratory:  Positive for cough.   Cardiovascular: Negative.  Negative for chest pain and palpitations.  Gastrointestinal:  Negative for abdominal pain, diarrhea, nausea and vomiting.  Skin: Negative.  Negative for rash.  Neurological: Negative.  Negative for dizziness and headaches.  All other systems reviewed and are negative.  Vitals:   11/19/22 0831  BP: 128/86  Pulse: 65  Temp: 98.5 F (36.9 C)  SpO2: 97%      Physical Exam Vitals reviewed.  Constitutional:      Appearance: Normal appearance.  HENT:     Head: Normocephalic.     Right Ear: Tympanic membrane, ear canal and external ear normal.     Left Ear: Tympanic membrane, ear canal and external ear normal.      Mouth/Throat:     Mouth: Mucous membranes are moist.     Pharynx: Oropharynx is clear.  Eyes:     Extraocular Movements: Extraocular movements intact.     Conjunctiva/sclera: Conjunctivae normal.     Pupils: Pupils are equal, round, and reactive to light.  Cardiovascular:     Rate and Rhythm: Normal rate and regular rhythm.     Pulses: Normal pulses.     Heart sounds: Normal heart sounds.  Pulmonary:     Effort: Pulmonary effort is normal.     Breath sounds: Normal breath sounds.  Musculoskeletal:     Cervical back: No tenderness.  Lymphadenopathy:     Cervical: No cervical adenopathy.  Skin:    General: Skin is warm and dry.  Neurological:     Mental Status: He is alert and oriented to person, place, and time.  Psychiatric:        Mood and Affect: Mood normal.        Behavior: Behavior normal.      ASSESSMENT & PLAN: Problem List Items Addressed This Visit       Respiratory   Upper respiratory tract infection - Primary    Clinically stable.  Running its course. May have secondary bacterial infection May have sinus infection Recently traveled abroad. No red flag signs or symptoms May benefit from daily azithromycin for 5 days.      Relevant Medications   azithromycin (ZITHROMAX) 250 MG tablet   Sinus congestion    Advised to use saline nasal spray frequently Advised to take over-the-counter Mucinex DM Antibiotic might help      Chest congestion    Start over-the-counter Mucinex DM Advised to stay well-hydrated and use cough drops as needed        Other   Acute cough    Cough management discussed Start over-the-counter Mucinex DM and cough drops Advised to stay well-hydrated Continue NyQuil at bedtime      Patient Instructions  Start azithromycin Start over-the-counter Mucinex DM Stay well-hydrated Contact the office if no better or worse during the next several days  Upper Respiratory Infection, Adult An upper respiratory infection (URI)  affects the nose, throat, and upper airways that lead to the lungs. The most common type of URI is often called the common cold. URIs usually get better on their own, without medical treatment. What are the causes? A URI is caused by a germ (virus). You may catch these germs by: Breathing in droplets from an infected person's cough or sneeze. Touching something that has the germ on it (is contaminated) and then touching your mouth, nose, or eyes. What increases the risk? You are more likely to get a URI if: You are very young or very old. You have close contact with others, such as at work, school, or a health care facility. You smoke. You have long-term (chronic) heart or lung disease. You have a weakened disease-fighting system (immune system). You have nasal allergies or asthma. You have a lot of stress. You have poor nutrition. What are the signs or symptoms? Runny or stuffy (congested) nose. Cough. Sneezing. Sore throat. Headache. Feeling tired (fatigue). Fever. Not wanting to eat as much as usual. Pain in your forehead, behind your eyes, and over your cheekbones (sinus pain). Muscle aches. Redness or irritation of the eyes. Pressure in the ears or face. How is this treated? URIs usually get better on their own within 7-10 days. Medicines cannot cure URIs, but your doctor may recommend certain medicines to help relieve symptoms, such as: Over-the-counter cold medicines. Medicines to reduce coughing (cough suppressants). Coughing is a type of defense against infection that helps to clear the nose, throat, windpipe, and lungs (respiratory system). Take these medicines only as told by your doctor. Medicines to lower your fever. Follow these instructions at home: Activity Rest as needed. If you have a fever, stay home from work or school until your fever is gone, or until your doctor says you may return to work or school. You should stay home until you cannot spread the  infection anymore (you are  not contagious). Your doctor may have you wear a face mask so you have less risk of spreading the infection. Relieving symptoms Rinse your mouth often with salt water. To make salt water, dissolve -1 tsp (3-6 g) of salt in 1 cup (237 mL) of warm water. Use a cool-mist humidifier to add moisture to the air. This can help you breathe more easily. Eating and drinking  Drink enough fluid to keep your pee (urine) pale yellow. Eat soups and other clear broths. General instructions  Take over-the-counter and prescription medicines only as told by your doctor. Do not smoke or use any products that contain nicotine or tobacco. If you need help quitting, ask your doctor. Avoid being where people are smoking (avoid secondhand smoke). Stay up to date on all your shots (immunizations), and get the flu shot every year. Keep all follow-up visits. How to prevent the spread of infection to others  Wash your hands with soap and water for at least 20 seconds. If you cannot use soap and water, use hand sanitizer. Avoid touching your mouth, face, eyes, or nose. Cough or sneeze into a tissue or your sleeve or elbow. Do not cough or sneeze into your hand or into the air. Contact a doctor if: You are getting worse, not better. You have any of these: A fever or chills. Brown or red mucus in your nose. Yellow or brown fluid (discharge)coming from your nose. Pain in your face, especially when you bend forward. Swollen neck glands. Pain when you swallow. White areas in the back of your throat. Get help right away if: You have shortness of breath that gets worse. You have very bad or constant: Headache. Ear pain. Pain in your forehead, behind your eyes, and over your cheekbones (sinus pain). Chest pain. You have long-lasting (chronic) lung disease along with any of these: Making high-pitched whistling sounds when you breathe, most often when you breathe out  (wheezing). Long-lasting cough (more than 14 days). Coughing up blood. A change in your usual mucus. You have a stiff neck. You have changes in your: Vision. Hearing. Thinking. Mood. These symptoms may be an emergency. Get help right away. Call 911. Do not wait to see if the symptoms will go away. Do not drive yourself to the hospital. Summary An upper respiratory infection (URI) is caused by a germ (virus). The most common type of URI is often called the common cold. URIs usually get better within 7-10 days. Take over-the-counter and prescription medicines only as told by your doctor. This information is not intended to replace advice given to you by your health care provider. Make sure you discuss any questions you have with your health care provider. Document Revised: 12/11/2020 Document Reviewed: 12/11/2020 Elsevier Patient Education  2024 [employer]      Edwina Barth, MD [facility]

## 2022-11-19 NOTE — Assessment & Plan Note (Signed)
Advised to use saline nasal spray frequently Advised to take over-the-counter Mucinex DM Antibiotic might help

## 2022-11-19 NOTE — Assessment & Plan Note (Signed)
Start over-the-counter Mucinex DM Advised to stay well-hydrated and use cough drops as needed

## 2022-11-19 NOTE — Assessment & Plan Note (Signed)
Clinically stable.  Running its course. May have secondary bacterial infection May have sinus infection Recently traveled abroad. No red flag signs or symptoms May benefit from daily azithromycin for 5 days.

## 2022-11-19 NOTE — Patient Instructions (Signed)
Start azithromycin Start over-the-counter Mucinex DM Stay well-hydrated Contact the office if no better or worse during the next several days  Upper Respiratory Infection, Adult An upper respiratory infection (URI) affects the nose, throat, and upper airways that lead to the lungs. The most common type of URI is often called the common cold. URIs usually get better on their own, without medical treatment. What are the causes? A URI is caused by a germ (virus). You may catch these germs by: Breathing in droplets from an infected person's cough or sneeze. Touching something that has the germ on it (is contaminated) and then touching your mouth, nose, or eyes. What increases the risk? You are more likely to get a URI if: You are very young or very old. You have close contact with others, such as at work, school, or a health care facility. You smoke. You have long-term (chronic) heart or lung disease. You have a weakened disease-fighting system (immune system). You have nasal allergies or asthma. You have a lot of stress. You have poor nutrition. What are the signs or symptoms? Runny or stuffy (congested) nose. Cough. Sneezing. Sore throat. Headache. Feeling tired (fatigue). Fever. Not wanting to eat as much as usual. Pain in your forehead, behind your eyes, and over your cheekbones (sinus pain). Muscle aches. Redness or irritation of the eyes. Pressure in the ears or face. How is this treated? URIs usually get better on their own within 7-10 days. Medicines cannot cure URIs, but your doctor may recommend certain medicines to help relieve symptoms, such as: Over-the-counter cold medicines. Medicines to reduce coughing (cough suppressants). Coughing is a type of defense against infection that helps to clear the nose, throat, windpipe, and lungs (respiratory system). Take these medicines only as told by your doctor. Medicines to lower your fever. Follow these instructions at  home: Activity Rest as needed. If you have a fever, stay home from work or school until your fever is gone, or until your doctor says you may return to work or school. You should stay home until you cannot spread the infection anymore (you are not contagious). Your doctor may have you wear a face mask so you have less risk of spreading the infection. Relieving symptoms Rinse your mouth often with salt water. To make salt water, dissolve -1 tsp (3-6 g) of salt in 1 cup (237 mL) of warm water. Use a cool-mist humidifier to add moisture to the air. This can help you breathe more easily. Eating and drinking  Drink enough fluid to keep your pee (urine) pale yellow. Eat soups and other clear broths. General instructions  Take over-the-counter and prescription medicines only as told by your doctor. Do not smoke or use any products that contain nicotine or tobacco. If you need help quitting, ask your doctor. Avoid being where people are smoking (avoid secondhand smoke). Stay up to date on all your shots (immunizations), and get the flu shot every year. Keep all follow-up visits. How to prevent the spread of infection to others  Wash your hands with soap and water for at least 20 seconds. If you cannot use soap and water, use hand sanitizer. Avoid touching your mouth, face, eyes, or nose. Cough or sneeze into a tissue or your sleeve or elbow. Do not cough or sneeze into your hand or into the air. Contact a doctor if: You are getting worse, not better. You have any of these: A fever or chills. Brown or red mucus in your nose. Yellow or  brown fluid (discharge)coming from your nose. Pain in your face, especially when you bend forward. Swollen neck glands. Pain when you swallow. White areas in the back of your throat. Get help right away if: You have shortness of breath that gets worse. You have very bad or constant: Headache. Ear pain. Pain in your forehead, behind your eyes, and over  your cheekbones (sinus pain). Chest pain. You have long-lasting (chronic) lung disease along with any of these: Making high-pitched whistling sounds when you breathe, most often when you breathe out (wheezing). Long-lasting cough (more than 14 days). Coughing up blood. A change in your usual mucus. You have a stiff neck. You have changes in your: Vision. Hearing. Thinking. Mood. These symptoms may be an emergency. Get help right away. Call 911. Do not wait to see if the symptoms will go away. Do not drive yourself to the hospital. Summary An upper respiratory infection (URI) is caused by a germ (virus). The most common type of URI is often called the common cold. URIs usually get better within 7-10 days. Take over-the-counter and prescription medicines only as told by your doctor. This information is not intended to replace advice given to you by your health care provider. Make sure you discuss any questions you have with your health care provider. Document Revised: 12/11/2020 Document Reviewed: 12/11/2020 Elsevier Patient Education  2024 [employer].

## 2022-11-30 ENCOUNTER — Ambulatory Visit: Payer: BC Managed Care – PPO | Admitting: Internal Medicine

## 2022-11-30 ENCOUNTER — Encounter: Payer: Self-pay | Admitting: Internal Medicine

## 2022-11-30 VITALS — BP 110/78 | HR 61 | Temp 98.6°F | Ht 69.0 in | Wt 216.0 lb

## 2022-11-30 DIAGNOSIS — D6859 Other primary thrombophilia: Secondary | ICD-10-CM

## 2022-11-30 DIAGNOSIS — E876 Hypokalemia: Secondary | ICD-10-CM

## 2022-11-30 DIAGNOSIS — T502X5A Adverse effect of carbonic-anhydrase inhibitors, benzothiadiazides and other diuretics, initial encounter: Secondary | ICD-10-CM

## 2022-11-30 DIAGNOSIS — I10 Essential (primary) hypertension: Secondary | ICD-10-CM | POA: Diagnosis not present

## 2022-11-30 DIAGNOSIS — I2782 Chronic pulmonary embolism: Secondary | ICD-10-CM

## 2022-11-30 DIAGNOSIS — E785 Hyperlipidemia, unspecified: Secondary | ICD-10-CM | POA: Diagnosis not present

## 2022-11-30 DIAGNOSIS — R0981 Nasal congestion: Secondary | ICD-10-CM

## 2022-11-30 DIAGNOSIS — R051 Acute cough: Secondary | ICD-10-CM

## 2022-11-30 MED ORDER — OLMESARTAN MEDOXOMIL 20 MG PO TABS: 20.0000 mg | ORAL_TABLET | Freq: Every day | ORAL | 2 refills | Status: DC

## 2022-11-30 MED ORDER — FEXOFENADINE HCL 180 MG PO TABS: 180.0000 mg | ORAL_TABLET | Freq: Every day | ORAL | 3 refills | Status: DC

## 2022-11-30 MED ORDER — POTASSIUM CHLORIDE CRYS ER 20 MEQ PO TBCR
20.0000 meq | EXTENDED_RELEASE_TABLET | Freq: Every day | ORAL | 1 refills | Status: DC
Start: 2022-11-30 — End: 2024-02-28

## 2022-11-30 MED ORDER — ROSUVASTATIN CALCIUM 20 MG PO TABS: 20.0000 mg | ORAL_TABLET | Freq: Every day | ORAL | 2 refills | Status: DC

## 2022-11-30 MED ORDER — PSEUDOEPHEDRINE HCL ER 120 MG PO TB12: 120.0000 mg | ORAL_TABLET | Freq: Two times a day (BID) | ORAL | 3 refills | Status: DC | PRN

## 2022-11-30 MED ORDER — METHYLPREDNISOLONE ACETATE 80 MG/ML IJ SUSP
80.0000 mg | Freq: Once | INTRAMUSCULAR | Status: AC
Start: 2022-11-30 — End: 2022-11-30
  Administered 2022-11-30: 80 mg via INTRAMUSCULAR

## 2022-11-30 MED ORDER — CHLORTHALIDONE 25 MG PO TABS: 25.0000 mg | ORAL_TABLET | Freq: Every day | ORAL | 2 refills | Status: DC

## 2022-11-30 MED ORDER — RIVAROXABAN 10 MG PO TABS: 10.0000 mg | ORAL_TABLET | Freq: Every day | ORAL | 2 refills | Status: DC

## 2022-11-30 MED ORDER — FLUTICASONE PROPIONATE 50 MCG/ACT NA SUSP: 2.0000 | Freq: Every day | NASAL | 3 refills | Status: DC

## 2022-11-30 MED ORDER — METHYLPREDNISOLONE 4 MG PO TBPK
ORAL_TABLET | ORAL | 0 refills | Status: DC
Start: 2022-11-30 — End: 2022-12-19

## 2022-11-30 NOTE — Assessment & Plan Note (Signed)
Allergies/ rhinitis

## 2022-11-30 NOTE — Progress Notes (Signed)
Subjective:  Patient ID: Willie Foster, male    DOB: Oct 02, 1965  Age: 57 y.o. MRN: [id number] continue with  CC: Cough (Persistent productive cough a/w wheezing x2 weeks)   HPI Willie Foster presents for allergies, congestion No h/o asthma C/o URI 2 wks ago. Pt took a z pack    Outpatient Medications Prior to Visit  Medication Sig Dispense Refill   acyclovir (ZOVIRAX) 400 MG tablet TAKE 1 TABLET BY MOUTH THREE TIMES DAILY AS NEEDED FOR FEVER BLISTERS 30 tablet 2   Cholecalciferol (VITAMIN D3 PO) Take 1,000 Units by mouth daily.     Multiple Vitamin (MULTIVITAMIN) tablet Take 1 tablet by mouth daily.     NON FORMULARY Potassium, iron, fish oil taken once daily     sildenafil (REVATIO) 20 MG tablet Take 4 tablets (80 mg total) by mouth daily as needed. 60 tablet 3   Turmeric 500 MG CAPS Take 1 tablet by mouth 2 (two) times daily. (Patient taking differently: Take 1 tablet by mouth daily.)     azithromycin (ZITHROMAX) 250 MG tablet Sig as indicated 6 tablet 0   chlorthalidone (HYGROTON) 25 MG tablet Take 1 tablet by mouth once daily 90 tablet 0   EQ ALLERGY RELIEF, CETIRIZINE, 10 MG tablet Take 1 tablet by mouth once daily 90 tablet 1   olmesartan (BENICAR) 20 MG tablet Take 1 tablet by mouth once daily 90 tablet 0   potassium chloride SA (KLOR-CON M) 20 MEQ tablet Take 1 tablet (20 mEq total) by mouth daily. 90 tablet 1   rosuvastatin (CRESTOR) 20 MG tablet Take 1 tablet by mouth once daily 90 tablet 0   XARELTO 10 MG TABS tablet Take 1 tablet by mouth once daily 90 tablet 0   No facility-administered medications prior to visit.    ROS: Review of Systems  Constitutional:  Negative for appetite change, fatigue and unexpected weight change.  HENT:  Negative for congestion, nosebleeds, sneezing, sore throat and trouble swallowing.   Eyes:  Negative for itching and visual disturbance.  Respiratory:  Negative for cough.   Cardiovascular:  Negative for chest pain, palpitations and  leg swelling.  Gastrointestinal:  Negative for abdominal distention, blood in stool, diarrhea and nausea.  Genitourinary:  Negative for frequency and hematuria.  Musculoskeletal:  Negative for back pain, gait problem, joint swelling and neck pain.  Skin:  Negative for rash.  Neurological:  Negative for dizziness, tremors, speech difficulty and weakness.  Psychiatric/Behavioral:  Negative for agitation, dysphoric mood and sleep disturbance. The patient is not nervous/anxious.     Objective:  BP 110/78 (BP Location: Right Arm, Patient Position: Sitting, Cuff Size: Large)   Pulse 61   Temp 98.6 F (37 C) (Oral)   Ht 5\' 9"  (1.753 m)   Wt 216 lb (98 kg)   SpO2 94%   BMI 31.90 kg/m   BP Readings from Last 3 Encounters:  11/30/22 110/78  11/19/22 128/86  10/05/22 134/82    Wt Readings from Last 3 Encounters:  11/30/22 216 lb (98 kg)  11/19/22 216 lb 4 oz (98.1 kg)  10/05/22 217 lb (98.4 kg)    Physical Exam Constitutional:      General: He is not in acute distress.    Appearance: Normal appearance. He is well-developed.     Comments: NAD  Eyes:     Conjunctiva/sclera: Conjunctivae normal.     Pupils: Pupils are equal, round, and reactive to light.  Neck:     Thyroid: No thyromegaly.  Vascular: No JVD.  Cardiovascular:     Rate and Rhythm: Normal rate and regular rhythm.     Heart sounds: Normal heart sounds. No murmur heard.    No friction rub. No gallop.  Pulmonary:     Effort: Pulmonary effort is normal. No respiratory distress.     Breath sounds: Normal breath sounds. No wheezing or rales.  Chest:     Chest wall: No tenderness.  Abdominal:     General: Bowel sounds are normal. There is no distension.     Palpations: Abdomen is soft. There is no mass.     Tenderness: There is no abdominal tenderness. There is no guarding or rebound.  Musculoskeletal:        General: No tenderness. Normal range of motion.     Cervical back: Normal range of motion.   Lymphadenopathy:     Cervical: No cervical adenopathy.  Skin:    General: Skin is warm and dry.     Findings: No rash.  Neurological:     Mental Status: He is alert and oriented to person, place, and time.     Cranial Nerves: No cranial nerve deficit.     Motor: No abnormal muscle tone.     Coordination: Coordination normal.     Gait: Gait normal.     Deep Tendon Reflexes: Reflexes are normal and symmetric.  Psychiatric:        Behavior: Behavior normal.        Thought Content: Thought content normal.        Judgment: Judgment normal.     Lab Results  Component Value Date   WBC 3.8 (L) 03/26/2022   HGB 14.3 03/26/2022   HCT 43.6 03/26/2022   PLT 156.0 03/26/2022   GLUCOSE 96 09/24/2022   CHOL 250 (H) 03/26/2022   TRIG 92.0 03/26/2022   HDL 68.40 03/26/2022   LDLCALC 163 (H) 03/26/2022   ALT 33 09/24/2022   AST 36 09/24/2022   NA 140 09/24/2022   K 3.6 09/24/2022   CL 99 09/24/2022   CREATININE 1.23 09/24/2022   BUN 17 09/24/2022   CO2 33 (H) 09/24/2022   TSH 0.86 03/26/2022   PSA 0.70 03/26/2022   INR 1.6 (H) 03/21/2020   HGBA1C 5.9 (A) 06/29/2022    DG Lumbar Spine Complete  Result Date: 02/15/2019 CLINICAL DATA:  Lower back pain AND rt side leg pain x 4 wks, NKI. EXAM: LUMBAR SPINE - COMPLETE 4+ VIEW COMPARISON:  Lumbar spine radiographs 12/29/2013 FINDINGS: Five non-rib-bearing lumbar-type vertebral bodies. Normal spinal alignment. No evidence of new listhesis. Vertebral body heights are maintained without evidence of fracture. There is moderate disc space loss at L4-5 and L5-S1 with associated endplate sclerosis and minimal anterior spurring. Lower lumbar facet hypertrophy. Nonobstructive bowel gas pattern. The lung bases are clear. IMPRESSION: 1. No acute osseous abnormality. 2. Moderate degenerative disc disease at L4-5 and L5-S1. Electronically Signed   By: Emmaline Kluver M.D.   On: 02/15/2019 13:36    Assessment & Plan:   Problem List Items Addressed  This Visit     Essential hypertension, benign    Continue with olmesartan, chlorthalidone      Relevant Medications   chlorthalidone (HYGROTON) 25 MG tablet   olmesartan (BENICAR) 20 MG tablet   potassium chloride SA (KLOR-CON M) 20 MEQ tablet   rosuvastatin (CRESTOR) 20 MG tablet   rivaroxaban (XARELTO) 10 MG TABS tablet   Hyperlipidemia with target LDL less than 130    Continue  with rosuvastatin      Relevant Medications   chlorthalidone (HYGROTON) 25 MG tablet   olmesartan (BENICAR) 20 MG tablet   rosuvastatin (CRESTOR) 20 MG tablet   rivaroxaban (XARELTO) 10 MG TABS tablet   Diuretic-induced hypokalemia    Monitor potassium      Relevant Medications   potassium chloride SA (KLOR-CON M) 20 MEQ tablet   Pulmonary embolism (HCC)    Continue with Xarelto      Relevant Medications   chlorthalidone (HYGROTON) 25 MG tablet   olmesartan (BENICAR) 20 MG tablet   rosuvastatin (CRESTOR) 20 MG tablet   rivaroxaban (XARELTO) 10 MG TABS tablet   Acute cough    Prednisone Dosepak.  Continue with Allegra, Flonase      Hypercoagulable state, primary (HCC)   Relevant Medications   rivaroxaban (XARELTO) 10 MG TABS tablet   Sinus congestion - Primary    Allergies/ rhinitis          Meds ordered this encounter  Medications   methylPREDNISolone (MEDROL DOSEPAK) 4 MG TBPK tablet    Sig: As directed    Dispense:  21 tablet    Refill:  0   pseudoephedrine (SUDAFED) 120 MG 12 hr tablet    Sig: Take 1 tablet (120 mg total) by mouth 2 (two) times daily as needed for congestion.    Dispense:  60 tablet    Refill:  3   fexofenadine (ALLEGRA) 180 MG tablet    Sig: Take 1 tablet (180 mg total) by mouth daily.    Dispense:  90 tablet    Refill:  3   fluticasone (FLONASE) 50 MCG/ACT nasal spray    Sig: Place 2 sprays into both nostrils daily.    Dispense:  16 g    Refill:  3   chlorthalidone (HYGROTON) 25 MG tablet    Sig: Take 1 tablet (25 mg total) by mouth daily.     Dispense:  90 tablet    Refill:  2   olmesartan (BENICAR) 20 MG tablet    Sig: Take 1 tablet (20 mg total) by mouth daily.    Dispense:  90 tablet    Refill:  2   potassium chloride SA (KLOR-CON M) 20 MEQ tablet    Sig: Take 1 tablet (20 mEq total) by mouth daily.    Dispense:  90 tablet    Refill:  1   rosuvastatin (CRESTOR) 20 MG tablet    Sig: Take 1 tablet (20 mg total) by mouth daily.    Dispense:  90 tablet    Refill:  2   rivaroxaban (XARELTO) 10 MG TABS tablet    Sig: Take 1 tablet (10 mg total) by mouth daily.    Dispense:  90 tablet    Refill:  2   methylPREDNISolone acetate (DEPO-MEDROL) injection 80 mg      Follow-up: No follow-ups on file.  Sonda Primes, MD

## 2022-12-04 ENCOUNTER — Other Ambulatory Visit: Payer: Self-pay | Admitting: Internal Medicine

## 2022-12-13 NOTE — Assessment & Plan Note (Signed)
Continue with Xarelto 

## 2022-12-13 NOTE — Assessment & Plan Note (Signed)
Prednisone Dosepak.  Continue with Allegra, Flonase

## 2022-12-13 NOTE — Assessment & Plan Note (Signed)
Monitor potassium

## 2022-12-13 NOTE — Assessment & Plan Note (Signed)
Continue with rosuvastatin.  

## 2022-12-13 NOTE — Assessment & Plan Note (Signed)
Continue with olmesartan, chlorthalidone

## 2022-12-17 ENCOUNTER — Encounter: Payer: Self-pay | Admitting: Internal Medicine

## 2022-12-17 ENCOUNTER — Ambulatory Visit (INDEPENDENT_AMBULATORY_CARE_PROVIDER_SITE_OTHER): Payer: BC Managed Care – PPO

## 2022-12-17 ENCOUNTER — Ambulatory Visit: Payer: BC Managed Care – PPO | Admitting: Internal Medicine

## 2022-12-17 VITALS — BP 130/78 | HR 64 | Temp 98.3°F | Resp 16 | Ht 69.0 in | Wt 217.0 lb

## 2022-12-17 DIAGNOSIS — R052 Subacute cough: Secondary | ICD-10-CM | POA: Insufficient documentation

## 2022-12-17 DIAGNOSIS — E785 Hyperlipidemia, unspecified: Secondary | ICD-10-CM | POA: Diagnosis not present

## 2022-12-17 DIAGNOSIS — R0989 Other specified symptoms and signs involving the circulatory and respiratory systems: Secondary | ICD-10-CM | POA: Diagnosis not present

## 2022-12-17 DIAGNOSIS — K219 Gastro-esophageal reflux disease without esophagitis: Secondary | ICD-10-CM | POA: Insufficient documentation

## 2022-12-17 DIAGNOSIS — R059 Cough, unspecified: Secondary | ICD-10-CM | POA: Diagnosis not present

## 2022-12-17 MED ORDER — OMEPRAZOLE 40 MG PO CPDR
40.0000 mg | DELAYED_RELEASE_CAPSULE | Freq: Every day | ORAL | 1 refills | Status: DC
Start: 2022-12-17 — End: 2023-01-14

## 2022-12-17 NOTE — Patient Instructions (Signed)
 Gastroesophageal Reflux Disease, Adult    Gastroesophageal reflux (GER) happens when acid from the stomach flows up into the tube that connects the mouth and the stomach (esophagus). Normally, food travels down the esophagus and stays in the stomach to be digested. However, when a person has GER, food and stomach acid sometimes move back up into the esophagus. If this becomes a more serious problem, the person may be diagnosed with a disease called gastroesophageal reflux disease (GERD). GERD occurs when the reflux:  Happens often.  Causes frequent or severe symptoms.  Causes problems such as damage to the esophagus.  When stomach acid comes in contact with the esophagus, the acid may cause inflammation in the esophagus. Over time, GERD may create small holes (ulcers) in the lining of the esophagus.  What are the causes?  This condition is caused by a problem with the muscle between the esophagus and the stomach (lower esophageal sphincter, or LES). Normally, the LES muscle closes after food passes through the esophagus to the stomach. When the LES is weakened or abnormal, it does not close properly, and that allows food and stomach acid to go back up into the esophagus.  The LES can be weakened by certain dietary substances, medicines, and medical conditions, including:  Tobacco use.  Pregnancy.  Having a hiatal hernia.  Alcohol use.  Certain foods and beverages, such as coffee, chocolate, onions, and peppermint.  What increases the risk?  You are more likely to develop this condition if you:  Have an increased body weight.  Have a connective tissue disorder.  Take NSAIDs, such as ibuprofen.  What are the signs or symptoms?  Symptoms of this condition include:  Heartburn.  Difficult or painful swallowing and the feeling of having a lump in the throat.  A bitter taste in the mouth.  Bad breath and having a large amount of saliva.  Having an upset or bloated stomach and belching.  Chest pain. Different conditions can  cause chest pain. Make sure you see your health care provider if you experience chest pain.  Shortness of breath or wheezing.  Ongoing (chronic) cough or a nighttime cough.  Wearing away of tooth enamel.  Weight loss.  How is this diagnosed?  This condition may be diagnosed based on a medical history and a physical exam. To determine if you have mild or severe GERD, your health care provider may also monitor how you respond to treatment. You may also have tests, including:  A test to examine your stomach and esophagus with a small camera (endoscopy).  A test that measures the acidity level in your esophagus.  A test that measures how much pressure is on your esophagus.  A barium swallow or modified barium swallow test to show the shape, size, and functioning of your esophagus.  How is this treated?  Treatment for this condition may vary depending on how severe your symptoms are. Your health care provider may recommend:  Changes to your diet.  Medicine.  Surgery.  The goal of treatment is to help relieve your symptoms and to prevent complications.  Follow these instructions at home:  Eating and drinking    Follow a diet as recommended by your health care provider. This may involve avoiding foods and drinks such as:  Coffee and tea, with or without caffeine.  Drinks that contain alcohol.  Energy drinks and sports drinks.  Carbonated drinks or sodas.  Chocolate and cocoa.  Peppermint and mint flavorings.  Garlic and onions.  Horseradish.  Spicy and acidic foods, including peppers, chili powder, curry powder, vinegar, hot sauces, and barbecue sauce.  Citrus fruit juices and citrus fruits, such as oranges, lemons, and limes.  Tomato-based foods, such as red sauce, chili, salsa, and pizza with red sauce.  Fried and fatty foods, such as donuts, french fries, potato chips, and high-fat dressings.  High-fat meats, such as hot dogs and fatty cuts of red and white meats, such as rib eye steak, sausage, ham, and  bacon.  High-fat dairy items, such as whole milk, butter, and cream cheese.  Eat small, frequent meals instead of large meals.  Avoid drinking large amounts of liquid with your meals.  Avoid eating meals during the 2-3 hours before bedtime.  Avoid lying down right after you eat.  Do not exercise right after you eat.  Lifestyle    Do not use any products that contain nicotine or tobacco. These products include cigarettes, chewing tobacco, and vaping devices, such as e-cigarettes. If you need help quitting, ask your health care provider.  Try to reduce your stress by using methods such as yoga or meditation. If you need help reducing stress, ask your health care provider.  If you are overweight, reduce your weight to an amount that is healthy for you. Ask your health care provider for guidance about a safe weight loss goal.  General instructions  Pay attention to any changes in your symptoms.  Take over-the-counter and prescription medicines only as told by your health care provider. Do not take aspirin, ibuprofen, or other NSAIDs unless your health care provider told you to take these medicines.  Wear loose-fitting clothing. Do not wear anything tight around your waist that causes pressure on your abdomen.  Raise (elevate) the head of your bed about 6 inches (15 cm). You can use a wedge to do this.  Avoid bending over if this makes your symptoms worse.  Keep all follow-up visits. This is important.  Contact a health care provider if:  You have:  New symptoms.  Unexplained weight loss.  Difficulty swallowing or it hurts to swallow.  Wheezing or a persistent cough.  A hoarse voice.  Your symptoms do not improve with treatment.  Get help right away if:  You have sudden pain in your arms, neck, jaw, teeth, or back.  You suddenly feel sweaty, dizzy, or light-headed.  You have chest pain or shortness of breath.  You vomit and the vomit is green, yellow, or black, or it looks like blood or coffee grounds.  You faint.  You  have stool that is red, bloody, or black.  You cannot swallow, drink, or eat.  These symptoms may represent a serious problem that is an emergency. Do not wait to see if the symptoms will go away. Get medical help right away. Call your local emergency services (911 in the [place] Do not drive yourself to the hospital.  Summary  Gastroesophageal reflux happens when acid from the stomach flows up into the esophagus. GERD is a disease in which the reflux happens often, causes frequent or severe symptoms, or causes problems such as damage to the esophagus.  Treatment for this condition may vary depending on how severe your symptoms are. Your health care provider may recommend diet and lifestyle changes, medicine, or surgery.  Contact a health care provider if you have new or worsening symptoms.  Take over-the-counter and prescription medicines only as told by your health care provider. Do not take aspirin, ibuprofen, or other NSAIDs  unless your health care provider told you to do so.  Keep all follow-up visits as told by your health care provider. This is important.  This information is not intended to replace advice given to you by your health care provider. Make sure you discuss any questions you have with your health care provider.  Document Revised: 11/18/2019 Document Reviewed: 11/20/2019  Elsevier Patient Education  2024 [employer].

## 2022-12-17 NOTE — Progress Notes (Signed)
Subjective:  Patient ID: Willie Foster, male    DOB: Jan 15, 1966  Age: 57 y.o. MRN: [id number]  CC: Gastroesophageal Reflux, Hypertension, and Hyperlipidemia   HPI Willie Foster presents for f/up ----  Discussed the use of AI scribe software for clinical note transcription with the patient, who gave verbal consent to proceed.  History of Present Illness   The patient, with a history of recurrent respiratory symptoms, presents with persistent cough and mucus production since May. He reports a daily cough productive of white mucus, with morning congestion in the throat. The symptoms have not significantly improved despite multiple treatments, including cough syrup, a Z-Pak, a steroid shot, and a five-day course of oral steroids. The patient denies any shortness of breath or wheezing during the day and continues to maintain his regular physical activities. However, he reports nocturnal wheezing as per a third party.  The patient also reports a sensation of thick mucus in the throat, which he describes as difficult to swallow at times. He denies any pain or difficulty swallowing, or any sensation of food getting stuck. He has been using a prescribed nasal spray and an allergy pill, which he started after the last doctor's visit. He denies any significant nasal symptoms or postnasal drip sensation.  The patient has noticed a correlation between his symptoms and cutting grass, with the onset of symptoms coinciding with the first time he cut grass this year. He has since been wearing a mask while cutting grass in an attempt to alleviate symptoms.  The patient also reports occasional heartburn, which he previously attributed to his diet. He has been taking an unspecified prescribed medication for heartburn when consuming certain beverages. He denies any previous history of requiring treatment for heartburn.       Outpatient Medications Prior to Visit  Medication Sig Dispense Refill   acyclovir  (ZOVIRAX) 400 MG tablet TAKE 1 TABLET BY MOUTH THREE TIMES DAILY AS NEEDED FOR FEVER BLISTERS 30 tablet 2   chlorthalidone (HYGROTON) 25 MG tablet Take 1 tablet (25 mg total) by mouth daily. 90 tablet 2   Cholecalciferol (VITAMIN D3 PO) Take 1,000 Units by mouth daily.     fexofenadine (ALLEGRA) 180 MG tablet Take 1 tablet (180 mg total) by mouth daily. 90 tablet 3   fluticasone (FLONASE) 50 MCG/ACT nasal spray Place 2 sprays into both nostrils daily. 16 g 3   methylPREDNISolone (MEDROL DOSEPAK) 4 MG TBPK tablet As directed 21 tablet 0   Multiple Vitamin (MULTIVITAMIN) tablet Take 1 tablet by mouth daily.     NON FORMULARY Potassium, iron, fish oil taken once daily     olmesartan (BENICAR) 20 MG tablet Take 1 tablet (20 mg total) by mouth daily. 90 tablet 2   potassium chloride SA (KLOR-CON M) 20 MEQ tablet Take 1 tablet (20 mEq total) by mouth daily. 90 tablet 1   pseudoephedrine (SUDAFED) 120 MG 12 hr tablet Take 1 tablet (120 mg total) by mouth 2 (two) times daily as needed for congestion. 60 tablet 3   rivaroxaban (XARELTO) 10 MG TABS tablet Take 1 tablet (10 mg total) by mouth daily. 90 tablet 2   rosuvastatin (CRESTOR) 20 MG tablet Take 1 tablet (20 mg total) by mouth daily. 90 tablet 2   sildenafil (REVATIO) 20 MG tablet Take 4 tablets (80 mg total) by mouth daily as needed. 60 tablet 3   Turmeric 500 MG CAPS Take 1 tablet by mouth 2 (two) times daily. (Patient taking differently: Take 1 tablet by  mouth daily.)     No facility-administered medications prior to visit.    ROS Review of Systems  Constitutional:  Negative for chills, diaphoresis, fatigue and fever.  HENT:  Positive for postnasal drip. Negative for congestion, nosebleeds, sinus pressure, sneezing, sore throat, trouble swallowing and voice change.   Respiratory:  Positive for cough. Negative for shortness of breath and wheezing.   Cardiovascular:  Negative for chest pain, palpitations and leg swelling.  Gastrointestinal:  Negative.  Negative for abdominal pain, constipation, diarrhea, nausea, rectal pain and vomiting.  Genitourinary: Negative.   Musculoskeletal: Negative.  Negative for arthralgias and myalgias.  Skin: Negative.  Negative for color change and pallor.  Neurological: Negative.  Negative for dizziness and weakness.  Hematological:  Negative for adenopathy. Does not bruise/bleed easily.  Psychiatric/Behavioral: Negative.      Objective:  BP 130/78 (BP Location: Right Arm, Patient Position: Sitting, Cuff Size: Large)   Pulse 64   Temp 98.3 F (36.8 C) (Oral)   Resp 16   Ht 5\' 9"  (1.753 m)   Wt 217 lb (98.4 kg)   SpO2 98%   BMI 32.05 kg/m   BP Readings from Last 3 Encounters:  12/17/22 130/78  11/30/22 110/78  11/19/22 128/86    Wt Readings from Last 3 Encounters:  12/17/22 217 lb (98.4 kg)  11/30/22 216 lb (98 kg)  11/19/22 216 lb 4 oz (98.1 kg)    Physical Exam Vitals reviewed.  Constitutional:      Appearance: Normal appearance.  HENT:     Nose: Nose normal.     Mouth/Throat:     Mouth: Mucous membranes are moist.  Eyes:     General: No scleral icterus.    Conjunctiva/sclera: Conjunctivae normal.  Cardiovascular:     Rate and Rhythm: Normal rate and regular rhythm.     Heart sounds: No murmur heard.    No friction rub. No gallop.  Pulmonary:     Effort: Pulmonary effort is normal.     Breath sounds: No stridor. No wheezing, rhonchi or rales.  Abdominal:     General: Abdomen is flat.     Palpations: There is no mass.     Tenderness: There is no abdominal tenderness. There is no guarding.     Hernia: No hernia is present.  Musculoskeletal:        General: Normal range of motion.     Cervical back: Neck supple.     Right lower leg: No edema.     Left lower leg: No edema.  Skin:    General: Skin is warm and dry.  Neurological:     General: No focal deficit present.  Psychiatric:        Mood and Affect: Mood normal.        Behavior: Behavior normal.      Lab Results  Component Value Date   WBC 3.8 (L) 03/26/2022   HGB 14.3 03/26/2022   HCT 43.6 03/26/2022   PLT 156.0 03/26/2022   GLUCOSE 96 09/24/2022   CHOL 250 (H) 03/26/2022   TRIG 92.0 03/26/2022   HDL 68.40 03/26/2022   LDLCALC 163 (H) 03/26/2022   ALT 33 09/24/2022   AST 36 09/24/2022   NA 140 09/24/2022   K 3.6 09/24/2022   CL 99 09/24/2022   CREATININE 1.23 09/24/2022   BUN 17 09/24/2022   CO2 33 (H) 09/24/2022   TSH 0.86 03/26/2022   PSA 0.70 03/26/2022   INR 1.6 (H) 03/21/2020   HGBA1C 5.9 (  A) 06/29/2022    DG Lumbar Spine Complete  Result Date: 02/15/2019 CLINICAL DATA:  Lower back pain AND rt side leg pain x 4 wks, NKI. EXAM: LUMBAR SPINE - COMPLETE 4+ VIEW COMPARISON:  Lumbar spine radiographs 12/29/2013 FINDINGS: Five non-rib-bearing lumbar-type vertebral bodies. Normal spinal alignment. No evidence of new listhesis. Vertebral body heights are maintained without evidence of fracture. There is moderate disc space loss at L4-5 and L5-S1 with associated endplate sclerosis and minimal anterior spurring. Lower lumbar facet hypertrophy. Nonobstructive bowel gas pattern. The lung bases are clear. IMPRESSION: 1. No acute osseous abnormality. 2. Moderate degenerative disc disease at L4-5 and L5-S1. Electronically Signed   By: Emmaline Kluver M.D.   On: 02/15/2019 13:36   DG Chest 2 View  Result Date: 12/17/2022 CLINICAL DATA:  Cough and congestion for 2 months. EXAM: CHEST - 2 VIEW COMPARISON:  Chest radiographs 01/12/2017 FINDINGS: Cardiac silhouette is at the upper limits of normal size, unchanged. Mediastinal contours are within limits. The lungs are clear. No pleural effusion or pneumothorax. No acute skeletal abnormality. IMPRESSION: No active cardiopulmonary disease. Electronically Signed   By: Neita Garnet M.D.   On: 12/17/2022 14:55     Assessment & Plan:   Subacute cough- Chest x-ray is negative for mass or infiltrate. -     DG Chest 2 View; Future  LPRD  (laryngopharyngeal reflux disease)- Will treat with a PPI. -     Omeprazole; Take 1 capsule (40 mg total) by mouth daily.  Dispense: 90 capsule; Refill: 1  Hyperlipidemia with target LDL less than 130 -     CT CARDIAC SCORING ([facility] LOCATIONS ONLY); Future     Follow-up: Return in about 4 months (around 04/19/2023).  Sanda Linger, MD

## 2023-01-13 ENCOUNTER — Ambulatory Visit
Admission: RE | Admit: 2023-01-13 | Discharge: 2023-01-13 | Disposition: A | Discharge status: Home / Self Care | Payer: No Typology Code available for payment source | Source: Ambulatory Visit | Attending: Internal Medicine | Admitting: Internal Medicine

## 2023-01-13 ENCOUNTER — Telehealth: Payer: Self-pay | Admitting: Internal Medicine

## 2023-01-13 DIAGNOSIS — E785 Hyperlipidemia, unspecified: Secondary | ICD-10-CM

## 2023-01-13 NOTE — Telephone Encounter (Signed)
Per result note:  Willie Foster ----   Your coronary calcium score is zero. This is very good news.   Sanda Linger, MD  Pt has been informed and expressed understanding.    Pt also wanted to mentioned that when he first started taking the Omeprazole in the morning it was working however over the last 3-4 days he has been feeling the reflux sensation coming back. Pt is looking for recommendations. Please advise.

## 2023-01-13 NOTE — Telephone Encounter (Signed)
Patient would like for someone to call him when Dr. Yetta Barre goes over the CT Calcium Scoring test.  He had it done this morning.  Phone:  [phone]

## 2023-01-14 ENCOUNTER — Other Ambulatory Visit: Payer: Self-pay | Admitting: Internal Medicine

## 2023-01-14 DIAGNOSIS — K219 Gastro-esophageal reflux disease without esophagitis: Secondary | ICD-10-CM

## 2023-01-14 MED ORDER — OMEPRAZOLE 40 MG PO CPDR
40.0000 mg | DELAYED_RELEASE_CAPSULE | Freq: Two times a day (BID) | ORAL | 0 refills | Status: DC
Start: 2023-01-14 — End: 2023-06-22

## 2023-01-14 NOTE — Telephone Encounter (Signed)
Spoke with pt and he understands taking Omeprazole twice a day should help and will keep [place] updated if anything changes.

## 2023-02-10 NOTE — Progress Notes (Unsigned)
[facility] [address] [place] [address] Phone: [phone] Subjective:   Willie Foster, am serving as a scribe for Dr. Antoine Primas.  I'm seeing this patient by the request  of:  Etta Grandchild, MD  CC: Left shoulder pain  [id number]  09/03/2021 Tendinitis noted, discussed icing regimen and home exercise, discussed which activities to do and which ones to avoid.  Discussed limiting range of motion.  Patient continues to take the anticoagulant so is not a candidate for ibuprofen but could do topical.  Patient should respond relatively well though to home exercises hopefully.  Discussed icing regimen and home exercises.  Follow-up again in 6 to 8 weeks.   Updated 02/11/2023 Willie Foster is a 57 y.o. male coming in with complaint of L shoulder pain. Patient is still doing CrossFit. Trying to prevent future injury. Pain over AC joint. Pain will pull ups.    Left shoulder x-rays taken in 2022 were unremarkable.    Past Medical History:  Diagnosis Date   Elevated serum creatinine 06/15/2016   1.66 06/02/16   Fatty liver disease, nonalcoholic 2011   Hyperlipidemia    Hypertension    Leukopenia 01/18/2013   WBC 3,400 01/18/13 was normal 8/22 Xarelto?   Pulmonary embolism (HCC) 10/2012   Right lung   No past surgical history on file. Social History   Socioeconomic History   Marital status: Single    Spouse name: Not on file   Number of children: 0   Years of education: Not on file   Highest education level: Not on file  Occupational History   Occupation: Software engineer    Employer: [employer]  Tobacco Use   Smoking status: Never   Smokeless tobacco: Never  Substance and Sexual Activity   Alcohol use: Yes    Alcohol/week: 0.0 standard drinks of alcohol    Comment: occasional-twice a week   Drug use: No   Sexual activity: Not on file  Other Topics Concern   Not on file  Social History Narrative   Not on file    Social Determinants of Health   Financial Resource Strain: Not on file  Food Insecurity: Not on file  Transportation Needs: Not on file  Physical Activity: Not on file  Stress: Not on file  Social Connections: Not on file   Allergies  Allergen Reactions   Lipitor [Atorvastatin]     Elevated LFT's   Family History  Problem Relation Age of Onset   Hypertension Father    Alcohol abuse Neg Hx    COPD Neg Hx    Diabetes Neg Hx    Drug abuse Neg Hx    Early death Neg Hx    Heart disease Neg Hx    Hyperlipidemia Neg Hx    Kidney disease Neg Hx    Stroke Neg Hx    Lung cancer Father    Breast cancer Mother        mets to brain     Current Outpatient Medications (Cardiovascular):    chlorthalidone (HYGROTON) 25 MG tablet, Take 1 tablet (25 mg total) by mouth daily.   olmesartan (BENICAR) 20 MG tablet, Take 1 tablet (20 mg total) by mouth daily.   rosuvastatin (CRESTOR) 20 MG tablet, Take 1 tablet (20 mg total) by mouth daily.   sildenafil (REVATIO) 20 MG tablet, Take 4 tablets (80 mg total) by mouth daily as needed.  Current Outpatient Medications (Respiratory):    fexofenadine (ALLEGRA)  180 MG tablet, Take 1 tablet (180 mg total) by mouth daily.   fluticasone (FLONASE) 50 MCG/ACT nasal spray, Place 2 sprays into both nostrils daily.   Current Outpatient Medications (Hematological):    rivaroxaban (XARELTO) 10 MG TABS tablet, Take 1 tablet (10 mg total) by mouth daily.  Current Outpatient Medications (Other):    acyclovir (ZOVIRAX) 400 MG tablet, TAKE 1 TABLET BY MOUTH THREE TIMES DAILY AS NEEDED FOR FEVER BLISTERS   Cholecalciferol (VITAMIN D3 PO), Take 1,000 Units by mouth daily.   Multiple Vitamin (MULTIVITAMIN) tablet, Take 1 tablet by mouth daily.   NON FORMULARY, Potassium, iron, fish oil taken once daily   omeprazole (PRILOSEC) 40 MG capsule, Take 1 capsule (40 mg total) by mouth 2 (two) times daily.   potassium chloride SA (KLOR-CON M) 20 MEQ tablet, Take 1  tablet (20 mEq total) by mouth daily.   Turmeric 500 MG CAPS, Take 1 tablet by mouth 2 (two) times daily. (Patient taking differently: Take 1 tablet by mouth daily.)   Reviewed prior external information including notes and imaging from  primary care provider As well as notes that were available from care everywhere and other healthcare systems.  Past medical history, social, surgical and family history all reviewed in electronic medical record.  No pertanent information unless stated regarding to the chief complaint.   Review of Systems:  No headache, visual changes, nausea, vomiting, diarrhea, constipation, dizziness, abdominal pain, skin rash, fevers, chills, night sweats, weight loss, swollen lymph nodes, body aches, joint swelling, chest pain, shortness of breath, mood changes. POSITIVE muscle aches  Objective  There were no vitals taken for this visit.   General: No apparent distress alert and oriented x3 mood and affect normal, dressed appropriately.  HEENT: Pupils equal, extraocular movements intact  Respiratory: Patient's speak in full sentences and does not appear short of breath  Cardiovascular: No lower extremity edema, non tender, no erythema  Left shoulder exam shows patient does have tenderness to palpation of the acromioclavicular joint.  Positive crossover sign noted.  Patient has negative O'Brien's.  5 out of 5 strength with empty can  Limited muscular skeletal ultrasound was performed and interpreted by Antoine Primas, M  Limited ultrasound shows significant hypoechoic changes of the acromioclavicular joint.  Patient does have narrowing of the acromioclavicular joint.  Supraspinatus tendon does have what appears to be a cyst within the tendon but no increase in Doppler flow or neovascularization.  No retraction noted. Impression: AC arthropathy  Procedure: Real-time Ultrasound Guided Injection of left acromioclavicular joint Device: GE Logiq Q7 Ultrasound guided  injection is preferred based studies that show increased duration, increased effect, greater accuracy, decreased procedural pain, increased response rate, and decreased cost with ultrasound guided versus blind injection.  Verbal informed consent obtained.  Time-out conducted.  Noted no overlying erythema, induration, or other signs of local infection.  Skin prepped in a sterile fashion.  Local anesthesia: Topical Ethyl chloride.  With sterile technique and under real time ultrasound guidance: With a 25-gauge half inch needle injected with 0.5 cc of 0.5% Marcaine and 0.5 cc of Kenalog 40 mg/mL Completed without difficulty  Advised to call if fevers/chills, erythema, induration, drainage, or persistent bleeding.  Impression: Technically successful ultrasound guided injection.    Impression and Recommendations:     The above documentation has been reviewed and is accurate and complete Judi Saa, DO

## 2023-02-11 ENCOUNTER — Encounter: Payer: Self-pay | Admitting: Family Medicine

## 2023-02-11 ENCOUNTER — Other Ambulatory Visit: Payer: Self-pay

## 2023-02-11 ENCOUNTER — Ambulatory Visit: Payer: BC Managed Care – PPO | Admitting: Family Medicine

## 2023-02-11 VITALS — BP 128/88 | HR 57 | Ht 69.0 in | Wt 213.0 lb

## 2023-02-11 DIAGNOSIS — M19012 Primary osteoarthritis, left shoulder: Secondary | ICD-10-CM | POA: Diagnosis not present

## 2023-02-11 DIAGNOSIS — M19019 Primary osteoarthritis, unspecified shoulder: Secondary | ICD-10-CM | POA: Insufficient documentation

## 2023-02-11 DIAGNOSIS — M25512 Pain in left shoulder: Secondary | ICD-10-CM | POA: Diagnosis not present

## 2023-02-11 NOTE — Assessment & Plan Note (Signed)
Patient given injection and tolerated the procedure well, discussed icing regimen and home exercises, discussed avoiding certain activities.  Patient encouraged to keep hands within peripheral vision.  Discussed avoiding heavy lifting above head if possible and follow-up again in 2 months

## 2023-02-11 NOTE — Patient Instructions (Addendum)
Injection in the shoulder today Do prescribed exercises at least 3x a week Keep hands in peripheral vision [facility] See you again in 2 months

## 2023-03-16 ENCOUNTER — Other Ambulatory Visit: Payer: Self-pay | Admitting: Internal Medicine

## 2023-03-16 DIAGNOSIS — N522 Drug-induced erectile dysfunction: Secondary | ICD-10-CM

## 2023-03-23 ENCOUNTER — Encounter: Payer: Self-pay | Admitting: Gastroenterology

## 2023-03-30 ENCOUNTER — Encounter: Payer: BC Managed Care – PPO | Admitting: Internal Medicine

## 2023-03-30 ENCOUNTER — Ambulatory Visit: Payer: BC Managed Care – PPO | Admitting: Internal Medicine

## 2023-04-02 ENCOUNTER — Telehealth: Payer: Self-pay

## 2023-04-02 ENCOUNTER — Other Ambulatory Visit (HOSPITAL_COMMUNITY): Payer: Self-pay

## 2023-04-02 NOTE — Telephone Encounter (Signed)
Pharmacy Patient Advocate Encounter   Received notification from Patient Pharmacy that prior authorization for Sildenafil 20mg  tabs is required/requested.   Insurance verification completed.   The patient is insured through [facility] .   Per test claim: PA required; PA started via CoverMyMeds. KEY [id number] . Waiting for clinical questions to populate.

## 2023-04-05 ENCOUNTER — Other Ambulatory Visit (HOSPITAL_COMMUNITY): Payer: Self-pay

## 2023-04-05 NOTE — Telephone Encounter (Signed)
Pharmacy Patient Advocate Encounter  Received notification from [facility] that Prior Authorization for SILDENAFIL 20MG  has been DENIED.  Full denial letter will be uploaded to the media tab. See denial reason below.   PA #/Case ID/Reference #: [id number]

## 2023-04-05 NOTE — Telephone Encounter (Signed)
 Clinical questions answered and PA submitted

## 2023-04-12 NOTE — Telephone Encounter (Signed)
Want me to let him know his medication was denied or are we going to try other routes ?

## 2023-04-13 NOTE — Telephone Encounter (Signed)
Patient wants to know if there is anything else he can try ?

## 2023-04-14 NOTE — Progress Notes (Deleted)
[facility] [address] [place] [address] Phone: [phone] Subjective:    I'm seeing this patient by the request  of:  Etta Grandchild, MD  CC:   [id number]  02/11/2023 Patient given injection and tolerated the procedure well, discussed icing regimen and home exercises, discussed avoiding certain activities. Patient encouraged to keep hands within peripheral vision. Discussed avoiding heavy lifting above head if possible and follow-up again in 2 months    Update 04/15/2023 Delsin Alires is a 57 y.o. male coming in with complaint of L shoulder pain. Patient states        Past Medical History:  Diagnosis Date   Elevated serum creatinine 06/15/2016   1.66 06/02/16   Fatty liver disease, nonalcoholic 2011   Hyperlipidemia    Hypertension    Leukopenia 01/18/2013   WBC 3,400 01/18/13 was normal 8/22 Xarelto?   Pulmonary embolism (HCC) 10/2012   Right lung   No past surgical history on file. Social History   Socioeconomic History   Marital status: Single    Spouse name: Not on file   Number of children: 0   Years of education: Not on file   Highest education level: Not on file  Occupational History   Occupation: Software engineer    Employer: [employer]  Tobacco Use   Smoking status: Never   Smokeless tobacco: Never  Substance and Sexual Activity   Alcohol use: Yes    Alcohol/week: 0.0 standard drinks of alcohol    Comment: occasional-twice a week   Drug use: No   Sexual activity: Not on file  Other Topics Concern   Not on file  Social History Narrative   Not on file   Social Determinants of Health   Financial Resource Strain: Not on file  Food Insecurity: Not on file  Transportation Needs: Not on file  Physical Activity: Not on file  Stress: Not on file  Social Connections: Not on file   Allergies  Allergen Reactions   Lipitor [Atorvastatin]     Elevated LFT's   Family History  Problem Relation Age of  Onset   Hypertension Father    Alcohol abuse Neg Hx    COPD Neg Hx    Diabetes Neg Hx    Drug abuse Neg Hx    Early death Neg Hx    Heart disease Neg Hx    Hyperlipidemia Neg Hx    Kidney disease Neg Hx    Stroke Neg Hx    Lung cancer Father    Breast cancer Mother        mets to brain     Current Outpatient Medications (Cardiovascular):    chlorthalidone (HYGROTON) 25 MG tablet, Take 1 tablet (25 mg total) by mouth daily.   olmesartan (BENICAR) 20 MG tablet, Take 1 tablet (20 mg total) by mouth daily.   rosuvastatin (CRESTOR) 20 MG tablet, Take 1 tablet (20 mg total) by mouth daily.   sildenafil (REVATIO) 20 MG tablet, TAKE 4 TABLETS BY MOUTH ONCE DAILY AS NEEDED  Current Outpatient Medications (Respiratory):    fexofenadine (ALLEGRA) 180 MG tablet, Take 1 tablet (180 mg total) by mouth daily.   fluticasone (FLONASE) 50 MCG/ACT nasal spray, Place 2 sprays into both nostrils daily.   Current Outpatient Medications (Hematological):    rivaroxaban (XARELTO) 10 MG TABS tablet, Take 1 tablet (10 mg total) by mouth daily.  Current Outpatient Medications (Other):    acyclovir (ZOVIRAX) 400 MG tablet, TAKE 1  TABLET BY MOUTH THREE TIMES DAILY AS NEEDED FOR FEVER BLISTERS   Cholecalciferol (VITAMIN D3 PO), Take 1,000 Units by mouth daily.   Multiple Vitamin (MULTIVITAMIN) tablet, Take 1 tablet by mouth daily.   NON FORMULARY, Potassium, iron, fish oil taken once daily   omeprazole (PRILOSEC) 40 MG capsule, Take 1 capsule (40 mg total) by mouth 2 (two) times daily.   potassium chloride SA (KLOR-CON M) 20 MEQ tablet, Take 1 tablet (20 mEq total) by mouth daily.   Turmeric 500 MG CAPS, Take 1 tablet by mouth 2 (two) times daily. (Patient taking differently: Take 1 tablet by mouth daily.)   Reviewed prior external information including notes and imaging from  primary care provider As well as notes that were available from care everywhere and other healthcare systems.  Past medical  history, social, surgical and family history all reviewed in electronic medical record.  No pertanent information unless stated regarding to the chief complaint.   Review of Systems:  No headache, visual changes, nausea, vomiting, diarrhea, constipation, dizziness, abdominal pain, skin rash, fevers, chills, night sweats, weight loss, swollen lymph nodes, body aches, joint swelling, chest pain, shortness of breath, mood changes. POSITIVE muscle aches  Objective  There were no vitals taken for this visit.   General: No apparent distress alert and oriented x3 mood and affect normal, dressed appropriately.  HEENT: Pupils equal, extraocular movements intact  Respiratory: Patient's speak in full sentences and does not appear short of breath  Cardiovascular: No lower extremity edema, non tender, no erythema      Impression and Recommendations:

## 2023-04-15 ENCOUNTER — Ambulatory Visit: Payer: BC Managed Care – PPO | Admitting: Family Medicine

## 2023-04-16 ENCOUNTER — Other Ambulatory Visit: Payer: Self-pay | Admitting: Internal Medicine

## 2023-04-26 ENCOUNTER — Ambulatory Visit (INDEPENDENT_AMBULATORY_CARE_PROVIDER_SITE_OTHER): Payer: BC Managed Care – PPO | Admitting: Internal Medicine

## 2023-04-26 ENCOUNTER — Encounter: Payer: Self-pay | Admitting: Internal Medicine

## 2023-04-26 VITALS — BP 138/88 | HR 56 | Temp 97.9°F | Resp 16 | Ht 69.0 in | Wt 210.8 lb

## 2023-04-26 DIAGNOSIS — R739 Hyperglycemia, unspecified: Secondary | ICD-10-CM

## 2023-04-26 DIAGNOSIS — I10 Essential (primary) hypertension: Secondary | ICD-10-CM | POA: Diagnosis not present

## 2023-04-26 DIAGNOSIS — K7581 Nonalcoholic steatohepatitis (NASH): Secondary | ICD-10-CM

## 2023-04-26 DIAGNOSIS — E785 Hyperlipidemia, unspecified: Secondary | ICD-10-CM | POA: Diagnosis not present

## 2023-04-26 DIAGNOSIS — R001 Bradycardia, unspecified: Secondary | ICD-10-CM | POA: Diagnosis not present

## 2023-04-26 DIAGNOSIS — Z23 Encounter for immunization: Secondary | ICD-10-CM | POA: Diagnosis not present

## 2023-04-26 DIAGNOSIS — Z0000 Encounter for general adult medical examination without abnormal findings: Secondary | ICD-10-CM | POA: Diagnosis not present

## 2023-04-26 DIAGNOSIS — Z0001 Encounter for general adult medical examination with abnormal findings: Secondary | ICD-10-CM

## 2023-04-26 LAB — CBC WITH DIFFERENTIAL/PLATELET
Basophils Absolute: 0 10*3/uL (ref 0.0–0.1)
Basophils Relative: 0.5 % (ref 0.0–3.0)
Eosinophils Absolute: 0.1 10*3/uL (ref 0.0–0.7)
Eosinophils Relative: 1.2 % (ref 0.0–5.0)
HCT: 44.2 % (ref 39.0–52.0)
Hemoglobin: 14.5 g/dL (ref 13.0–17.0)
Lymphocytes Relative: 25.8 % (ref 12.0–46.0)
Lymphs Abs: 1.3 10*3/uL (ref 0.7–4.0)
MCHC: 32.9 g/dL (ref 30.0–36.0)
MCV: 80.2 fL (ref 78.0–100.0)
Monocytes Absolute: 0.4 10*3/uL (ref 0.1–1.0)
Monocytes Relative: 7.5 % (ref 3.0–12.0)
Neutro Abs: 3.2 10*3/uL (ref 1.4–7.7)
Neutrophils Relative %: 65 % (ref 43.0–77.0)
Platelets: 194 10*3/uL (ref 150.0–400.0)
RBC: 5.51 Mil/uL (ref 4.22–5.81)
RDW: 14 % (ref 11.5–15.5)
WBC: 4.9 10*3/uL (ref 4.0–10.5)

## 2023-04-26 LAB — URINALYSIS, ROUTINE W REFLEX MICROSCOPIC
Bilirubin Urine: NEGATIVE
Hgb urine dipstick: NEGATIVE
Ketones, ur: NEGATIVE
Leukocytes,Ua: NEGATIVE
Nitrite: NEGATIVE
RBC / HPF: NONE SEEN (ref 0–?)
Specific Gravity, Urine: 1.015 (ref 1.000–1.030)
Total Protein, Urine: 30 — AB
Urine Glucose: NEGATIVE
Urobilinogen, UA: 0.2 (ref 0.0–1.0)
pH: 7 (ref 5.0–8.0)

## 2023-04-26 LAB — LIPID PANEL
Cholesterol: 235 mg/dL — ABNORMAL HIGH (ref 0–200)
HDL: 59.7 mg/dL (ref >39.00)
LDL Cholesterol: 137 mg/dL — ABNORMAL HIGH (ref 0–99)
NonHDL: 175
Total CHOL/HDL Ratio: 4
Triglycerides: 192 mg/dL — ABNORMAL HIGH (ref 0.0–149.0)
VLDL: 38.4 mg/dL (ref 0.0–40.0)

## 2023-04-26 LAB — HEPATIC FUNCTION PANEL
ALT: 39 U/L (ref 0–53)
AST: 35 U/L (ref 0–37)
Albumin: 4.7 g/dL (ref 3.5–5.2)
Alkaline Phosphatase: 71 U/L (ref 39–117)
Bilirubin, Direct: 0.1 mg/dL (ref 0.0–0.3)
Total Bilirubin: 0.6 mg/dL (ref 0.2–1.2)
Total Protein: 7.4 g/dL (ref 6.0–8.3)

## 2023-04-26 LAB — BASIC METABOLIC PANEL
BUN: 17 mg/dL (ref 6–23)
CO2: 32 meq/L (ref 19–32)
Calcium: 10.2 mg/dL (ref 8.4–10.5)
Chloride: 100 meq/L (ref 96–112)
Creatinine, Ser: 1.16 mg/dL (ref 0.40–1.50)
GFR: 69.03 mL/min (ref >60.00)
Glucose, Bld: 98 mg/dL (ref 70–99)
Potassium: 3.9 meq/L (ref 3.5–5.1)
Sodium: 141 meq/L (ref 135–145)

## 2023-04-26 LAB — TSH: TSH: 1.36 u[IU]/mL (ref 0.35–5.50)

## 2023-04-26 LAB — HEMOGLOBIN A1C: Hgb A1c MFr Bld: 6.2 % (ref 4.6–6.5)

## 2023-04-26 LAB — PROTIME-INR
INR: 1.9 {ratio} — ABNORMAL HIGH (ref 0.8–1.0)
Prothrombin Time: 19.1 s — ABNORMAL HIGH (ref 9.6–13.1)

## 2023-04-26 LAB — PSA: PSA: 1.08 ng/mL (ref 0.10–4.00)

## 2023-04-26 NOTE — Patient Instructions (Signed)
Health Maintenance, Male Adopting a healthy lifestyle and getting preventive care are important in promoting health and wellness. Ask your health care provider about: The right schedule for you to have regular tests and exams. Things you can do on your own to prevent diseases and keep yourself healthy. What should I know about diet, weight, and exercise? Eat a healthy diet  Eat a diet that includes plenty of vegetables, fruits, low-fat dairy products, and lean protein. Do not eat a lot of foods that are high in solid fats, added sugars, or sodium. Maintain a healthy weight Body mass index (BMI) is a measurement that can be used to identify possible weight problems. It estimates body fat based on height and weight. Your health care provider can help determine your BMI and help you achieve or maintain a healthy weight. Get regular exercise Get regular exercise. This is one of the most important things you can do for your health. Most adults should: Exercise for at least 150 minutes each week. The exercise should increase your heart rate and make you sweat (moderate-intensity exercise). Do strengthening exercises at least twice a week. This is in addition to the moderate-intensity exercise. Spend less time sitting. Even light physical activity can be beneficial. Watch cholesterol and blood lipids Have your blood tested for lipids and cholesterol at 59 years of age, then have this test every 5 years. You may need to have your cholesterol levels checked more often if: Your lipid or cholesterol levels are high. You are older than 59 years of age. You are at high risk for heart disease. What should I know about cancer screening? Many types of cancers can be detected early and may often be prevented. Depending on your health history and family history, you may need to have cancer screening at various ages. This may include screening for: Colorectal cancer. Prostate cancer. Skin cancer. Lung  cancer. What should I know about heart disease, diabetes, and high blood pressure? Blood pressure and heart disease High blood pressure causes heart disease and increases the risk of stroke. This is more likely to develop in people who have high blood pressure readings or are overweight. Talk with your health care provider about your target blood pressure readings. Have your blood pressure checked: Every 3-5 years if you are 18-39 years of age. Every year if you are 40 years old or older. If you are between the ages of 65 and 75 and are a current or former smoker, ask your health care provider if you should have a one-time screening for abdominal aortic aneurysm (AAA). Diabetes Have regular diabetes screenings. This checks your fasting blood sugar level. Have the screening done: Once every three years after age 45 if you are at a normal weight and have a low risk for diabetes. More often and at a younger age if you are overweight or have a high risk for diabetes. What should I know about preventing infection? Hepatitis B If you have a higher risk for hepatitis B, you should be screened for this virus. Talk with your health care provider to find out if you are at risk for hepatitis B infection. Hepatitis C Blood testing is recommended for: Everyone born from 1945 through 1965. Anyone with known risk factors for hepatitis C. Sexually transmitted infections (STIs) You should be screened each year for STIs, including gonorrhea and chlamydia, if: You are sexually active and are younger than 59 years of age. You are older than 59 years of age and your   health care provider tells you that you are at risk for this type of infection. Your sexual activity has changed since you were last screened, and you are at increased risk for chlamydia or gonorrhea. Ask your health care provider if you are at risk. Ask your health care provider about whether you are at high risk for HIV. Your health care provider  may recommend a prescription medicine to help prevent HIV infection. If you choose to take medicine to prevent HIV, you should first get tested for HIV. You should then be tested every 3 months for as long as you are taking the medicine. Follow these instructions at home: Alcohol use Do not drink alcohol if your health care provider tells you not to drink. If you drink alcohol: Limit how much you have to 0-2 drinks a day. Know how much alcohol is in your drink. In the [place] one drink equals one 12 oz bottle of beer (355 mL), one 5 oz glass of wine (148 mL), or one 1 oz glass of hard liquor (44 mL). Lifestyle Do not use any products that contain nicotine or tobacco. These products include cigarettes, chewing tobacco, and vaping devices, such as e-cigarettes. If you need help quitting, ask your health care provider. Do not use street drugs. Do not share needles. Ask your health care provider for help if you need support or information about quitting drugs. General instructions Schedule regular health, dental, and eye exams. Stay current with your vaccines. Tell your health care provider if: You often feel depressed. You have ever been abused or do not feel safe at home. Summary Adopting a healthy lifestyle and getting preventive care are important in promoting health and wellness. Follow your health care provider's instructions about healthy diet, exercising, and getting tested or screened for diseases. Follow your health care provider's instructions on monitoring your cholesterol and blood pressure. This information is not intended to replace advice given to you by your health care provider. Make sure you discuss any questions you have with your health care provider. Document Revised: 09/30/2020 Document Reviewed: 09/30/2020 Elsevier Patient Education  2024 Elsevier Inc.  

## 2023-04-26 NOTE — Progress Notes (Unsigned)
Subjective:  Patient ID: Willie Foster, male    DOB: [birth year]  Age: 57 y.o. MRN: [id number]  CC: Annual Exam, Hypertension, and Hyperlipidemia   HPI Willie Foster presents for a CPX and f/up ---   Discussed the use of AI scribe software for clinical note transcription with the patient, who gave verbal consent to proceed.  History of Present Illness   The patient presents for a routine check-up, reporting feeling "great" with no complaints of headache, blurred vision, chest pain, or shortness of breath. He maintains an active lifestyle, as evidenced by participation in a morning spin class without any issues.  The patient's heart rate, which is typically in the fifties, does not cause any symptoms of weakness, dizziness, or lightheadedness. He has been managing well with his current health status and medication regimen.       Outpatient Medications Prior to Visit  Medication Sig Dispense Refill   acyclovir (ZOVIRAX) 400 MG tablet TAKE 1 TABLET BY MOUTH THREE TIMES DAILY AS NEEDED FOR FEVER BLISTERS 30 tablet 2   chlorthalidone (HYGROTON) 25 MG tablet Take 1 tablet (25 mg total) by mouth daily. 90 tablet 2   Cholecalciferol (VITAMIN D3 PO) Take 1,000 Units by mouth daily.     fexofenadine (ALLEGRA) 180 MG tablet Take 1 tablet (180 mg total) by mouth daily. 90 tablet 3   fluticasone (FLONASE) 50 MCG/ACT nasal spray Place 2 sprays into both nostrils daily. (Patient taking differently: Place 2 sprays into both nostrils as needed for allergies.) 16 g 3   Multiple Vitamin (MULTIVITAMIN) tablet Take 1 tablet by mouth daily.     NON FORMULARY Potassium, iron, fish oil taken once daily     olmesartan (BENICAR) 20 MG tablet Take 1 tablet (20 mg total) by mouth daily. 90 tablet 2   omeprazole (PRILOSEC) 40 MG capsule Take 1 capsule (40 mg total) by mouth 2 (two) times daily. (Patient taking differently: Take 40 mg by mouth daily.) 180 capsule 0   potassium chloride SA (KLOR-CON M) 20 MEQ tablet  Take 1 tablet (20 mEq total) by mouth daily. 90 tablet 1   rivaroxaban (XARELTO) 10 MG TABS tablet Take 1 tablet (10 mg total) by mouth daily. 90 tablet 2   rosuvastatin (CRESTOR) 20 MG tablet Take 1 tablet (20 mg total) by mouth daily. 90 tablet 2   sildenafil (REVATIO) 20 MG tablet TAKE 4 TABLETS BY MOUTH ONCE DAILY AS NEEDED 60 tablet 0   Turmeric 500 MG CAPS Take 1 tablet by mouth 2 (two) times daily. (Patient taking differently: Take 1 tablet by mouth daily.)     No facility-administered medications prior to visit.    ROS Review of Systems  Objective:  BP 138/88 (BP Location: Left Arm, Patient Position: Sitting, Cuff Size: Normal)   Pulse (!) 56   Temp 97.9 F (36.6 C) (Oral)   Resp 16   Ht 5\' 9"  (1.753 m)   Wt 210 lb 12.8 oz (95.6 kg)   SpO2 96%   BMI 31.13 kg/m   BP Readings from Last 3 Encounters:  04/26/23 138/88  02/11/23 128/88  12/17/22 130/78    Wt Readings from Last 3 Encounters:  04/26/23 210 lb 12.8 oz (95.6 kg)  02/11/23 213 lb (96.6 kg)  12/17/22 217 lb (98.4 kg)    Physical Exam Vitals reviewed. Exam conducted with a chaperone present.  Constitutional:      Appearance: Normal appearance.  HENT:     Nose: Nose normal.  Mouth/Throat:     Mouth: Mucous membranes are moist.  Eyes:     General: No scleral icterus.    Conjunctiva/sclera: Conjunctivae normal.  Cardiovascular:     Rate and Rhythm: Regular rhythm. Bradycardia present.     Heart sounds: Normal heart sounds, S1 normal and S2 normal. No murmur heard.    Comments: EKG--- SB, 59 bpm NS T wave changes No LVH or Q waves Unchanged Pulmonary:     Effort: Pulmonary effort is normal.     Breath sounds: No wheezing, rhonchi or rales.  Abdominal:     General: Abdomen is flat.     Palpations: There is no mass.     Tenderness: There is no abdominal tenderness. There is no guarding.     Hernia: No hernia is present. There is no hernia in the left inguinal area or right inguinal area.   Genitourinary:    Penis: Normal.      Testes: Normal.     Epididymis:     Right: Normal.     Left: Normal.     Prostate: Normal. Not enlarged, not tender and no nodules present.     Rectum: Normal. Guaiac result negative. No mass, tenderness, anal fissure, external hemorrhoid or internal hemorrhoid. Normal anal tone.  Musculoskeletal:     Cervical back: Neck supple.     Right lower leg: No edema.     Left lower leg: No edema.  Lymphadenopathy:     Lower Body: No right inguinal adenopathy. No left inguinal adenopathy.  Skin:    General: Skin is warm and dry.     Coloration: Skin is not pale.  Neurological:     General: No focal deficit present.     Mental Status: He is alert. Mental status is at baseline.  Psychiatric:        Mood and Affect: Mood normal.        Behavior: Behavior normal.     Lab Results  Component Value Date   WBC 4.9 04/26/2023   HGB 14.5 04/26/2023   HCT 44.2 04/26/2023   PLT 194.0 04/26/2023   GLUCOSE 98 04/26/2023   CHOL 235 (H) 04/26/2023   TRIG 192.0 (H) 04/26/2023   HDL 59.70 04/26/2023   LDLCALC 137 (H) 04/26/2023   ALT 39 04/26/2023   AST 35 04/26/2023   NA 141 04/26/2023   K 3.9 04/26/2023   CL 100 04/26/2023   CREATININE 1.16 04/26/2023   BUN 17 04/26/2023   CO2 32 04/26/2023   TSH 1.36 04/26/2023   PSA 1.08 04/26/2023   INR 1.9 (H) 04/26/2023   HGBA1C 6.2 04/26/2023    CT CARDIAC SCORING ([facility] LOCATIONS ONLY)  Result Date: 01/13/2023 CLINICAL DATA:  57 year old African American male with history of hyperlipidemia and family history of coronary artery disease. * Tracking Code: FCC * EXAM: CT CARDIAC CORONARY ARTERY CALCIUM SCORE TECHNIQUE: Non-contrast imaging through the heart was performed using prospective ECG gating. Image post processing was performed on an independent workstation, allowing for quantitative analysis of the heart and coronary arteries. Note that this exam targets the heart and the chest was not imaged in its  entirety. COMPARISON:  Chest CTA 11/13/2012. FINDINGS: CORONARY CALCIUM SCORES: Left Main: 0 LAD: 0 LCx: 0 RCA/PDA: 0 Total Agatston Score: 0 MESA database percentile: N/A AORTA MEASUREMENTS: Ascending Aorta: 3.6 cm Descending Aorta:2.7 cm OTHER FINDINGS: Areas of apparent scarring are noted in the anterior aspects of the lungs bilaterally involving the anterior right upper lobe, medial segment  of the right middle lobe and anterior aspect of the left upper lobe. Atherosclerotic calcifications in the thoracic aorta. Within the visualized portions of the thorax there are no suspicious appearing pulmonary nodules or masses, there is no acute consolidative airspace disease, no pleural effusions, no pneumothorax and no lymphadenopathy. Visualized portions of the upper abdomen are unremarkable. There are no aggressive appearing lytic or blastic lesions noted in the visualized portions of the skeleton. IMPRESSION: 1. Patient's total coronary artery calcium score is 0 which indicates a very low (but non-zero) risk of significant coronary artery atherosclerosis. 2.  Aortic Atherosclerosis (ICD10-I70.0). Electronically Signed   By: Trudie Reed M.D.   On: 01/13/2023 09:12    Assessment & Plan:  Essential hypertension, benign -     CBC with Differential/Platelet; Future -     TSH; Future -     Urinalysis, Routine w reflex microscopic; Future -     EKG 12-Lead  Hyperlipidemia with target LDL less than 130 -     Lipid panel; Future -     TSH; Future -     Hepatic function panel; Future  Chronic hyperglycemia -     Basic metabolic panel; Future -     Hemoglobin A1c; Future  NASH (nonalcoholic steatohepatitis) -     Protime-INR; Future -     Hepatic function panel; Future  Bradycardia -     TSH; Future -     EKG 12-Lead  Encounter for general adult medical examination with abnormal findings -     PSA; Future  Need for immunization against influenza -     Flu vaccine trivalent PF, 6mos and  older(Flulaval,Afluria,Fluarix,Fluzone)     Follow-up: Return in about 6 months (around 10/25/2023).  Sanda Linger, MD

## 2023-04-29 ENCOUNTER — Ambulatory Visit: Payer: BC Managed Care – PPO | Admitting: Gastroenterology

## 2023-05-05 ENCOUNTER — Encounter: Payer: Self-pay | Admitting: Pediatrics

## 2023-05-16 ENCOUNTER — Other Ambulatory Visit: Payer: Self-pay | Admitting: Internal Medicine

## 2023-05-16 DIAGNOSIS — N522 Drug-induced erectile dysfunction: Secondary | ICD-10-CM

## 2023-06-15 ENCOUNTER — Ambulatory Visit: Payer: BC Managed Care – PPO | Admitting: Pediatrics

## 2023-06-22 ENCOUNTER — Other Ambulatory Visit: Payer: Self-pay | Admitting: Internal Medicine

## 2023-06-22 DIAGNOSIS — K219 Gastro-esophageal reflux disease without esophagitis: Secondary | ICD-10-CM

## 2023-06-25 ENCOUNTER — Other Ambulatory Visit (HOSPITAL_COMMUNITY): Payer: Self-pay

## 2023-06-25 ENCOUNTER — Telehealth: Payer: Self-pay

## 2023-06-25 ENCOUNTER — Telehealth: Payer: Self-pay | Admitting: Pharmacy Technician

## 2023-06-25 NOTE — Telephone Encounter (Signed)
Pharmacy Patient Advocate Encounter   Received notification from CoverMyMeds that prior authorization for OMEPRAZOLE 40MG  is required/requested.   Insurance verification completed.   The patient is insured through [facility] .   Per test claim:

## 2023-06-25 NOTE — Telephone Encounter (Signed)
Pharmacy Patient Advocate Encounter   Received notification from Pt Calls Messages that prior authorization for Omeprazole 40mg  caps is required/requested.   Insurance verification completed.   The patient is insured through [facility] .   Per test claim: PA required; PA submitted to above mentioned insurance via CoverMyMeds Key/confirmation #/[id number] Status is pending

## 2023-06-25 NOTE — Telephone Encounter (Signed)
PA needed for omeprazole please advise.

## 2023-06-29 NOTE — Telephone Encounter (Signed)
 Pharmacy Patient Advocate Encounter  Received notification from [facility] that Prior Authorization for Omeprazole  mg DR capsules  has been DENIED.  Full denial letter will be uploaded to the media tab. See denial reason below.   PA #/Case ID/Reference #: [id number]

## 2023-06-29 NOTE — Telephone Encounter (Signed)
Alternative for denied medication? Please advise

## 2023-06-30 ENCOUNTER — Other Ambulatory Visit: Payer: Self-pay | Admitting: Internal Medicine

## 2023-06-30 DIAGNOSIS — K219 Gastro-esophageal reflux disease without esophagitis: Secondary | ICD-10-CM

## 2023-06-30 MED ORDER — VOQUEZNA 10 MG PO TABS: 1.0000 | ORAL_TABLET | Freq: Every day | ORAL | 0 refills | Status: DC

## 2023-07-05 NOTE — Progress Notes (Signed)
 [facility] Initial Consultation   Referring Provider Etta Grandchild, MD [address] [place],  [place] [address]  Primary Care Provider Etta Grandchild, MD  Patient Profile: Willie Foster is a 58 y.o. male who is seen in consultation in the [facility] at the request of Dr. Yetta Barre for evaluation and management of the problem(s) noted below.  Problem List: Colon cancer screening GERD Hepatic steatosis on ultrasound imaging 2015, normal liver enzymes   History of Present Illness   Willie Foster is a 58 y.o. male with a history of HTN, HLD, PE on Xarelto who is referred to discuss screening colonoscopy in the setting of multiple medical comorbidities and anticoagulation.  Willie Foster last colonoscopy was performed in 2014 -noted to have diverticuli, internal hemorrhoids but otherwise normal. Denies rectal bleeding, change in bowel habits or abdominal pain No family history of colon cancer Family history of colon polyps  Willie Foster denies any previous problems with anesthesia He has been on Xarelto since 2015 without any bleeding issues  Willie Foster endorses a history of GERD and states that this is currently stable This is managed with diet and not requiring pharmacotherapy  Records also document a previous history of hepatic steatosis on ultrasound in 2015 Laboratory testing in 2022 showed AST 39, ALT 54 -Labs have been normal since that time No personal or family history of liver disease   Last colonoscopy: 04/2013-diverticuli, internal hemorrhoids, otherwise normal Last endoscopy: None  Last Abd CT/CTE/MRE: None  GI Review of Symptoms Significant for None. Otherwise negative.  General Review of Systems  Review of systems is significant for the pertinent positives and negatives as listed per the HPI.  Full ROS is otherwise negative.  Past Medical History   Past Medical History:  Diagnosis Date   Elevated serum creatinine 06/15/2016    1.66 06/02/16   Fatty liver disease, nonalcoholic 2011   Hyperlipidemia    Hypertension    Leukopenia 01/18/2013   WBC 3,400 01/18/13 was normal 8/22 Xarelto?   LPRD (laryngopharyngeal reflux disease)    NASH (nonalcoholic steatohepatitis)    Pulmonary embolism (HCC) 10/2012   Right lung     Past Surgical History  History reviewed. No pertinent surgical history.   Allergies and Medications   Allergies  Allergen Reactions   Lipitor [Atorvastatin]     Elevated LFT's    Current Meds  Medication Sig   acyclovir (ZOVIRAX) 400 MG tablet TAKE 1 TABLET BY MOUTH THREE TIMES DAILY AS NEEDED FOR FEVER BLISTERS   chlorthalidone (HYGROTON) 25 MG tablet Take 1 tablet (25 mg total) by mouth daily.   Cholecalciferol (VITAMIN D3 PO) Take 1,000 Units by mouth daily.   fexofenadine (ALLEGRA) 180 MG tablet Take 1 tablet (180 mg total) by mouth daily.   fluticasone (FLONASE) 50 MCG/ACT nasal spray Place 2 sprays into both nostrils daily. (Patient taking differently: Place 2 sprays into both nostrils as needed for allergies.)   Multiple Vitamin (MULTIVITAMIN) tablet Take 1 tablet by mouth daily.   NON FORMULARY Potassium, iron, fish oil taken once daily   olmesartan (BENICAR) 20 MG tablet Take 1 tablet (20 mg total) by mouth daily.   potassium chloride SA (KLOR-CON M) 20 MEQ tablet Take 1 tablet (20 mEq total) by mouth daily.   rivaroxaban (XARELTO) 10 MG TABS tablet Take 1 tablet (10 mg total) by mouth daily.   rosuvastatin (CRESTOR) 20 MG tablet Take 1 tablet (20 mg total) by mouth daily.   sildenafil (REVATIO) 20 MG  tablet TAKE 4 TABLETS BY MOUTH ONCE DAILY AS NEEDED   Sodium Sulfate-Mag Sulfate-KCl (SUTAB) [phone] MG TABS Use as directed for colonoscopy. MANUFACTURER CODES!! BIN: [id number] PCN: CN GROUP: [id number] MEMBER ID: [id number];VHQ AS SECONDARY INSURANCE ;NO PRIOR AUTHORIZATION   Turmeric 500 MG CAPS Take 1 tablet by mouth 2 (two) times daily. (Patient taking differently: Take 1 tablet  by mouth daily.)   [DISCONTINUED] Vonoprazan Fumarate (VOQUEZNA) 10 MG TABS Take 1 tablet by mouth daily.    Family History   Family History  Problem Relation Age of Onset   Hypertension Father    Alcohol abuse Neg Hx    COPD Neg Hx    Diabetes Neg Hx    Drug abuse Neg Hx    Early death Neg Hx    Heart disease Neg Hx    Hyperlipidemia Neg Hx    Kidney disease Neg Hx    Stroke Neg Hx    Lung cancer Father    Breast cancer Mother        mets to brain     Social History   Social History   Tobacco Use   Smoking status: Never   Smokeless tobacco: Never  Vaping Use   Vaping status: Never Used  Substance Use Topics   Alcohol use: Yes    Alcohol/week: 0.0 standard drinks of alcohol    Comment: occasional-twice a week   Drug use: No   Foster reports that he has never smoked. He has never used smokeless tobacco. He reports current alcohol use. He reports that he does not use drugs.  Vital Signs and Physical Examination   Vitals:   07/07/23 0901  BP: 122/70  Pulse: 90  SpO2: 95%   Body mass index is 32.06 kg/m. Weight: 217 lb 2 oz (98.5 kg)  General: Well developed, well nourished, no acute distress Head: Normocephalic and atraumatic Eyes: Sclerae anicteric, EOMI Lungs: Clear throughout to auscultation Heart: Regular rate and rhythm; No murmurs, rubs or bruits Abdomen: Soft, non tender and non distended. No masses, hepatosplenomegaly or hernias noted. Normal Bowel sounds Rectal: Deferred Musculoskeletal: Symmetrical with no gross deformities  Extremities: No edema or deformities noted  Review of Data  The following data was reviewed at the time of this encounter:  Laboratory Studies      Latest Ref Rng & Units 04/26/2023   10:42 AM 03/26/2022    9:02 AM 03/25/2021   11:03 AM  CBC  WBC 4.0 - 10.5 K/uL 4.9  3.8  3.3   Hemoglobin 13.0 - 17.0 g/dL 46.9  62.9  52.8   Hematocrit 39.0 - 52.0 % 44.2  43.6  44.5   Platelets 150.0 - 400.0 K/uL 194.0  156.0  169.0      No results found for: "LIPASE"    Latest Ref Rng & Units 04/26/2023   10:42 AM 09/24/2022    9:10 AM 03/26/2022    9:02 AM  CMP  Glucose 70 - 99 mg/dL 98  96  413   BUN 6 - 23 mg/dL 17  17  18    Creatinine 0.40 - 1.50 mg/dL 2.44  0.10  2.72   Sodium 135 - 145 mEq/L 141  140  141   Potassium 3.5 - 5.1 mEq/L 3.9  3.6  3.8   Chloride 96 - 112 mEq/L 100  99  101   CO2 19 - 32 mEq/L 32  33  32   Calcium 8.4 - 10.5 mg/dL 53.6  64.4  03.4  Total Protein 6.0 - 8.3 g/dL 7.4  7.4    Total Bilirubin 0.2 - 1.2 mg/dL 0.6  0.5    Alkaline Phos 39 - 117 U/L 71  57    AST 0 - 37 U/L 35  36    ALT 0 - 53 U/L 39  33       Imaging Studies  Abdominal ultrasound 01/2014 Hepatic steatosis  GI Procedures and Studies  Colonoscopy 04/2023 Diverticuli, IH, otherwise normal   Clinical Impression  It is my clinical impression that Willie Foster is a 58 y.o. male with;  Colon cancer screening GERD Hepatic steatosis on ultrasound imaging 2015, normal liver enzymes  Willie Foster presents to discuss screening colonoscopy in the setting of medical comorbidities including  HTN, HLD, PE on Xarelto.  Last colonoscopy was performed in 2014 and was normal.  No current symptoms of change in bowel habits or rectal bleeding.  No family history of colon cancer or colorectal polyps.  Reviewed potential risks of colonoscopy including anesthesia, aspiration, perforation and bleeding.  As he is on Xarelto we would typically recommend holding the medication for 48 hours prior to colonoscopy.  Our office will communicate with Dr. Sanda Foster who prescribes the medication for him for confirmation of anticoagulation management in the periprocedural time period.  It is also noteworthy that he has had a history of hepatic steatosis on ultrasound in the past with generally normal liver enzymes.  Hepatic transaminases were minimally elevated 2 years ago but have been normal since that time.  Recommend continuing ongoing  conservative management and monitoring.  Plan  Schedule screening colonoscopy at [facility] Our office will communicate with Dr. Sanda Foster regarding Xarelto management in the periprocedural time period to confirm holding Xarelto x 48 hours prior to procedure Continue monitoring liver enzymes and follow trend in setting of hepatic steatosis. Monitor weight and anthropometrics  Mr. Mchan relates that he prefers telephone communication from the office rather than MyChart portal messages.  This request was relayed to the medical assistants at the time of scheduling his colonoscopy.  Planned Follow Up 1 year  The patient or caregiver verbalized understanding of the material covered, with no barriers to understanding. All questions were answered. Patient or caregiver is agreeable with the plan outlined above.    It was a pleasure to see Willie Foster.  If you have any questions or concerns regarding this evaluation, do not hesitate to contact me.  Maren Beach, MD [facility]

## 2023-07-07 ENCOUNTER — Other Ambulatory Visit: Payer: Self-pay | Admitting: Internal Medicine

## 2023-07-07 ENCOUNTER — Telehealth: Payer: Self-pay

## 2023-07-07 ENCOUNTER — Encounter: Payer: Self-pay | Admitting: Pediatrics

## 2023-07-07 ENCOUNTER — Encounter: Payer: Self-pay | Admitting: Internal Medicine

## 2023-07-07 ENCOUNTER — Ambulatory Visit: Payer: BC Managed Care – PPO | Admitting: Pediatrics

## 2023-07-07 VITALS — BP 122/70 | HR 90 | Ht 69.0 in | Wt 217.1 lb

## 2023-07-07 DIAGNOSIS — K760 Fatty (change of) liver, not elsewhere classified: Secondary | ICD-10-CM

## 2023-07-07 DIAGNOSIS — K219 Gastro-esophageal reflux disease without esophagitis: Secondary | ICD-10-CM | POA: Diagnosis not present

## 2023-07-07 DIAGNOSIS — Z1211 Encounter for screening for malignant neoplasm of colon: Secondary | ICD-10-CM

## 2023-07-07 MED ORDER — VOQUEZNA 10 MG PO TABS: 1.0000 | ORAL_TABLET | Freq: Every day | ORAL | 3 refills | Status: DC

## 2023-07-07 MED ORDER — SUTAB 1479-225-188 MG PO TABS: ORAL_TABLET | ORAL | 0 refills | Status: DC

## 2023-07-07 NOTE — Patient Instructions (Addendum)
You have been scheduled for a colonoscopy. Please follow written instructions given to you at your visit today.   If you use inhalers (even only as needed), please bring them with you on the day of your procedure.  DO NOT TAKE 7 DAYS PRIOR TO TEST- Trulicity (dulaglutide) Ozempic, Wegovy (semaglutide) Mounjaro (tirzepatide) Bydureon Bcise (exanatide extended release)  DO NOT TAKE 1 DAY PRIOR TO YOUR TEST Rybelsus (semaglutide) Adlyxin (lixisenatide) Victoza (liraglutide) Byetta (exanatide) ___________________________________________________________________________  Bonita Quin will receive your bowel preparation through [facility], which ensures the lowest copay and home delivery, with outreach via text or call from an [phone] number. Please respond promptly to avoid rescheduling of your procedure. If you are interested in alternative options or have any questions regarding your prep, please contact them at [phone] ____________________________________________________________________________  Your Provider Has Sent Your Bowel Prep Regimen To [facility]   [facility] will contact you to verify your information and collect your copay, if applicable. Enjoy the comfort of your home while your prescription is mailed to you, FREE of any shipping charges.   [facility] accepts all major insurance benefits and applies discounts & coupons.  Have additional questions?   Chat: [email or web address] Call: [phone] Email: [email or web address] NCPDP: [id number]  How will [facility] contact you?  With a Welcome phone call,  a Welcome text and a checkout link in text form.  Texts you receive from [phone] Are NOT Spam.  *To set up delivery, you must complete the checkout process via link or speak to one of the patient care representatives. If [facility] is unable to reach you, your prescription may be delayed.  To avoid long hold times on the phone, you may also utilize the secure chat  feature on the [facility] website to request that they call you back for transaction completion or to expedite your concerns.   We have sent the following medications to your pharmacy for you to pick up at your convenience: Voquezna   You will be contacted by our office prior to your procedure for directions on holding your XARELTO.  If you do not hear from our office 1 week prior to your scheduled procedure, please call [phone] to discuss.   Please follow up in 1 year.  Thank you for entrusting me with your care and for choosing [facility], Dr. Maren Beach

## 2023-07-07 NOTE — Telephone Encounter (Signed)
  Willie Foster 01-31-1966 [id number]   Dear Dr. Yetta Barre:  We have scheduled the above named patient for a(n) Colonoscopy procedure. Our records show that he is on anticoagulation therapy.  Please advise as to whether the patient may come off their therapy of Xarelto 2 days prior to their procedure which is scheduled for 07-29-23.  Please route your response to Lucius Conn, CMA or fax response to [phone].  Sincerely,    [facility]

## 2023-07-07 NOTE — Telephone Encounter (Signed)
Patient called and stated that he has received your instruction and verbalized understanding. Please advise.

## 2023-07-07 NOTE — Telephone Encounter (Signed)
According to letter in the chart from Dr. Yetta Barre:  "To Whom It May Concern:   It is my medical opinion that Willie Foster  should hold the DOAC  2 days prior to GI procedure .   If you have any questions or concerns, please don't hesitate to call."  Called and left detailed message for patient that he should hold Xarelto on 3-4 and 3-5 and Dr. Doy Hutching will let him know when to resume after his procedure on 3-6.  Asked that patient call back to confirm understanding

## 2023-07-12 ENCOUNTER — Other Ambulatory Visit: Payer: Self-pay | Admitting: Internal Medicine

## 2023-07-12 DIAGNOSIS — N522 Drug-induced erectile dysfunction: Secondary | ICD-10-CM

## 2023-07-26 ENCOUNTER — Ambulatory Visit: Admitting: Family Medicine

## 2023-07-26 ENCOUNTER — Ambulatory Visit: Payer: Self-pay | Admitting: Internal Medicine

## 2023-07-26 NOTE — Telephone Encounter (Signed)
   Chief Complaint: swollen node in neck- tender, sinus drainage/congestion Symptoms: see above Frequency: started this morning Pertinent Negatives: Patient denies fever, sore throat Disposition: [] ED /[] Urgent Care (no appt availability in office) / [x] Appointment(In office/virtual)/ []  [facility]/ [] Home Care/ [] Refused Recommended Disposition /[] [facility]/ []  Follow-up with PCP Additional Notes: Patient has swollen neck node and irritation in throat- he has procedure this week and is concerned infection/having to postpone  Copied from CRM #[id number]. Topic: Clinical - Red Word Triage >> Jul 26, 2023  8:15 AM [place] A wrote: [facility] that prompted transfer to Nurse Triage: Patient has swollen glands, neck pain and headache,discomfort with rotating his neck Reason for Disposition  [1] Tender node in the neck AND [2] also has a sore throat AND [3] minimal/no runny nose or cough  Answer Assessment - Initial Assessment Questions 1. LOCATION: "Where is the swollen node located?" "Is the matching node on the other side of the body also swollen?"      Swollen glands in neck-L 2. SIZE: "How big is the node?" (e.g., inches or centimeters; or compared to common objects such as pea, bean, marble, golf ball)      Unable to feel 3. ONSET: "When did the swelling start?"      This morning 4. NECK NODES: "Is there a sore throat, runny nose or other symptoms of a cold?"      Cough, congestion- sinus drainage  6. FEVER: "Do you have a fever?" If Yes, ask: "What is it, how was it measured, and when did it start?"      no 7. CAUSE: "What do you think is causing the swollen lymph nodes?"     unsure 8. OTHER SYMPTOMS: "Do you have any other symptoms?"     Decreased energy  Protocols used: Lymph Nodes - Swollen-A-AH

## 2023-07-27 NOTE — Progress Notes (Unsigned)
  Gastroenterology History and Physical   Primary Care Physician:  Etta Grandchild, MD   Reason for Procedure:  Colon cancer screening  Plan:    Screening colonoscopy  HPI: Jordon Kristiansen is a 58 y.o. male undergoing screening colonoscopy for colon cancer screening.  His last colonoscopy was performed in 2014 demonstrating diverticuli and internal hemorrhoids.  No polyps.  No family history of colorectal cancer.  He does report a family history of colon polyps.  Currently denies rectal bleeding, change in bowel habits or abdominal pain.  He is on Xarelto for history of PE and was advised to discontinue his medication 48 hours prior to colonoscopy..   Past Medical History:  Diagnosis Date   Elevated serum creatinine 06/15/2016   1.66 06/02/16   Fatty liver disease, nonalcoholic 2011   Hyperlipidemia    Hypertension    Leukopenia 01/18/2013   WBC 3,400 01/18/13 was normal 8/22 Xarelto?   LPRD (laryngopharyngeal reflux disease)    NASH (nonalcoholic steatohepatitis)    Pulmonary embolism (HCC) 10/2012   Right lung    No past surgical history on file.  Prior to Admission medications   Medication Sig Start Date End Date Taking? Authorizing Provider  acyclovir (ZOVIRAX) 400 MG tablet TAKE 1 TABLET BY MOUTH THREE TIMES DAILY AS NEEDED FOR FEVER BLISTERS 08/11/22   Etta Grandchild, MD  chlorthalidone (HYGROTON) 25 MG tablet Take 1 tablet (25 mg total) by mouth daily. 11/30/22   Plotnikov, Georgina Quint, MD  Cholecalciferol (VITAMIN D3 PO) Take 1,000 Units by mouth daily.    [provider]  fexofenadine (ALLEGRA) 180 MG tablet Take 1 tablet (180 mg total) by mouth daily. 11/30/22 11/30/23  Plotnikov, Georgina Quint, MD  fluticasone (FLONASE) 50 MCG/ACT nasal spray Place 2 sprays into both nostrils daily. Patient taking differently: Place 2 sprays into both nostrils as needed for allergies. 11/30/22   Plotnikov, Georgina Quint, MD  Multiple Vitamin (MULTIVITAMIN) tablet Take 1 tablet by mouth  daily.    [provider]  NON FORMULARY Potassium, iron, fish oil taken once daily    [provider]  olmesartan (BENICAR) 20 MG tablet Take 1 tablet (20 mg total) by mouth daily. 11/30/22   Plotnikov, Georgina Quint, MD  potassium chloride SA (KLOR-CON M) 20 MEQ tablet Take 1 tablet (20 mEq total) by mouth daily. 11/30/22   Plotnikov, Georgina Quint, MD  rivaroxaban (XARELTO) 10 MG TABS tablet Take 1 tablet (10 mg total) by mouth daily. 11/30/22   Plotnikov, Georgina Quint, MD  rosuvastatin (CRESTOR) 20 MG tablet Take 1 tablet (20 mg total) by mouth daily. 11/30/22   Plotnikov, Georgina Quint, MD  sildenafil (REVATIO) 20 MG tablet TAKE 4 TABLETS BY MOUTH ONCE DAILY AS NEEDED 07/14/23   Etta Grandchild, MD  Sodium Sulfate-Mag Sulfate-KCl (SUTAB) [phone] MG TABS Use as directed for colonoscopy. MANUFACTURER CODES!! BIN: [id number] PCN: CN GROUP: [id number] MEMBER ID: [id number];YQI AS SECONDARY INSURANCE ;NO PRIOR AUTHORIZATION 07/07/23   Kelly Ranieri, Durene Romans, MD  Turmeric 500 MG CAPS Take 1 tablet by mouth 2 (two) times daily. Patient taking differently: Take 1 tablet by mouth daily. 08/30/13   Judi Saa, DO  Vonoprazan Fumarate (VOQUEZNA) 10 MG TABS Take 1 tablet by mouth daily. 07/07/23   Ottie Glazier, MD    Current Outpatient Medications  Medication Sig Dispense Refill   acyclovir (ZOVIRAX) 400 MG tablet TAKE 1 TABLET BY MOUTH THREE TIMES DAILY AS NEEDED FOR FEVER BLISTERS 30 tablet 2   chlorthalidone (  HYGROTON) 25 MG tablet Take 1 tablet (25 mg total) by mouth daily. 90 tablet 2   Cholecalciferol (VITAMIN D3 PO) Take 1,000 Units by mouth daily.     fexofenadine (ALLEGRA) 180 MG tablet Take 1 tablet (180 mg total) by mouth daily. 90 tablet 3   fluticasone (FLONASE) 50 MCG/ACT nasal spray Place 2 sprays into both nostrils daily. (Patient taking differently: Place 2 sprays into both nostrils as needed for allergies.) 16 g 3   Multiple Vitamin (MULTIVITAMIN) tablet Take 1 tablet by mouth daily.      NON FORMULARY Potassium, iron, fish oil taken once daily     olmesartan (BENICAR) 20 MG tablet Take 1 tablet (20 mg total) by mouth daily. 90 tablet 2   potassium chloride SA (KLOR-CON M) 20 MEQ tablet Take 1 tablet (20 mEq total) by mouth daily. 90 tablet 1   rivaroxaban (XARELTO) 10 MG TABS tablet Take 1 tablet (10 mg total) by mouth daily. 90 tablet 2   rosuvastatin (CRESTOR) 20 MG tablet Take 1 tablet (20 mg total) by mouth daily. 90 tablet 2   sildenafil (REVATIO) 20 MG tablet TAKE 4 TABLETS BY MOUTH ONCE DAILY AS NEEDED 60 tablet 0   Sodium Sulfate-Mag Sulfate-KCl (SUTAB) [phone] MG TABS Use as directed for colonoscopy. MANUFACTURER CODES!! BIN: [id number] PCN: CN GROUP: [id number] MEMBER ID: [id number];XLK AS SECONDARY INSURANCE ;NO PRIOR AUTHORIZATION 24 tablet 0   Turmeric 500 MG CAPS Take 1 tablet by mouth 2 (two) times daily. (Patient taking differently: Take 1 tablet by mouth daily.)     Vonoprazan Fumarate (VOQUEZNA) 10 MG TABS Take 1 tablet by mouth daily. 90 tablet 3   No current facility-administered medications for this visit.    Allergies as of 07/29/2023 - Review Complete 07/07/2023  Allergen Reaction Noted   Lipitor [atorvastatin]  09/16/2012    Family History  Problem Relation Age of Onset   Hypertension Father    Alcohol abuse Neg Hx    COPD Neg Hx    Diabetes Neg Hx    Drug abuse Neg Hx    Early death Neg Hx    Heart disease Neg Hx    Hyperlipidemia Neg Hx    Kidney disease Neg Hx    Stroke Neg Hx    Lung cancer Father    Breast cancer Mother        mets to brain    Social History   Socioeconomic History   Marital status: Single    Spouse name: Not on file   Number of children: 0   Years of education: Not on file   Highest education level: Not on file  Occupational History   Occupation: Software engineer    Employer: [employer]  Tobacco Use   Smoking status: Never   Smokeless tobacco: Never  Vaping Use   Vaping status: Never Used   Substance and Sexual Activity   Alcohol use: Yes    Alcohol/week: 0.0 standard drinks of alcohol    Comment: occasional-twice a week   Drug use: No   Sexual activity: Not on file  Other Topics Concern   Not on file  Social History Narrative   Not on file   Social Drivers of Health   Financial Resource Strain: Not on file  Food Insecurity: Not on file  Transportation Needs: Not on file  Physical Activity: Not on file  Stress: Not on file  Social Connections: Not on file  Intimate Partner Violence: Not on file  Review of Systems:  All other review of systems negative except as mentioned in the HPI.  Physical Exam: Vital signs There were no vitals taken for this visit.  General:   Alert,  Well-developed, well-nourished, pleasant and cooperative in NAD Airway:  Mallampati  Lungs:  Clear throughout to auscultation.   Heart:  Regular rate and rhythm; no murmurs, clicks, rubs,  or gallops. Abdomen:  Soft, nontender and nondistended. Normal bowel sounds.   Neuro/Psych:  Normal mood and affect. A and O x 3  Maren Beach, MD [facility]

## 2023-07-28 ENCOUNTER — Ambulatory Visit: Admitting: Internal Medicine

## 2023-07-29 ENCOUNTER — Encounter: Payer: Self-pay | Admitting: Pediatrics

## 2023-07-29 ENCOUNTER — Ambulatory Visit: Payer: BC Managed Care – PPO | Admitting: Pediatrics

## 2023-07-29 VITALS — BP 137/85 | HR 58 | Temp 97.7°F | Resp 16 | Ht 69.0 in | Wt 217.2 lb

## 2023-07-29 DIAGNOSIS — K5730 Diverticulosis of large intestine without perforation or abscess without bleeding: Secondary | ICD-10-CM

## 2023-07-29 DIAGNOSIS — K648 Other hemorrhoids: Secondary | ICD-10-CM | POA: Diagnosis not present

## 2023-07-29 DIAGNOSIS — K5790 Diverticulosis of intestine, part unspecified, without perforation or abscess without bleeding: Secondary | ICD-10-CM

## 2023-07-29 DIAGNOSIS — K649 Unspecified hemorrhoids: Secondary | ICD-10-CM

## 2023-07-29 DIAGNOSIS — Z1211 Encounter for screening for malignant neoplasm of colon: Secondary | ICD-10-CM

## 2023-07-29 MED ORDER — SODIUM CHLORIDE 0.9 % IV SOLN: 500.0000 mL | Freq: Once | INTRAVENOUS | Status: DC

## 2023-07-29 NOTE — Patient Instructions (Signed)
-  Resume Xarelto at prior dose today -Handout on hemorrhoids and diverticulosis provided -repeat colonoscopy in 10 years for surveillance recommended -Continue present medications    YOU HAD AN ENDOSCOPIC PROCEDURE TODAY AT THE [facility]:   Refer to the procedure report that was given to you for any specific questions about what was found during the examination.  If the procedure report does not answer your questions, please call your gastroenterologist to clarify.  If you requested that your care partner not be given the details of your procedure findings, then the procedure report has been included in a sealed envelope for you to review at your convenience later.  YOU SHOULD EXPECT: Some feelings of bloating in the abdomen. Passage of more gas than usual.  Walking can help get rid of the air that was put into your GI tract during the procedure and reduce the bloating. If you had a lower endoscopy (such as a colonoscopy or flexible sigmoidoscopy) you may notice spotting of blood in your stool or on the toilet paper. If you underwent a bowel prep for your procedure, you may not have a normal bowel movement for a few days.  Please Note:  You might notice some irritation and congestion in your nose or some drainage.  This is from the oxygen used during your procedure.  There is no need for concern and it should clear up in a day or so.  SYMPTOMS TO REPORT IMMEDIATELY:  Following lower endoscopy (colonoscopy or flexible sigmoidoscopy):  Excessive amounts of blood in the stool  Significant tenderness or worsening of abdominal pains  Swelling of the abdomen that is new, acute  Fever of 100F or higher  For urgent or emergent issues, a gastroenterologist can be reached at any hour by calling [phone]. Do not use MyChart messaging for urgent concerns.    DIET:  We do recommend a small meal at first, but then you may proceed to your regular diet.  Drink plenty of fluids but you  should avoid alcoholic beverages for 24 hours.  ACTIVITY:  You should plan to take it easy for the rest of today and you should NOT DRIVE or use heavy machinery until tomorrow (because of the sedation medicines used during the test).    FOLLOW UP: Our staff will call the number listed on your records the next business day following your procedure.  We will call around 7:15- 8:00 am to check on you and address any questions or concerns that you may have regarding the information given to you following your procedure. If we do not reach you, we will leave a message.     If any biopsies were taken you will be contacted by phone or by letter within the next 1-3 weeks.  Please call us at [phone] if you have not heard about the biopsies in 3 weeks.    SIGNATURES/CONFIDENTIALITY: You and/or your care partner have signed paperwork which will be entered into your electronic medical record.  These signatures attest to the fact that that the information above on your After Visit Summary has been reviewed and is understood.  Full responsibility of the confidentiality of this discharge information lies with you and/or your care-partner.

## 2023-07-29 NOTE — Op Note (Signed)
 [facility] Patient Name: Willie Foster Procedure Date: 07/29/2023 10:17 AM MRN: [id number] Endoscopist: Maren Beach , MD, [id number] Age: 58 Referring MD:  Date of Birth: [birth year] Gender: Male Account #: [id number] Procedure:                Colonoscopy Indications:              Screening for colorectal malignant neoplasm, Last                            colonoscopy: 2014 Medicines:                Monitored Anesthesia Care Procedure:                Pre-Anesthesia Assessment:                           - Prior to the procedure, a History and Physical                            was performed, and patient medications and                            allergies were reviewed. The patient's tolerance of                            previous anesthesia was also reviewed. The risks                            and benefits of the procedure and the sedation                            options and risks were discussed with the patient.                            All questions were answered, and informed consent                            was obtained. Prior Anticoagulants: The patient has                            taken Xarelto (rivaroxaban), last dose was 2 days                            prior to procedure. ASA Grade Assessment: II - A                            patient with mild systemic disease. After reviewing                            the risks and benefits, the patient was deemed in                            satisfactory condition to undergo the procedure.  After obtaining informed consent, the colonoscope                            was passed under direct vision. Throughout the                            procedure, the patient's blood pressure, pulse, and                            oxygen saturations were monitored continuously. The                            CF [id number] #[id number] was introduced through the anus                            and advanced to  the cecum, identified by                            appendiceal orifice and ileocecal valve. The                            colonoscopy was performed without difficulty. The                            patient tolerated the procedure well. The quality                            of the bowel preparation was good. The appendiceal                            orifice and the rectum were photographed. Scope In: 10:30:46 AM Scope Out: 10:45:24 AM Scope Withdrawal Time: 0 hours 11 minutes 41 seconds  Total Procedure Duration: 0 hours 14 minutes 38 seconds  Findings:                 The perianal and digital rectal examinations were                            normal. Pertinent negatives include normal                            sphincter tone and no palpable rectal lesions.                           A few small-mouthed diverticula were found in the                            sigmoid colon, descending colon and ascending colon.                           Internal hemorrhoids were found during retroflexion. Complications:            No immediate complications. Estimated blood loss:  None. Estimated Blood Loss:     Estimated blood loss: none. Impression:               - Diverticulosis in the sigmoid colon, in the                            descending colon and in the ascending colon.                           - Internal hemorrhoids.                           - No specimens collected. Recommendation:           - Discharge patient to home (ambulatory).                           - Repeat colonoscopy in 10 years for screening                            purposes.                           - Resume Xarelto (rivaroxaban) at prior dose today.                           - The findings and recommendations were discussed                            with the patient's family.                           - Return to referring physician.                           - Patient has a contact number  available for                            emergencies. The signs and symptoms of potential                            delayed complications were discussed with the                            patient. Return to normal activities tomorrow.                            Written discharge instructions were provided to the                            patient. Maren Beach, MD 07/29/2023 10:50:50 AM This report has been signed electronically.

## 2023-07-29 NOTE — Progress Notes (Signed)
 Vss nad trans to pacu

## 2023-07-30 ENCOUNTER — Telehealth: Payer: Self-pay | Admitting: *Deleted

## 2023-07-30 NOTE — Telephone Encounter (Signed)
  Follow up Call-     07/29/2023    9:25 AM  Call back number  Post procedure Call Back phone  # [phone]  Permission to leave phone message Yes     Patient questions:  Do you have a fever, pain , or abdominal swelling? No. Pain Score  0 *  Have you tolerated food without any problems? Yes.   Have you been able to return to your normal activities? Yes.   Do you have any questions about your discharge instructions: Diet   No. Medications  No. Follow up visit  No.  Do you have questions or concerns about your Care? No.  Actions: * If pain score is 4 or above: No action needed, pain <4.

## 2023-08-10 ENCOUNTER — Other Ambulatory Visit: Payer: Self-pay | Admitting: Internal Medicine

## 2023-08-10 DIAGNOSIS — N522 Drug-induced erectile dysfunction: Secondary | ICD-10-CM

## 2023-08-21 ENCOUNTER — Other Ambulatory Visit: Payer: Self-pay | Admitting: Internal Medicine

## 2023-08-21 DIAGNOSIS — E785 Hyperlipidemia, unspecified: Secondary | ICD-10-CM

## 2023-08-21 DIAGNOSIS — I2782 Chronic pulmonary embolism: Secondary | ICD-10-CM

## 2023-08-21 DIAGNOSIS — I10 Essential (primary) hypertension: Secondary | ICD-10-CM

## 2023-08-21 DIAGNOSIS — D6859 Other primary thrombophilia: Secondary | ICD-10-CM

## 2023-09-08 ENCOUNTER — Other Ambulatory Visit: Payer: Self-pay | Admitting: Internal Medicine

## 2023-09-08 DIAGNOSIS — N522 Drug-induced erectile dysfunction: Secondary | ICD-10-CM

## 2023-09-09 ENCOUNTER — Other Ambulatory Visit: Payer: Self-pay | Admitting: Internal Medicine

## 2023-09-09 DIAGNOSIS — N522 Drug-induced erectile dysfunction: Secondary | ICD-10-CM

## 2023-09-15 ENCOUNTER — Other Ambulatory Visit: Payer: Self-pay | Admitting: Internal Medicine

## 2023-09-15 DIAGNOSIS — N522 Drug-induced erectile dysfunction: Secondary | ICD-10-CM

## 2023-09-15 MED ORDER — SILDENAFIL CITRATE 20 MG PO TABS
ORAL_TABLET | ORAL | 0 refills | Status: AC
Start: 2023-09-15 — End: ?

## 2023-09-15 NOTE — Telephone Encounter (Signed)
 Copied from CRM [id number]. Topic: Clinical - Medication Refill >> Sep 15, 2023 12:54 PM Marlan Silva wrote: Most Recent Primary Care Visit:  Provider: Arcadio Knuckles  Department: [facility]  Visit Type: PHYSICAL  Date: 04/26/2023  Medication: sildenafil  (REVATIO ) 20 MG tablet  Has the patient contacted their pharmacy? Yes (Agent: If no, request that the patient contact the pharmacy for the refill. If patient does not wish to contact the pharmacy document the reason why and proceed with request.) (Agent: If yes, when and what did the pharmacy advise?)  Is this the correct pharmacy for this prescription? Yes If no, delete pharmacy and type the correct one.  This is the patient's preferred pharmacy:  [facility] [address], [place] - [address] [place] [address] Phone: [phone] Fax: [phone]   Has the prescription been filled recently? No  Is the patient out of the medication? Yes  Has the patient been seen for an appointment in the last year OR does the patient have an upcoming appointment? Yes  Can we respond through MyChart? Yes  Agent: Please be advised that Rx refills may take up to 3 business days. We ask that you follow-up with your pharmacy.

## 2023-10-25 ENCOUNTER — Ambulatory Visit: Payer: BC Managed Care – PPO | Admitting: Internal Medicine

## 2023-11-03 ENCOUNTER — Other Ambulatory Visit: Payer: Self-pay | Admitting: Internal Medicine

## 2023-11-03 DIAGNOSIS — I2782 Chronic pulmonary embolism: Secondary | ICD-10-CM

## 2023-11-03 DIAGNOSIS — E785 Hyperlipidemia, unspecified: Secondary | ICD-10-CM

## 2023-11-03 DIAGNOSIS — D6859 Other primary thrombophilia: Secondary | ICD-10-CM

## 2023-11-03 DIAGNOSIS — I10 Essential (primary) hypertension: Secondary | ICD-10-CM

## 2023-11-03 DIAGNOSIS — B001 Herpesviral vesicular dermatitis: Secondary | ICD-10-CM

## 2023-11-03 DIAGNOSIS — N522 Drug-induced erectile dysfunction: Secondary | ICD-10-CM

## 2023-12-22 ENCOUNTER — Other Ambulatory Visit: Payer: Self-pay | Admitting: Internal Medicine

## 2023-12-22 DIAGNOSIS — N522 Drug-induced erectile dysfunction: Secondary | ICD-10-CM

## 2023-12-29 ENCOUNTER — Other Ambulatory Visit: Payer: Self-pay | Admitting: Internal Medicine

## 2023-12-29 DIAGNOSIS — N522 Drug-induced erectile dysfunction: Secondary | ICD-10-CM

## 2023-12-30 NOTE — Telephone Encounter (Signed)
 Last OV 04/26/23 Next OV 04/25/24  Last refill 11/09/23 Qty #60/0

## 2024-01-04 ENCOUNTER — Ambulatory Visit: Admitting: Internal Medicine

## 2024-01-04 ENCOUNTER — Encounter: Payer: Self-pay | Admitting: Internal Medicine

## 2024-01-04 VITALS — BP 142/92 | HR 59 | Temp 98.4°F | Ht 69.0 in | Wt 216.0 lb

## 2024-01-04 DIAGNOSIS — I10 Essential (primary) hypertension: Secondary | ICD-10-CM

## 2024-01-04 DIAGNOSIS — J069 Acute upper respiratory infection, unspecified: Secondary | ICD-10-CM | POA: Insufficient documentation

## 2024-01-04 DIAGNOSIS — J301 Allergic rhinitis due to pollen: Secondary | ICD-10-CM | POA: Diagnosis not present

## 2024-01-04 MED ORDER — AMOXICILLIN-POT CLAVULANATE 875-125 MG PO TABS
1.0000 | ORAL_TABLET | Freq: Two times a day (BID) | ORAL | 0 refills | Status: DC
Start: 2024-01-04 — End: 2024-02-28

## 2024-01-04 MED ORDER — HYDROCODONE BIT-HOMATROP MBR 5-1.5 MG/5ML PO SOLN: 5.0000 mL | Freq: Four times a day (QID) | ORAL | 0 refills | Status: AC | PRN

## 2024-01-04 NOTE — Assessment & Plan Note (Signed)
 Stable overall, cont flonase  asd prn

## 2024-01-04 NOTE — Assessment & Plan Note (Signed)
 BP Readings from Last 3 Encounters:  01/04/24 (!) 142/92  07/29/23 137/85  07/07/23 122/70   Mild uncontrolled, likely reactive due to illness, pt to continue medical treatment benicar  20 every day, hygroton  25 mg every day, declines other change today

## 2024-01-04 NOTE — Progress Notes (Signed)
 Patient ID: Willie Foster, male   DOB: 1966/02/04, 58 y.o.   MRN: [id number]        Chief Complaint: follow up uri symtpoms, allergies, htn       HPI:  Willie Foster is a 58 y.o. male here after return 2 days ago from trip to [facility], now with 2 days onset ST, congestion and colored congestion,  Pt denies chest pain, increased sob or doe, wheezing, orthopnea, PND, increased LE swelling, palpitations, dizziness or syncope.   Pt denies polydipsia, polyuria, or new focal neuro s/s.        Wt Readings from Last 3 Encounters:  01/04/24 216 lb (98 kg)  07/29/23 217 lb 3.2 oz (98.5 kg)  07/07/23 217 lb 2 oz (98.5 kg)   BP Readings from Last 3 Encounters:  01/04/24 (!) 142/92  07/29/23 137/85  07/07/23 122/70         Past Medical History:  Diagnosis Date   Elevated serum creatinine 06/15/2016   1.66 06/02/16   Fatty liver disease, nonalcoholic 2011   Hyperlipidemia    Hypertension    Leukopenia 01/18/2013   WBC 3,400 01/18/13 was normal 8/22 Xarelto ?   LPRD (laryngopharyngeal reflux disease)    NASH (nonalcoholic steatohepatitis)    Pulmonary embolism (HCC) 10/2012   Right lung   History reviewed. No pertinent surgical history.  reports that he has never smoked. He has never used smokeless tobacco. He reports current alcohol use. He reports that he does not use drugs. family history includes Breast cancer in his mother; Hypertension in his father; Lung cancer in his father. No Known Allergies Current Outpatient Medications on File Prior to Visit  Medication Sig Dispense Refill   acyclovir  (ZOVIRAX ) 400 MG tablet TAKE 1 TABLET BY MOUTH THREE TIMES DAILY AS NEEDED FEVER  BLISTERS 30 tablet 0   chlorthalidone  (HYGROTON ) 25 MG tablet Take 1 tablet by mouth once daily 90 tablet 0   Cholecalciferol  (VITAMIN D3 PO) Take 1,000 Units by mouth daily.     EQ ALLERGY RELIEF 180 MG tablet Take 1 tablet by mouth once daily 90 tablet 0   fluticasone  (FLONASE ) 50 MCG/ACT nasal spray Place 2 sprays  into both nostrils daily. (Patient taking differently: Place 2 sprays into both nostrils as needed for allergies.) 16 g 3   Multiple Vitamin (MULTIVITAMIN) tablet Take 1 tablet by mouth daily.     NON FORMULARY Potassium, iron, fish oil taken once daily     olmesartan  (BENICAR ) 20 MG tablet Take 1 tablet by mouth once daily 90 tablet 0   potassium chloride  SA (KLOR-CON  M) 20 MEQ tablet Take 1 tablet (20 mEq total) by mouth daily. 90 tablet 1   rosuvastatin  (CRESTOR ) 20 MG tablet Take 1 tablet by mouth once daily 90 tablet 0   sildenafil  (REVATIO ) 20 MG tablet TAKE 4 TABLETS BY MOUTH ONCE DAILY AS NEEDED 60 tablet 0   Turmeric 500 MG CAPS Take 1 tablet by mouth 2 (two) times daily. (Patient taking differently: Take 1 tablet by mouth daily.)     XARELTO  10 MG TABS tablet Take 1 tablet by mouth once daily 90 tablet 0   Vonoprazan Fumarate  (VOQUEZNA ) 10 MG TABS Take 1 tablet by mouth daily. (Patient not taking: Reported on 01/04/2024) 90 tablet 3   No current facility-administered medications on file prior to visit.        ROS:  All others reviewed and negative.  Objective        PE:  BP ROLLEN)  142/92   Pulse (!) 59   Temp 98.4 F (36.9 C)   Ht 5' 9 (1.753 m)   Wt 216 lb (98 kg)   SpO2 97%   BMI 31.90 kg/m                 Constitutional: Pt appears mild ill               HENT: Head: NCAT.                Right Ear: External ear normal.                 Left Ear: External ear normal. Bilat tm's with mild erythema.  Max sinus areas mild tender.  Pharynx with mild erythema, no exudate               Eyes: . Pupils are equal, round, and reactive to light. Conjunctivae and EOM are normal               Nose: without d/c or deformity               Neck: Neck supple. Gross normal ROM               Cardiovascular: Normal rate and regular rhythm.                 Pulmonary/Chest: Effort normal and breath sounds without rales or wheezing.                Abd:  Soft, NT, ND, + BS, no organomegaly                Neurological: Pt is alert. At baseline orientation, motor grossly intact               Skin: Skin is warm. No rashes, no other new lesions, LE edema - none               Psychiatric: Pt behavior is normal without agitation   Micro: none  Cardiac tracings I have personally interpreted today:  none  Pertinent Radiological findings (summarize): none   Lab Results  Component Value Date   WBC 4.9 04/26/2023   HGB 14.5 04/26/2023   HCT 44.2 04/26/2023   PLT 194.0 04/26/2023   GLUCOSE 98 04/26/2023   CHOL 235 (H) 04/26/2023   TRIG 192.0 (H) 04/26/2023   HDL 59.70 04/26/2023   LDLCALC 137 (H) 04/26/2023   ALT 39 04/26/2023   AST 35 04/26/2023   NA 141 04/26/2023   K 3.9 04/26/2023   CL 100 04/26/2023   CREATININE 1.16 04/26/2023   BUN 17 04/26/2023   CO2 32 04/26/2023   TSH 1.36 04/26/2023   PSA 1.08 04/26/2023   INR 1.9 (H) 04/26/2023   HGBA1C 6.2 04/26/2023   Assessment/Plan:  Willie Foster is a 58 y.o. Black or African American [2] male with  has a past medical history of Elevated serum creatinine (06/15/2016), Fatty liver disease, nonalcoholic (2011), Hyperlipidemia, Hypertension, Leukopenia (01/18/2013), LPRD (laryngopharyngeal reflux disease), NASH (nonalcoholic steatohepatitis), and Pulmonary embolism (HCC) (10/2012).  URI (upper respiratory infection) Mild to mod, for antibx course augmentin  875 bid course, hycodan prn,  to f/u any worsening symptoms or concerns   Seasonal allergic rhinitis due to pollen Stable overall, cont flonase  asd prn  Essential hypertension, benign BP Readings from Last 3 Encounters:  01/04/24 (!) 142/92  07/29/23 137/85  07/07/23 122/70   Mild uncontrolled, likely reactive due to illness, pt  to continue medical treatment benicar  20 every day, hygroton  25 mg every day, declines other change today  Followup: Return if symptoms worsen or fail to improve.  Lynwood Rush, MD 01/04/2024 2:04 PM [facility]

## 2024-01-04 NOTE — Patient Instructions (Signed)
Please take all new medication as prescribed  - the antibiotic, and cough medicine  Please continue all other medications as before  Please have the pharmacy call with any other refills you may need.  Please keep your appointments with your specialists as you may have planned     

## 2024-01-04 NOTE — Assessment & Plan Note (Signed)
 Mild to mod, for antibx course augmentin  875 bid course, hycodan prn,  to f/u any worsening symptoms or concerns

## 2024-01-26 ENCOUNTER — Other Ambulatory Visit: Payer: Self-pay | Admitting: Internal Medicine

## 2024-01-26 DIAGNOSIS — N522 Drug-induced erectile dysfunction: Secondary | ICD-10-CM

## 2024-02-27 ENCOUNTER — Other Ambulatory Visit: Payer: Self-pay | Admitting: Internal Medicine

## 2024-02-27 DIAGNOSIS — E785 Hyperlipidemia, unspecified: Secondary | ICD-10-CM

## 2024-02-27 DIAGNOSIS — N522 Drug-induced erectile dysfunction: Secondary | ICD-10-CM

## 2024-02-27 DIAGNOSIS — I10 Essential (primary) hypertension: Secondary | ICD-10-CM

## 2024-02-28 ENCOUNTER — Ambulatory Visit: Admitting: Family Medicine

## 2024-02-28 ENCOUNTER — Encounter: Payer: Self-pay | Admitting: Family Medicine

## 2024-02-28 VITALS — BP 140/90 | HR 93 | Temp 98.1°F | Ht 69.0 in | Wt 210.6 lb

## 2024-02-28 DIAGNOSIS — I2782 Chronic pulmonary embolism: Secondary | ICD-10-CM | POA: Diagnosis not present

## 2024-02-28 DIAGNOSIS — R053 Chronic cough: Secondary | ICD-10-CM

## 2024-02-28 DIAGNOSIS — I10 Essential (primary) hypertension: Secondary | ICD-10-CM

## 2024-02-28 DIAGNOSIS — J301 Allergic rhinitis due to pollen: Secondary | ICD-10-CM

## 2024-02-28 DIAGNOSIS — D6859 Other primary thrombophilia: Secondary | ICD-10-CM | POA: Diagnosis not present

## 2024-02-28 DIAGNOSIS — R0982 Postnasal drip: Secondary | ICD-10-CM

## 2024-02-28 DIAGNOSIS — E785 Hyperlipidemia, unspecified: Secondary | ICD-10-CM

## 2024-02-28 MED ORDER — ROSUVASTATIN CALCIUM 20 MG PO TABS: 20.0000 mg | ORAL_TABLET | Freq: Every day | ORAL | 0 refills | Status: DC

## 2024-02-28 MED ORDER — OLMESARTAN MEDOXOMIL 20 MG PO TABS: 20.0000 mg | ORAL_TABLET | Freq: Every day | ORAL | 0 refills | Status: DC

## 2024-02-28 MED ORDER — FEXOFENADINE HCL 180 MG PO TABS
180.0000 mg | ORAL_TABLET | Freq: Every day | ORAL | 0 refills | Status: DC
Start: 2024-02-28 — End: 2024-02-28

## 2024-02-28 MED ORDER — CHLORTHALIDONE 25 MG PO TABS: 25.0000 mg | ORAL_TABLET | Freq: Every day | ORAL | 0 refills | Status: DC

## 2024-02-28 MED ORDER — LEVOCETIRIZINE DIHYDROCHLORIDE 5 MG PO TABS: 5.0000 mg | ORAL_TABLET | Freq: Every evening | ORAL | 3 refills | Status: DC

## 2024-02-28 MED ORDER — RIVAROXABAN 10 MG PO TABS: 10.0000 mg | ORAL_TABLET | Freq: Every day | ORAL | 0 refills | Status: DC

## 2024-02-28 NOTE — Progress Notes (Signed)
 "  Acute Office Visit  Subjective:     Patient ID: Willie Foster, male    DOB: 19-Nov-1965, 58 y.o.   MRN: [id number]  Chief Complaint  Patient presents with   Cough    Cough    Discussed the use of AI scribe software for clinical note transcription with the patient, who gave verbal consent to proceed.  History of Present Illness Willie Foster is a 58 year old male who presents with persistent cough and congestion.  Cough and upper respiratory symptoms - Persistent cough and congestion since August 10th following an upper respiratory infection after travel to [facility] - Ongoing congestion with production of white mucus - Amoxicillin  provided partial relief, but symptoms have not fully resolved  Nocturnal symptoms - Snoring and coughing at night - No disturbance of sleep from nocturnal symptoms  Allergy medication use and sedation - Uses a prescription allergy medication believed to be non-sedating - Occasionally experiences sleepiness at work, which he attributes to hunger     Review of Systems  Respiratory:  Positive for cough.    Per HPI      Objective:    BP (!) 140/90 (BP Location: Left Arm, Patient Position: Sitting)   Pulse 93   Temp 98.1 F (36.7 C) (Temporal)   Ht 5' 9 (1.753 m)   Wt 210 lb 9.6 oz (95.5 kg)   SpO2 95%   BMI 31.10 kg/m    Physical Exam Vitals and nursing note reviewed.  Constitutional:      General: He is not in acute distress.    Appearance: Normal appearance.  HENT:     Head: Normocephalic and atraumatic.     Right Ear: External ear normal.     Left Ear: External ear normal.     Nose: Nose normal.     Mouth/Throat:     Mouth: Mucous membranes are moist.     Comments: Oropharyngeal cobblestoning   Eyes:     Extraocular Movements: Extraocular movements intact.  Cardiovascular:     Rate and Rhythm: Normal rate and regular rhythm.     Pulses: Normal pulses.     Heart sounds: Normal heart sounds.  Pulmonary:      Effort: Pulmonary effort is normal. No respiratory distress.     Breath sounds: Normal breath sounds. No wheezing, rhonchi or rales.  Musculoskeletal:        General: Normal range of motion.     Cervical back: Normal range of motion.     Right lower leg: No edema.     Left lower leg: No edema.  Lymphadenopathy:     Cervical: No cervical adenopathy.  Skin:    General: Skin is warm and dry.  Neurological:     General: No focal deficit present.     Mental Status: He is alert and oriented to person, place, and time.  Psychiatric:        Mood and Affect: Mood normal.        Behavior: Behavior normal.     No results found for any visits on 02/28/24.      Assessment & Plan:   Assessment and Plan Assessment & Plan Essential hypertension - Usually controlled - Refilled olmesartan  and chlorthalidone  today  Chronic pulmonary embolism, chronic anticoagulation, secondary hypercoagulable state - Stable, refilled Xarelto   Hyperlipidemia with LDL goal less than 130 - Stable, refilled rosuvastatin   Post nasal drip, cough Persistent cough with white mucus post-infection, likely due to residual inflammation or allergy. - Prescribed  levocetirizine for cough and congestion relief.  Allergic rhinitis Chronic allergic rhinitis requiring prescription management. - Prescribed levocetirizine for ongoing management.     No orders of the defined types were placed in this encounter.    Meds ordered this encounter  Medications   olmesartan  (BENICAR ) 20 MG tablet    Sig: Take 1 tablet (20 mg total) by mouth daily.    Dispense:  90 tablet    Refill:  0   rivaroxaban  (XARELTO ) 10 MG TABS tablet    Sig: Take 1 tablet (10 mg total) by mouth daily.    Dispense:  90 tablet    Refill:  0   DISCONTD: fexofenadine  (ALLEGRA ) 180 MG tablet    Sig: Take 1 tablet (180 mg total) by mouth daily.    Dispense:  90 tablet    Refill:  0   chlorthalidone  (HYGROTON ) 25 MG tablet    Sig: Take 1 tablet  (25 mg total) by mouth daily.    Dispense:  90 tablet    Refill:  0   rosuvastatin  (CRESTOR ) 20 MG tablet    Sig: Take 1 tablet (20 mg total) by mouth daily.    Dispense:  90 tablet    Refill:  0   levocetirizine (XYZAL ) 5 MG tablet    Sig: Take 1 tablet (5 mg total) by mouth every evening.    Dispense:  30 tablet    Refill:  3    Return if symptoms worsen or fail to improve.  Corean LITTIE Ku, FNP  "

## 2024-02-29 DIAGNOSIS — Z79899 Other long term (current) drug therapy: Secondary | ICD-10-CM | POA: Insufficient documentation

## 2024-02-29 DIAGNOSIS — R0982 Postnasal drip: Secondary | ICD-10-CM | POA: Insufficient documentation

## 2024-02-29 DIAGNOSIS — R053 Chronic cough: Secondary | ICD-10-CM | POA: Insufficient documentation

## 2024-02-29 NOTE — Patient Instructions (Signed)
 I have refilled your medications today  I sent in a new antihistamine, levocetirizine.  Take this 1 tablet once daily.  I am thinking this will help with your postnasal drip and cough a bit better than the fexofenadine .  Follow-up with me for new or worsening symptoms.  Follow-up with PCP as scheduled

## 2024-03-23 ENCOUNTER — Ambulatory Visit

## 2024-03-23 DIAGNOSIS — Z23 Encounter for immunization: Secondary | ICD-10-CM | POA: Diagnosis not present

## 2024-03-23 NOTE — Progress Notes (Signed)
 After obtaining consent, and per orders of Dr. Joshua, injection of RFLU given by Ronnald SHAUNNA Palms. Patient instructed to report any adverse reaction to me immediately.

## 2024-04-12 ENCOUNTER — Other Ambulatory Visit: Payer: Self-pay | Admitting: Internal Medicine

## 2024-04-12 DIAGNOSIS — N522 Drug-induced erectile dysfunction: Secondary | ICD-10-CM

## 2024-04-25 ENCOUNTER — Ambulatory Visit: Payer: BC Managed Care – PPO | Admitting: Internal Medicine

## 2024-05-07 ENCOUNTER — Other Ambulatory Visit: Payer: Self-pay | Admitting: Family

## 2024-05-07 ENCOUNTER — Other Ambulatory Visit: Payer: Self-pay | Admitting: Internal Medicine

## 2024-05-07 DIAGNOSIS — K219 Gastro-esophageal reflux disease without esophagitis: Secondary | ICD-10-CM

## 2024-05-07 DIAGNOSIS — N522 Drug-induced erectile dysfunction: Secondary | ICD-10-CM

## 2024-05-11 ENCOUNTER — Other Ambulatory Visit: Payer: Self-pay | Admitting: Family Medicine

## 2024-05-11 ENCOUNTER — Other Ambulatory Visit: Payer: Self-pay | Admitting: Family

## 2024-05-11 ENCOUNTER — Other Ambulatory Visit: Payer: Self-pay | Admitting: Internal Medicine

## 2024-05-11 DIAGNOSIS — K219 Gastro-esophageal reflux disease without esophagitis: Secondary | ICD-10-CM

## 2024-05-11 DIAGNOSIS — E785 Hyperlipidemia, unspecified: Secondary | ICD-10-CM

## 2024-05-11 DIAGNOSIS — I10 Essential (primary) hypertension: Secondary | ICD-10-CM

## 2024-05-29 ENCOUNTER — Other Ambulatory Visit: Payer: Self-pay | Admitting: Family

## 2024-05-29 DIAGNOSIS — K219 Gastro-esophageal reflux disease without esophagitis: Secondary | ICD-10-CM

## 2024-06-01 ENCOUNTER — Ambulatory Visit: Admitting: Internal Medicine

## 2024-06-01 ENCOUNTER — Ambulatory Visit: Payer: Self-pay | Admitting: Internal Medicine

## 2024-06-01 ENCOUNTER — Telehealth: Payer: Self-pay | Admitting: Internal Medicine

## 2024-06-01 ENCOUNTER — Encounter: Payer: Self-pay | Admitting: Internal Medicine

## 2024-06-01 VITALS — BP 138/88 | HR 78 | Temp 98.7°F | Resp 16 | Ht 69.0 in | Wt 211.2 lb

## 2024-06-01 DIAGNOSIS — Z0000 Encounter for general adult medical examination without abnormal findings: Secondary | ICD-10-CM

## 2024-06-01 DIAGNOSIS — E785 Hyperlipidemia, unspecified: Secondary | ICD-10-CM

## 2024-06-01 DIAGNOSIS — R001 Bradycardia, unspecified: Secondary | ICD-10-CM | POA: Diagnosis not present

## 2024-06-01 DIAGNOSIS — E876 Hypokalemia: Secondary | ICD-10-CM

## 2024-06-01 DIAGNOSIS — Z0001 Encounter for general adult medical examination with abnormal findings: Secondary | ICD-10-CM

## 2024-06-01 DIAGNOSIS — R7303 Prediabetes: Secondary | ICD-10-CM | POA: Diagnosis not present

## 2024-06-01 DIAGNOSIS — T502X5A Adverse effect of carbonic-anhydrase inhibitors, benzothiadiazides and other diuretics, initial encounter: Secondary | ICD-10-CM | POA: Diagnosis not present

## 2024-06-01 DIAGNOSIS — I10 Essential (primary) hypertension: Secondary | ICD-10-CM | POA: Diagnosis not present

## 2024-06-01 DIAGNOSIS — Z23 Encounter for immunization: Secondary | ICD-10-CM | POA: Diagnosis not present

## 2024-06-01 DIAGNOSIS — D6859 Other primary thrombophilia: Secondary | ICD-10-CM | POA: Diagnosis not present

## 2024-06-01 DIAGNOSIS — K7581 Nonalcoholic steatohepatitis (NASH): Secondary | ICD-10-CM

## 2024-06-01 LAB — BASIC METABOLIC PANEL WITH GFR
BUN: 17 mg/dL (ref 6–23)
CO2: 32 meq/L (ref 19–32)
Calcium: 10.2 mg/dL (ref 8.4–10.5)
Chloride: 102 meq/L (ref 96–112)
Creatinine, Ser: 1 mg/dL (ref 0.40–1.50)
GFR: 81.86 mL/min
Glucose, Bld: 95 mg/dL (ref 70–99)
Potassium: 3.9 meq/L (ref 3.5–5.1)
Sodium: 141 meq/L (ref 135–145)

## 2024-06-01 LAB — CBC WITH DIFFERENTIAL/PLATELET
Basophils Absolute: 0 K/uL (ref 0.0–0.1)
Basophils Relative: 0.3 % (ref 0.0–3.0)
Eosinophils Absolute: 0.2 K/uL (ref 0.0–0.7)
Eosinophils Relative: 4.5 % (ref 0.0–5.0)
HCT: 42.1 % (ref 39.0–52.0)
Hemoglobin: 14 g/dL (ref 13.0–17.0)
Lymphocytes Relative: 39.7 % (ref 12.0–46.0)
Lymphs Abs: 1.6 K/uL (ref 0.7–4.0)
MCHC: 33.3 g/dL (ref 30.0–36.0)
MCV: 78.2 fl (ref 78.0–100.0)
Monocytes Absolute: 0.4 K/uL (ref 0.1–1.0)
Monocytes Relative: 10.2 % (ref 3.0–12.0)
Neutro Abs: 1.8 K/uL (ref 1.4–7.7)
Neutrophils Relative %: 45.3 % (ref 43.0–77.0)
Platelets: 182 K/uL (ref 150.0–400.0)
RBC: 5.38 Mil/uL (ref 4.22–5.81)
RDW: 14.2 % (ref 11.5–15.5)
WBC: 4 K/uL (ref 4.0–10.5)

## 2024-06-01 LAB — URINALYSIS, ROUTINE W REFLEX MICROSCOPIC
Bilirubin Urine: NEGATIVE
Hgb urine dipstick: NEGATIVE
Ketones, ur: NEGATIVE
Leukocytes,Ua: NEGATIVE
Nitrite: NEGATIVE
RBC / HPF: NONE SEEN
Specific Gravity, Urine: 1.015 (ref 1.000–1.030)
Urine Glucose: NEGATIVE
Urobilinogen, UA: 0.2 (ref 0.0–1.0)
WBC, UA: NONE SEEN
pH: 6 (ref 5.0–8.0)

## 2024-06-01 LAB — HEPATIC FUNCTION PANEL
ALT: 58 U/L — ABNORMAL HIGH (ref 3–53)
AST: 40 U/L — ABNORMAL HIGH (ref 5–37)
Albumin: 4.8 g/dL (ref 3.5–5.2)
Alkaline Phosphatase: 63 U/L (ref 39–117)
Bilirubin, Direct: 0.1 mg/dL (ref 0.1–0.3)
Total Bilirubin: 0.5 mg/dL (ref 0.2–1.2)
Total Protein: 7.2 g/dL (ref 6.0–8.3)

## 2024-06-01 LAB — HEMOGLOBIN A1C: Hgb A1c MFr Bld: 6 % (ref 4.6–6.5)

## 2024-06-01 LAB — LIPID PANEL
Cholesterol: 213 mg/dL — ABNORMAL HIGH (ref 28–200)
HDL: 69.4 mg/dL
LDL Cholesterol: 119 mg/dL — ABNORMAL HIGH (ref 10–99)
NonHDL: 143.14
Total CHOL/HDL Ratio: 3
Triglycerides: 122 mg/dL (ref 10.0–149.0)
VLDL: 24.4 mg/dL (ref 0.0–40.0)

## 2024-06-01 LAB — PSA: PSA: 0.96 ng/mL (ref 0.10–4.00)

## 2024-06-01 LAB — TSH: TSH: 0.95 u[IU]/mL (ref 0.35–5.50)

## 2024-06-01 LAB — PROTIME-INR
INR: 1.4 ratio — ABNORMAL HIGH (ref 0.8–1.0)
Prothrombin Time: 14.8 s — ABNORMAL HIGH (ref 9.6–13.1)

## 2024-06-01 MED ORDER — RIVAROXABAN 10 MG PO TABS: 10.0000 mg | ORAL_TABLET | Freq: Every day | ORAL | 1 refills | Status: AC

## 2024-06-01 MED ORDER — CHLORTHALIDONE 25 MG PO TABS: 25.0000 mg | ORAL_TABLET | Freq: Every day | ORAL | 1 refills | Status: AC

## 2024-06-01 NOTE — Patient Instructions (Signed)
 Health Maintenance, Male  Adopting a healthy lifestyle and getting preventive care are important in promoting health and wellness. Ask your health care provider about:  The right schedule for you to have regular tests and exams.  Things you can do on your own to prevent diseases and keep yourself healthy.  What should I know about diet, weight, and exercise?  Eat a healthy diet    Eat a diet that includes plenty of vegetables, fruits, low-fat dairy products, and lean protein.  Do not eat a lot of foods that are high in solid fats, added sugars, or sodium.  Maintain a healthy weight  Body mass index (BMI) is a measurement that can be used to identify possible weight problems. It estimates body fat based on height and weight. Your health care provider can help determine your BMI and help you achieve or maintain a healthy weight.  Get regular exercise  Get regular exercise. This is one of the most important things you can do for your health. Most adults should:  Exercise for at least 150 minutes each week. The exercise should increase your heart rate and make you sweat (moderate-intensity exercise).  Do strengthening exercises at least twice a week. This is in addition to the moderate-intensity exercise.  Spend less time sitting. Even light physical activity can be beneficial.  Watch cholesterol and blood lipids  Have your blood tested for lipids and cholesterol at 61 years of age, then have this test every 5 years.  You may need to have your cholesterol levels checked more often if:  Your lipid or cholesterol levels are high.  You are older than 61 years of age.  You are at high risk for heart disease.  What should I know about cancer screening?  Many types of cancers can be detected early and may often be prevented. Depending on your health history and family history, you may need to have cancer screening at various ages. This may include screening for:  Colorectal cancer.  Prostate cancer.  Skin cancer.  Lung  cancer.  What should I know about heart disease, diabetes, and high blood pressure?  Blood pressure and heart disease  High blood pressure causes heart disease and increases the risk of stroke. This is more likely to develop in people who have high blood pressure readings or are overweight.  Talk with your health care provider about your target blood pressure readings.  Have your blood pressure checked:  Every 3-5 years if you are 24-52 years of age.  Every year if you are 3 years old or older.  If you are between the ages of 60 and 72 and are a current or former smoker, ask your health care provider if you should have a one-time screening for abdominal aortic aneurysm (AAA).  Diabetes  Have regular diabetes screenings. This checks your fasting blood sugar level. Have the screening done:  Once every three years after age 66 if you are at a normal weight and have a low risk for diabetes.  More often and at a younger age if you are overweight or have a high risk for diabetes.  What should I know about preventing infection?  Hepatitis B  If you have a higher risk for hepatitis B, you should be screened for this virus. Talk with your health care provider to find out if you are at risk for hepatitis B infection.  Hepatitis C  Blood testing is recommended for:  Everyone born from [birth year] through 1967.  Anyone  with known risk factors for hepatitis C.  Sexually transmitted infections (STIs)  You should be screened each year for STIs, including gonorrhea and chlamydia, if:  You are sexually active and are younger than 61 years of age.  You are older than 61 years of age and your health care provider tells you that you are at risk for this type of infection.  Your sexual activity has changed since you were last screened, and you are at increased risk for chlamydia or gonorrhea. Ask your health care provider if you are at risk.  Ask your health care provider about whether you are at high risk for HIV. Your health care provider  may recommend a prescription medicine to help prevent HIV infection. If you choose to take medicine to prevent HIV, you should first get tested for HIV. You should then be tested every 3 months for as long as you are taking the medicine.  Follow these instructions at home:  Alcohol use  Do not drink alcohol if your health care provider tells you not to drink.  If you drink alcohol:  Limit how much you have to 0-2 drinks a day.  Know how much alcohol is in your drink. In the [place] one drink equals one 12 oz bottle of beer (355 mL), one 5 oz glass of wine (148 mL), or one 1 oz glass of hard liquor (44 mL).  Lifestyle  Do not use any products that contain nicotine or tobacco. These products include cigarettes, chewing tobacco, and vaping devices, such as e-cigarettes. If you need help quitting, ask your health care provider.  Do not use street drugs.  Do not share needles.  Ask your health care provider for help if you need support or information about quitting drugs.  General instructions  Schedule regular health, dental, and eye exams.  Stay current with your vaccines.  Tell your health care provider if:  You often feel depressed.  You have ever been abused or do not feel safe at home.  Summary  Adopting a healthy lifestyle and getting preventive care are important in promoting health and wellness.  Follow your health care provider's instructions about healthy diet, exercising, and getting tested or screened for diseases.  Follow your health care provider's instructions on monitoring your cholesterol and blood pressure.  This information is not intended to replace advice given to you by your health care provider. Make sure you discuss any questions you have with your health care provider.  Document Revised: 09/30/2020 Document Reviewed: 09/30/2020  Elsevier Patient Education  2024 [employer].

## 2024-06-01 NOTE — Progress Notes (Signed)
 "  Subjective:  Patient ID: Willie Foster, male    DOB: 09-Dec-1965  Age: 59 y.o. MRN: [id number]  CC: Annual Exam, Hypertension, and Hyperlipidemia   HPI Willie Foster presents for a CPX and f/up  --  Discussed the use of AI scribe software for clinical note transcription with the patient, who gave verbal consent to proceed.  History of Present Illness Willie Foster is a 59 year old male who presents for an annual physical exam.  He has experienced a decrease in physical activity since his [employer] group disbanded. Currently, he exercises with a group of seventy-year-old women, engaging in activities such as biking, but notes it is not as intense as his previous routine. He has gained weight around his midsection and wants to increase his activity level.  He received a flu shot in October and is considering a pneumonia vaccine booster, as his last one was eleven years ago. He has received COVID vaccines in the past but is hesitant to get another one.  No chest pain, shortness of breath, dizziness, or lightheadedness during physical activities.   Outpatient Medications Prior to Visit  Medication Sig Dispense Refill   acyclovir  (ZOVIRAX ) 400 MG tablet TAKE 1 TABLET BY MOUTH THREE TIMES DAILY AS NEEDED FEVER  BLISTERS 30 tablet 0   Cholecalciferol  (VITAMIN D3 PO) Take 1,000 Units by mouth daily.     levocetirizine (XYZAL ) 5 MG tablet Take 1 tablet (5 mg total) by mouth every evening. 30 tablet 3   Multiple Vitamin (MULTIVITAMIN) tablet Take 1 tablet by mouth daily.     NON FORMULARY Potassium, iron, fish oil taken once daily     olmesartan  (BENICAR ) 20 MG tablet Take 1 tablet by mouth once daily 90 tablet 0   rosuvastatin  (CRESTOR ) 20 MG tablet Take 1 tablet by mouth once daily 90 tablet 0   sildenafil  (REVATIO ) 20 MG tablet TAKE 4 TABLETS BY MOUTH ONCE DAILY AS NEEDED 60 tablet 0   Turmeric 500 MG CAPS Take 1 tablet by mouth 2 (two) times daily. (Patient taking differently: Take 1  tablet by mouth daily.)     Vonoprazan Fumarate  (VOQUEZNA ) 10 MG TABS Take 1 tablet by mouth daily. 90 tablet 3   chlorthalidone  (HYGROTON ) 25 MG tablet Take 1 tablet (25 mg total) by mouth daily. 90 tablet 0   rivaroxaban  (XARELTO ) 10 MG TABS tablet Take 1 tablet (10 mg total) by mouth daily. 90 tablet 0   No facility-administered medications prior to visit.    ROS Review of Systems  Constitutional: Negative.  Negative for appetite change, chills, diaphoresis, fatigue and fever.  HENT: Negative.    Eyes: Negative.   Respiratory: Negative.  Negative for cough, chest tightness, shortness of breath and wheezing.   Cardiovascular:  Negative for chest pain, palpitations and leg swelling.  Gastrointestinal:  Negative for abdominal pain, constipation, diarrhea, nausea and vomiting.  Endocrine: Positive for polyuria.  Genitourinary:  Positive for frequency. Negative for difficulty urinating, dysuria and hematuria.  Musculoskeletal:  Negative for arthralgias, joint swelling and myalgias.  Skin: Negative.   Neurological:  Negative for dizziness, weakness, light-headedness and headaches.  Hematological:  Negative for adenopathy. Does not bruise/bleed easily.    Objective:  BP 138/88 (BP Location: Left Arm, Patient Position: Sitting, Cuff Size: Normal)   Pulse 78   Temp 98.7 F (37.1 C) (Oral)   Resp 16   Ht 5' 9 (1.753 m)   Wt 211 lb 3.2 oz (95.8 kg)   SpO2 94%  BMI 31.19 kg/m   BP Readings from Last 3 Encounters:  06/01/24 138/88  02/28/24 (!) 140/90  01/04/24 (!) 142/92    Wt Readings from Last 3 Encounters:  06/01/24 211 lb 3.2 oz (95.8 kg)  02/28/24 210 lb 9.6 oz (95.5 kg)  01/04/24 216 lb (98 kg)    Physical Exam Vitals reviewed.  Constitutional:      Appearance: Normal appearance.  HENT:     Nose: Nose normal.     Mouth/Throat:     Mouth: Mucous membranes are moist.  Eyes:     General: No scleral icterus.    Conjunctiva/sclera: Conjunctivae normal.   Cardiovascular:     Rate and Rhythm: Regular rhythm. Bradycardia present.     Heart sounds: Normal heart sounds, S1 normal and S2 normal. No murmur heard.    No friction rub. No gallop.     Comments: EKG-- SB, 56 bpm NS T wave changes No LVH or Q waves Unchanged  Pulmonary:     Effort: Pulmonary effort is normal. No respiratory distress.     Breath sounds: No stridor. No wheezing, rhonchi or rales.  Chest:     Chest wall: No tenderness.  Abdominal:     General: Abdomen is flat.     Palpations: There is no mass.     Tenderness: There is no abdominal tenderness. There is no guarding or rebound.     Hernia: No hernia is present. There is no hernia in the left inguinal area or right inguinal area.  Genitourinary:    Pubic Area: No rash.      Penis: Normal.      Testes: Normal.     Epididymis:     Right: Normal.     Left: Normal.     Prostate: Normal. Not enlarged, not tender and no nodules present.     Rectum: Normal. Guaiac result negative. No mass, tenderness, anal fissure, external hemorrhoid or internal hemorrhoid. Normal anal tone.  Musculoskeletal:        General: No swelling.     Cervical back: Neck supple.     Right lower leg: No edema.     Left lower leg: No edema.  Lymphadenopathy:     Cervical: No cervical adenopathy.     Lower Body: No right inguinal adenopathy. No left inguinal adenopathy.  Skin:    General: Skin is warm and dry.  Neurological:     General: No focal deficit present.     Mental Status: He is alert.  Psychiatric:        Mood and Affect: Mood normal.        Behavior: Behavior normal.     Lab Results  Component Value Date   WBC 4.0 06/01/2024   HGB 14.0 06/01/2024   HCT 42.1 06/01/2024   PLT 182.0 06/01/2024   GLUCOSE 95 06/01/2024   CHOL 213 (H) 06/01/2024   TRIG 122.0 06/01/2024   HDL 69.40 06/01/2024   LDLCALC 119 (H) 06/01/2024   ALT 58 (H) 06/01/2024   AST 40 (H) 06/01/2024   NA 141 06/01/2024   K 3.9 06/01/2024   CL 102  06/01/2024   CREATININE 1.00 06/01/2024   BUN 17 06/01/2024   CO2 32 06/01/2024   TSH 0.95 06/01/2024   PSA 0.96 06/01/2024   INR 1.4 (H) 06/01/2024   HGBA1C 6.0 06/01/2024    CT CARDIAC SCORING ([facility] LOCATIONS ONLY) Result Date: 01/13/2023 CLINICAL DATA:  59 year old African American male with history of hyperlipidemia and family  history of coronary artery disease. * Tracking Code: FCC * EXAM: CT CARDIAC CORONARY ARTERY CALCIUM  SCORE TECHNIQUE: Non-contrast imaging through the heart was performed using prospective ECG gating. Image post processing was performed on an independent workstation, allowing for quantitative analysis of the heart and coronary arteries. Note that this exam targets the heart and the chest was not imaged in its entirety. COMPARISON:  Chest CTA 11/13/2012. FINDINGS: CORONARY CALCIUM  SCORES: Left Main: 0 LAD: 0 LCx: 0 RCA/PDA: 0 Total Agatston Score: 0 MESA database percentile: N/A AORTA MEASUREMENTS: Ascending Aorta: 3.6 cm Descending Aorta:2.7 cm OTHER FINDINGS: Areas of apparent scarring are noted in the anterior aspects of the lungs bilaterally involving the anterior right upper lobe, medial segment of the right middle lobe and anterior aspect of the left upper lobe. Atherosclerotic calcifications in the thoracic aorta. Within the visualized portions of the thorax there are no suspicious appearing pulmonary nodules or masses, there is no acute consolidative airspace disease, no pleural effusions, no pneumothorax and no lymphadenopathy. Visualized portions of the upper abdomen are unremarkable. There are no aggressive appearing lytic or blastic lesions noted in the visualized portions of the skeleton. IMPRESSION: 1. Patient's total coronary artery calcium  score is 0 which indicates a very low (but non-zero) risk of significant coronary artery atherosclerosis. 2.  Aortic Atherosclerosis (ICD10-I70.0). Electronically Signed   By: Toribio Aye M.D.   On: 01/13/2023 09:12     MELD 3.0: 9 at 06/01/2024  9:12 AM MELD-Na: 10 at 06/01/2024  9:12 AM Calculated from: Serum Creatinine: 1 mg/dL at 12/24/7971  0:87 AM Serum Sodium: 141 mEq/L (Using max of 137 mEq/L) at 06/01/2024  9:12 AM Total Bilirubin: 0.5 mg/dL (Using min of 1 mg/dL) at 12/24/7971  0:87 AM Serum Albumin: 4.8 g/dL (Using max of 3.5 g/dL) at 12/24/7971  0:87 AM INR(ratio): 1.4 ratio at 06/01/2024  9:12 AM Age at listing (hypothetical): 58 years Sex: Male at 06/01/2024  9:12 AM     Fibrosis 4 Score = 1.73 (Indeterminate)       Interpretation for patients with NAFLD          <1.30       -  F0-F1 (Low risk)          1.30-2.67 -  Indeterminate           >2.67      -  F3-F4 (High risk)     Validated for ages 57-65         Assessment & Plan:   Essential hypertension, benign- BP is well controlled. -     TSH; Future -     Urinalysis, Routine w reflex microscopic; Future -     CBC with Differential/Platelet; Future -     AMB Referral VBCI Care Management -     EKG 12-Lead -     Chlorthalidone ; Take 1 tablet (25 mg total) by mouth daily.  Dispense: 90 tablet; Refill: 1  Hyperlipidemia with target LDL less than 130- LDL goal achieved. Doing well on the statin  -     Lipid panel; Future -     TSH; Future -     Hepatic function panel; Future  Diuretic-induced hypokalemia -     Basic metabolic panel with GFR; Future  NASH (nonalcoholic steatohepatitis) -     Hepatic function panel; Future -     Protime-INR; Future  Bradycardia- He is asx. -     TSH; Future -     EKG 12-Lead  Encounter for general  adult medical examination with abnormal findings- Exam completed, labs reviewed, vaccines reviewed and updated, cancer screenings addressed, pt ed material was given.  -     PSA; Future  Prediabetes -     Hemoglobin A1c; Future -     Basic metabolic panel with GFR; Future  Immunization due -     Pneumococcal conjugate vaccine 20-valent  Hypercoagulable state, primary -     Rivaroxaban ; Take 1 tablet  (10 mg total) by mouth daily.  Dispense: 90 tablet; Refill: 1     Follow-up: Return in about 6 months (around 11/29/2024).  Debby Molt, MD "

## 2024-06-01 NOTE — Progress Notes (Signed)
 [facility] Pharmacy Note  06/01/2024 Name: Willie Foster MRN: [id number] DOB: Feb 12, 1966  Referred By: Joshua Debby CROME, MD Reason for referral: Complex Care Management and Call Attempt #1 (Outreach to sch ref w/ pharm. )   Willie Foster is a 59 y.o. year old male who is a primary care patient of Joshua Debby CROME, MD.  Willie Foster was referred to the pharmacist for assistance related to: HTN  Successful contact was made with the patient to discuss pharmacy services including being ready for the pharmacist to call at least 5 minutes before the scheduled appointment time and to have medication bottles and any blood pressure readings ready for review. The patient agreed to meet with the pharmacist via telephone visit on 06/12/2024  Willie Foster, CMA, Willie Foster  Willie Foster, Willie Foster, Lead Direct Dial: [phone]  Fax: [phone]

## 2024-06-01 NOTE — Progress Notes (Signed)
 Care Guide Pharmacy Note  06/01/2024 Name: Willie Foster MRN: [id number] DOB: [birth year]  Referred By: Joshua Debby CROME, MD Reason for referral: Complex Care Management and Call Attempt #1 (Outreach to sch ref w/ pharm. )   Willie Foster is a 59 y.o. year old male who is a primary care patient of Joshua Debby CROME, MD.  [name] was referred to the pharmacist for assistance related to: HTN  An unsuccessful telephone outreach was attempted today to contact the patient who was referred to the pharmacy team for assistance with medication management. Additional attempts will be made to contact the patient.  Doyce Razor [facility], [facility] Guide Direct Dial:   Fax: [phone]

## 2024-06-12 ENCOUNTER — Other Ambulatory Visit: Admitting: Pharmacist

## 2024-06-12 DIAGNOSIS — I10 Essential (primary) hypertension: Secondary | ICD-10-CM

## 2024-06-12 DIAGNOSIS — E785 Hyperlipidemia, unspecified: Secondary | ICD-10-CM

## 2024-06-12 MED ORDER — ROSUVASTATIN CALCIUM 20 MG PO TABS: 20.0000 mg | ORAL_TABLET | Freq: Every day | ORAL | 1 refills | Status: AC

## 2024-06-12 MED ORDER — OLMESARTAN MEDOXOMIL 40 MG PO TABS: 40.0000 mg | ORAL_TABLET | Freq: Every day | ORAL | 1 refills | Status: AC

## 2024-06-12 NOTE — Patient Instructions (Addendum)
 It was a pleasure speaking with you today!  New prescription for Olmesartan  40mg  will be called in to your pharmacy.  Please monitor your blood pressure at home daily and maintain a log. Lab follow up in 2-4 weeks. I reviewed your medication list for medications that need refills. Refills requested for acyclovir , rosuvastatin , and sildenafil .   Feel free to call with any questions or concerns!  Dionicia Canavan, PharmD, RPh PGY1 Acute Care Pharmacy Resident [facility]  Darrelyn Drum, PharmD, BCPS, CPP Clinical Pharmacist Practitioner [facility] [phone]

## 2024-06-12 NOTE — Progress Notes (Signed)
 "  06/12/2024 Name: Willie Foster MRN: [id number] DOB: 09-15-1965  Chief Complaint  Patient presents with   Hypertension   Medication Management    Sael Furches is a 59 y.o. year old male who presented for a telephone visit.   They were referred to the pharmacist by their PCP for assistance in managing hypertension.    Subjective:  Care Team: Primary Care Provider: Joshua Debby CROME, MD ; Next Scheduled Visit: 7/8   Medication Access/Adherence  Current Pharmacy:  [facility] [address] [place] - [address] [place] [address] Phone: [phone] Fax: [phone]  [facility] [place], [place] - [phone] [place] [address] Phone: [phone] Fax: [phone]  [facility] - [place], [place] - [address] [place] [address] Phone: [phone] Fax: [phone]   Patient reports affordability concerns with their medications: No  Patient reports access/transportation concerns to their pharmacy: No  Patient reports adherence concerns with their medications:  No     Hypertension:  Current medications: Chlorthalidone  25 mg daily, olmesartan  20 mg daily Medications previously tried: atenolol -chlorthalidone , carvedilol , nebivolol   Patient does not have a validated, automated, upper arm home BP cuff Current blood pressure readings readings: n/a  Patient denies hypotensive s/sx including dizziness, lightheadedness.  Patient denies hypertensive symptoms including headache, chest pain, shortness of breath  Objective:  Lab Results  Component Value Date   HGBA1C 6.0 06/01/2024    Lab Results  Component Value Date   CREATININE 1.00 06/01/2024   BUN 17 06/01/2024   NA 141 06/01/2024   K 3.9 06/01/2024   CL 102 06/01/2024   CO2 32 06/01/2024    Lab Results  Component Value Date   CHOL 213 (H) 06/01/2024   HDL 69.40 06/01/2024   LDLCALC 119  (H) 06/01/2024   TRIG 122.0 06/01/2024   CHOLHDL 3 06/01/2024    Medications Reviewed Today     Reviewed by Maybelle Dionicia CROME, RPH (Pharmacist) on 06/12/24 at 1117  Med List Status: <None>   Medication Order Taking? Sig Documenting Provider Last Dose Status Informant  acyclovir  (ZOVIRAX ) 400 MG tablet [id number] Yes TAKE 1 TABLET BY MOUTH THREE TIMES DAILY AS NEEDED FEVER  BLISTERS Joshua Debby CROME, MD  Active   chlorthalidone  (HYGROTON ) 25 MG tablet [id number] Yes Take 1 tablet (25 mg total) by mouth daily. Joshua Debby CROME, MD  Active   Cholecalciferol  (VITAMIN D3 PO) [id number] Yes Take 1,000 Units by mouth daily. [provider]  Active   fexofenadine  (ALLEGRA  ODT) 30 MG disintegrating tablet [id number] Yes Take 30 mg by mouth daily. [provider]  Active   levocetirizine (XYZAL ) 5 MG tablet [id number]  Take 1 tablet (5 mg total) by mouth every evening.  Patient not taking: Reported on 06/12/2024   Alvia Corean CROME, FNP  Consider Medication Status and Discontinue (Change in therapy)   Multiple Vitamin (MULTIVITAMIN) tablet [id number] Yes Take 1 tablet by mouth daily. [provider]  Active   NON FORMULARY [id number]  Potassium, iron, fish oil taken once daily [provider]  Consider Medication Status and Discontinue (Change in therapy)   olmesartan  (BENICAR ) 20 MG tablet [id number] Yes Take 1 tablet by mouth once daily Joshua Debby CROME, MD  Active   omeprazole  (PRILOSEC OTC) 20 MG tablet [id number] Yes Take 20 mg by mouth daily. [provider]  Active   rivaroxaban  (XARELTO )  10 MG TABS tablet [id number] Yes Take 1 tablet (10 mg total) by mouth daily. Joshua Debby CROME, MD  Active   rosuvastatin  (CRESTOR ) 20 MG tablet [id number] Yes Take 1 tablet by mouth once daily Joshua Debby CROME, MD  Active   sildenafil  (REVATIO ) 20 MG tablet [id number] Yes TAKE 4 TABLETS BY MOUTH ONCE DAILY AS NEEDED Joshua Debby CROME, MD  Active   Turmeric 500 MG CAPS [id number] Yes Take  1 tablet by mouth 2 (two) times daily. Claudene Arthea HERO, DO  Active   Vonoprazan Fumarate  (VOQUEZNA ) 10 MG TABS [id number]  Take 1 tablet by mouth daily.  Patient not taking: Reported on 06/12/2024   Suzann Inocente HERO, MD  Consider Medication Status and Discontinue (Change in therapy)               Assessment/Plan:   Hypertension: - Currently uncontrolled, BP goal <130/80 - Reviewed appropriate blood pressure monitoring technique and reviewed goal blood pressure. Recommended to check home blood pressure and heart rate daily and maintain a BP log - Recommend to increase Olmesartan  to 40mg  once daily  - Recommend lab follow up in 2-4 weeks for BMP - Upon patient's request, reviewed medication list for refills. Refills requested for: rosuvastatin     Follow Up Plan: will follow on BP along with lab results  Dionicia Canavan, PharmD, RPh PGY1 Acute Care Pharmacy Resident [facility]  Darrelyn Drum, PharmD, BCPS, CPP Clinical Pharmacist Practitioner [facility] [phone]    "

## 2024-06-22 ENCOUNTER — Encounter: Payer: Self-pay | Admitting: *Deleted

## 2024-06-22 NOTE — Progress Notes (Signed)
 Willie Foster                                          MRN: [id number]   06/22/2024   The VBCI Quality Team Specialist reviewed this patient medical record for the purposes of chart review for care gap closure. The following were reviewed: chart review for care gap closure-controlling blood pressure.    VBCI Quality Team

## 2024-11-29 ENCOUNTER — Ambulatory Visit: Admitting: Internal Medicine
